# Patient Record
Sex: Female | Born: 1950 | Race: White | Hispanic: No | Marital: Married | Smoking: Never smoker
Health system: Southern US, Community
[De-identification: ages and names within clinical notes are randomized; demographics above are authoritative.]

## PROBLEM LIST (undated history)

## (undated) DIAGNOSIS — M549 Dorsalgia, unspecified: Secondary | ICD-10-CM

## (undated) DIAGNOSIS — F32A Depression, unspecified: Secondary | ICD-10-CM

## (undated) DIAGNOSIS — F514 Sleep terrors [night terrors]: Secondary | ICD-10-CM

## (undated) DIAGNOSIS — R600 Localized edema: Secondary | ICD-10-CM

## (undated) DIAGNOSIS — M810 Age-related osteoporosis without current pathological fracture: Secondary | ICD-10-CM

## (undated) DIAGNOSIS — J189 Pneumonia, unspecified organism: Secondary | ICD-10-CM

## (undated) DIAGNOSIS — F429 Obsessive-compulsive disorder, unspecified: Secondary | ICD-10-CM

## (undated) DIAGNOSIS — Z86711 Personal history of pulmonary embolism: Secondary | ICD-10-CM

## (undated) DIAGNOSIS — M1990 Unspecified osteoarthritis, unspecified site: Secondary | ICD-10-CM

## (undated) DIAGNOSIS — M5030 Other cervical disc degeneration, unspecified cervical region: Secondary | ICD-10-CM

## (undated) DIAGNOSIS — I82409 Acute embolism and thrombosis of unspecified deep veins of unspecified lower extremity: Secondary | ICD-10-CM

## (undated) DIAGNOSIS — I10 Essential (primary) hypertension: Secondary | ICD-10-CM

## (undated) DIAGNOSIS — R0600 Dyspnea, unspecified: Secondary | ICD-10-CM

## (undated) DIAGNOSIS — F419 Anxiety disorder, unspecified: Secondary | ICD-10-CM

## (undated) DIAGNOSIS — R51 Headache: Secondary | ICD-10-CM

## (undated) DIAGNOSIS — F329 Major depressive disorder, single episode, unspecified: Secondary | ICD-10-CM

## (undated) DIAGNOSIS — F319 Bipolar disorder, unspecified: Secondary | ICD-10-CM

## (undated) DIAGNOSIS — E538 Deficiency of other specified B group vitamins: Secondary | ICD-10-CM

## (undated) DIAGNOSIS — K5792 Diverticulitis of intestine, part unspecified, without perforation or abscess without bleeding: Secondary | ICD-10-CM

## (undated) DIAGNOSIS — I2510 Atherosclerotic heart disease of native coronary artery without angina pectoris: Secondary | ICD-10-CM

## (undated) DIAGNOSIS — Z9289 Personal history of other medical treatment: Secondary | ICD-10-CM

## (undated) DIAGNOSIS — M25569 Pain in unspecified knee: Secondary | ICD-10-CM

## (undated) DIAGNOSIS — E119 Type 2 diabetes mellitus without complications: Secondary | ICD-10-CM

## (undated) DIAGNOSIS — S8290XK Unspecified fracture of unspecified lower leg, subsequent encounter for closed fracture with nonunion: Secondary | ICD-10-CM

## (undated) DIAGNOSIS — J449 Chronic obstructive pulmonary disease, unspecified: Secondary | ICD-10-CM

## (undated) DIAGNOSIS — Z8489 Family history of other specified conditions: Secondary | ICD-10-CM

## (undated) DIAGNOSIS — Z8619 Personal history of other infectious and parasitic diseases: Secondary | ICD-10-CM

## (undated) DIAGNOSIS — K5790 Diverticulosis of intestine, part unspecified, without perforation or abscess without bleeding: Secondary | ICD-10-CM

## (undated) DIAGNOSIS — F4310 Post-traumatic stress disorder, unspecified: Secondary | ICD-10-CM

## (undated) DIAGNOSIS — R519 Headache, unspecified: Secondary | ICD-10-CM

## (undated) DIAGNOSIS — R42 Dizziness and giddiness: Secondary | ICD-10-CM

## (undated) HISTORY — PX: TOOTH EXTRACTION: SUR596

## (undated) HISTORY — PX: CERVICAL LAMINOPLASTY: SHX1333

## (undated) HISTORY — PX: KNEE ARTHROSCOPY: SUR90

## (undated) HISTORY — PX: FRACTURE SURGERY: SHX138

## (undated) HISTORY — PX: IVC FILTER PLACEMENT (ARMC HX): HXRAD1551

## (undated) HISTORY — PX: DILATION AND CURETTAGE OF UTERUS: SHX78

## (undated) HISTORY — PX: CHOLECYSTECTOMY: SHX55

## (undated) HISTORY — PX: NECK SURGERY: SHX720

---

## 2002-09-14 HISTORY — PX: CARDIAC CATHETERIZATION: SHX172

## 2002-09-14 HISTORY — PX: GASTRIC BYPASS: SHX52

## 2008-05-30 ENCOUNTER — Emergency Department: Payer: Self-pay | Admitting: Emergency Medicine

## 2008-05-30 ENCOUNTER — Other Ambulatory Visit: Payer: Self-pay

## 2010-08-26 ENCOUNTER — Emergency Department: Payer: Self-pay | Admitting: Internal Medicine

## 2010-09-12 ENCOUNTER — Encounter
Admission: RE | Admit: 2010-09-12 | Discharge: 2010-09-12 | Payer: Self-pay | Source: Home / Self Care | Attending: Orthopedic Surgery | Admitting: Orthopedic Surgery

## 2011-01-09 ENCOUNTER — Ambulatory Visit: Payer: Self-pay | Admitting: Orthopedic Surgery

## 2011-07-29 ENCOUNTER — Emergency Department: Payer: Self-pay | Admitting: Emergency Medicine

## 2011-08-05 ENCOUNTER — Emergency Department: Payer: Self-pay | Admitting: Emergency Medicine

## 2011-08-26 ENCOUNTER — Emergency Department: Payer: Self-pay | Admitting: Emergency Medicine

## 2011-10-27 ENCOUNTER — Emergency Department: Payer: Self-pay | Admitting: Emergency Medicine

## 2011-10-27 LAB — COMPREHENSIVE METABOLIC PANEL
Albumin: 3.6 g/dL (ref 3.4–5.0)
Alkaline Phosphatase: 112 U/L (ref 50–136)
BUN: 23 mg/dL — ABNORMAL HIGH (ref 7–18)
Calcium, Total: 8.6 mg/dL (ref 8.5–10.1)
Creatinine: 1.06 mg/dL (ref 0.60–1.30)
EGFR (Non-African Amer.): 57 — ABNORMAL LOW
Glucose: 129 mg/dL — ABNORMAL HIGH (ref 65–99)
Osmolality: 279 (ref 275–301)
Sodium: 137 mmol/L (ref 136–145)

## 2011-10-27 LAB — CBC
MCH: 32.5 pg (ref 26.0–34.0)
MCHC: 33.9 g/dL (ref 32.0–36.0)
MCV: 96 fL (ref 80–100)
Platelet: 239 10*3/uL (ref 150–440)
RDW: 12.7 % (ref 11.5–14.5)
WBC: 6.9 10*3/uL (ref 3.6–11.0)

## 2011-10-27 LAB — URINALYSIS, COMPLETE
Bacteria: NONE SEEN
Blood: NEGATIVE
Glucose,UR: NEGATIVE mg/dL (ref 0–75)
Leukocyte Esterase: NEGATIVE
Nitrite: NEGATIVE
RBC,UR: NONE SEEN /HPF (ref 0–5)

## 2011-10-27 LAB — VALPROIC ACID LEVEL: Valproic Acid: 38 ug/mL — ABNORMAL LOW

## 2011-10-27 LAB — TROPONIN I
Troponin-I: <0.02 ng/mL
Troponin-I: <0.02 ng/mL

## 2012-01-21 ENCOUNTER — Other Ambulatory Visit: Payer: Self-pay | Admitting: Orthopedic Surgery

## 2012-01-21 DIAGNOSIS — M545 Low back pain: Secondary | ICD-10-CM

## 2012-01-28 ENCOUNTER — Ambulatory Visit
Admission: RE | Admit: 2012-01-28 | Discharge: 2012-01-28 | Disposition: A | Discharge status: Home / Self Care | Payer: Medicare Other | Source: Ambulatory Visit | Attending: Orthopedic Surgery | Admitting: Orthopedic Surgery

## 2012-01-28 DIAGNOSIS — M545 Low back pain: Secondary | ICD-10-CM

## 2012-06-03 ENCOUNTER — Ambulatory Visit: Payer: Self-pay | Admitting: Bariatrics

## 2012-06-07 ENCOUNTER — Ambulatory Visit: Payer: Self-pay | Admitting: Bariatrics

## 2012-06-07 LAB — COMPREHENSIVE METABOLIC PANEL
Bilirubin,Total: 0.4 mg/dL (ref 0.2–1.0)
Calcium, Total: 9.1 mg/dL (ref 8.5–10.1)
Chloride: 106 mmol/L (ref 98–107)
Co2: 27 mmol/L (ref 21–32)
Creatinine: 0.93 mg/dL (ref 0.60–1.30)
EGFR (African American): >60
EGFR (Non-African Amer.): >60
Osmolality: 286 (ref 275–301)
SGPT (ALT): 34 U/L (ref 12–78)
Sodium: 142 mmol/L (ref 136–145)

## 2012-06-07 LAB — IRON AND TIBC
Iron Saturation: 31 %
Iron: 97 ug/dL (ref 50–170)
Unbound Iron-Bind.Cap.: 221 ug/dL

## 2012-06-07 LAB — PROTIME-INR: INR: 1

## 2012-06-07 LAB — CBC WITH DIFFERENTIAL/PLATELET
Basophil #: 0 10*3/uL (ref 0.0–0.1)
Basophil %: 0.5 %
Lymphocyte %: 22.6 %
Monocyte %: 5.3 %
Neutrophil #: 5.5 10*3/uL (ref 1.4–6.5)
Neutrophil %: 70.9 %
Platelet: 310 10*3/uL (ref 150–440)
RBC: 4.01 10*6/uL (ref 3.80–5.20)
WBC: 7.8 10*3/uL (ref 3.6–11.0)

## 2012-06-07 LAB — FERRITIN: Ferritin (ARMC): 97 ng/mL (ref 8–388)

## 2012-06-07 LAB — APTT: Activated PTT: 29.4 secs (ref 23.6–35.9)

## 2012-06-07 LAB — HEMOGLOBIN A1C: Hemoglobin A1C: 7.2 % — ABNORMAL HIGH (ref 4.2–6.3)

## 2012-06-07 LAB — TSH: Thyroid Stimulating Horm: 4.24 u[IU]/mL

## 2012-06-07 LAB — AMYLASE: Amylase: 41 U/L (ref 25–115)

## 2012-06-07 LAB — PHOSPHORUS: Phosphorus: 3.4 mg/dL (ref 2.5–4.9)

## 2012-07-06 ENCOUNTER — Other Ambulatory Visit: Payer: Self-pay | Admitting: Pain Medicine

## 2012-07-06 ENCOUNTER — Ambulatory Visit: Payer: Self-pay | Admitting: Pain Medicine

## 2012-07-20 ENCOUNTER — Ambulatory Visit: Payer: Self-pay | Admitting: Bariatrics

## 2012-08-01 ENCOUNTER — Ambulatory Visit: Payer: Self-pay | Admitting: Pain Medicine

## 2012-08-14 ENCOUNTER — Ambulatory Visit: Payer: Self-pay | Admitting: Bariatrics

## 2012-09-14 HISTORY — PX: LAMINECTOMY: SHX219

## 2012-09-28 ENCOUNTER — Other Ambulatory Visit: Payer: Self-pay | Admitting: Pain Medicine

## 2012-09-28 ENCOUNTER — Other Ambulatory Visit: Payer: Self-pay | Admitting: Psychiatry

## 2012-09-28 LAB — HEPATIC FUNCTION PANEL A (ARMC)
Albumin: 4.1 g/dL (ref 3.4–5.0)
Bilirubin, Direct: 0.1 mg/dL (ref 0.00–0.20)
Bilirubin,Total: 0.8 mg/dL (ref 0.2–1.0)
SGOT(AST): 51 U/L — ABNORMAL HIGH (ref 15–37)
SGPT (ALT): 42 U/L (ref 12–78)
Total Protein: 7.6 g/dL (ref 6.4–8.2)

## 2012-09-28 LAB — TSH: Thyroid Stimulating Horm: 3.17 u[IU]/mL

## 2012-10-03 ENCOUNTER — Ambulatory Visit: Payer: Self-pay | Admitting: Pain Medicine

## 2012-10-24 ENCOUNTER — Other Ambulatory Visit: Payer: Self-pay | Admitting: Pain Medicine

## 2012-10-31 ENCOUNTER — Ambulatory Visit: Payer: Self-pay | Admitting: Pain Medicine

## 2013-01-23 ENCOUNTER — Ambulatory Visit: Payer: Self-pay | Admitting: Pain Medicine

## 2013-02-20 DIAGNOSIS — F319 Bipolar disorder, unspecified: Secondary | ICD-10-CM | POA: Diagnosis present

## 2013-02-20 DIAGNOSIS — M5090 Cervical disc disorder, unspecified, unspecified cervical region: Secondary | ICD-10-CM | POA: Insufficient documentation

## 2013-02-20 DIAGNOSIS — E119 Type 2 diabetes mellitus without complications: Secondary | ICD-10-CM

## 2013-02-20 DIAGNOSIS — E669 Obesity, unspecified: Secondary | ICD-10-CM | POA: Insufficient documentation

## 2013-02-20 DIAGNOSIS — Z9884 Bariatric surgery status: Secondary | ICD-10-CM | POA: Insufficient documentation

## 2013-02-20 DIAGNOSIS — F1190 Opioid use, unspecified, uncomplicated: Secondary | ICD-10-CM | POA: Insufficient documentation

## 2013-05-02 ENCOUNTER — Other Ambulatory Visit: Payer: Self-pay | Admitting: Physician Assistant

## 2013-05-02 DIAGNOSIS — M5416 Radiculopathy, lumbar region: Secondary | ICD-10-CM

## 2013-05-16 ENCOUNTER — Ambulatory Visit
Admission: RE | Admit: 2013-05-16 | Discharge: 2013-05-16 | Disposition: A | Discharge status: Home / Self Care | Payer: Medicare Other | Source: Ambulatory Visit | Attending: Physician Assistant | Admitting: Physician Assistant

## 2013-05-16 DIAGNOSIS — M5416 Radiculopathy, lumbar region: Secondary | ICD-10-CM

## 2013-06-22 ENCOUNTER — Other Ambulatory Visit: Payer: Self-pay | Admitting: Physician Assistant

## 2013-06-22 DIAGNOSIS — IMO0002 Reserved for concepts with insufficient information to code with codable children: Secondary | ICD-10-CM

## 2013-07-01 ENCOUNTER — Ambulatory Visit
Admission: RE | Admit: 2013-07-01 | Discharge: 2013-07-01 | Disposition: A | Discharge status: Home / Self Care | Payer: Medicare Other | Source: Ambulatory Visit | Attending: Physician Assistant | Admitting: Physician Assistant

## 2013-07-01 DIAGNOSIS — IMO0002 Reserved for concepts with insufficient information to code with codable children: Secondary | ICD-10-CM

## 2013-07-01 MED ORDER — GADOBENATE DIMEGLUMINE 529 MG/ML IV SOLN
20.0000 mL | Freq: Once | INTRAVENOUS | Status: AC | PRN
  Administered 2013-07-01: 20 mL via INTRAVENOUS

## 2013-09-14 HISTORY — PX: JOINT REPLACEMENT: SHX530

## 2013-10-09 ENCOUNTER — Emergency Department: Payer: Self-pay | Admitting: Emergency Medicine

## 2013-10-22 ENCOUNTER — Emergency Department: Payer: Self-pay | Admitting: Emergency Medicine

## 2013-10-22 LAB — DRUG SCREEN, URINE

## 2013-10-22 LAB — URINALYSIS, COMPLETE
BLOOD: NEGATIVE
Bacteria: NEGATIVE
Bilirubin,UR: NEGATIVE
Glucose,UR: NEGATIVE mg/dL (ref 0–75)
Ketone: NEGATIVE
Leukocyte Esterase: NEGATIVE
Nitrite: NEGATIVE
PH: 6 (ref 4.5–8.0)
Protein: NEGATIVE
RBC, UR: NONE SEEN /HPF (ref 0–5)
Specific Gravity: 1.013 (ref 1.003–1.030)

## 2013-10-22 LAB — COMPREHENSIVE METABOLIC PANEL
Albumin: 3.3 g/dL — ABNORMAL LOW (ref 3.4–5.0)
Alkaline Phosphatase: 145 U/L — ABNORMAL HIGH
Anion Gap: 6 — ABNORMAL LOW (ref 7–16)
BUN: 14 mg/dL (ref 7–18)
Bilirubin,Total: 0.2 mg/dL (ref 0.2–1.0)
CREATININE: 0.91 mg/dL (ref 0.60–1.30)
Calcium, Total: 8.6 mg/dL (ref 8.5–10.1)
Chloride: 100 mmol/L (ref 98–107)
Co2: 27 mmol/L (ref 21–32)
EGFR (African American): >60
EGFR (Non-African Amer.): >60
Glucose: 109 mg/dL — ABNORMAL HIGH (ref 65–99)
Osmolality: 267 (ref 275–301)
Potassium: 4.1 mmol/L (ref 3.5–5.1)
SGOT(AST): 29 U/L (ref 15–37)
SGPT (ALT): 22 U/L (ref 12–78)
Sodium: 133 mmol/L — ABNORMAL LOW (ref 136–145)
Total Protein: 7.3 g/dL (ref 6.4–8.2)

## 2013-10-22 LAB — CBC
HCT: 39.6 % (ref 35.0–47.0)
HGB: 13.2 g/dL (ref 12.0–16.0)
MCH: 31.4 pg (ref 26.0–34.0)
MCHC: 33.3 g/dL (ref 32.0–36.0)
MCV: 94 fL (ref 80–100)
Platelet: 229 10*3/uL (ref 150–440)
RBC: 4.21 10*6/uL (ref 3.80–5.20)
RDW: 13.2 % (ref 11.5–14.5)
WBC: 8.5 10*3/uL (ref 3.6–11.0)

## 2013-10-24 ENCOUNTER — Other Ambulatory Visit: Payer: Self-pay | Admitting: Physician Assistant

## 2013-10-24 DIAGNOSIS — IMO0002 Reserved for concepts with insufficient information to code with codable children: Secondary | ICD-10-CM

## 2013-10-31 ENCOUNTER — Other Ambulatory Visit: Payer: Medicare Other

## 2013-11-13 ENCOUNTER — Ambulatory Visit: Payer: Self-pay | Admitting: Pain Medicine

## 2013-11-16 ENCOUNTER — Other Ambulatory Visit: Payer: Medicare Other

## 2013-11-28 ENCOUNTER — Ambulatory Visit
Admission: RE | Admit: 2013-11-28 | Discharge: 2013-11-28 | Disposition: A | Discharge status: Home / Self Care | Payer: Medicare Other | Source: Ambulatory Visit | Attending: Physician Assistant | Admitting: Physician Assistant

## 2013-11-28 DIAGNOSIS — IMO0002 Reserved for concepts with insufficient information to code with codable children: Secondary | ICD-10-CM

## 2013-11-28 MED ORDER — GADOBENATE DIMEGLUMINE 529 MG/ML IV SOLN
20.0000 mL | Freq: Once | INTRAVENOUS | Status: AC | PRN
  Administered 2013-11-28: 20 mL via INTRAVENOUS

## 2013-12-28 ENCOUNTER — Emergency Department: Payer: Self-pay | Admitting: Emergency Medicine

## 2014-01-14 ENCOUNTER — Emergency Department: Payer: Self-pay | Admitting: Emergency Medicine

## 2014-01-15 ENCOUNTER — Ambulatory Visit: Payer: Self-pay | Admitting: Urology

## 2014-01-15 LAB — CBC
HCT: 39.6 % (ref 35.0–47.0)
HGB: 12.9 g/dL (ref 12.0–16.0)
MCH: 32.4 pg (ref 26.0–34.0)
MCHC: 32.6 g/dL (ref 32.0–36.0)
MCV: 99 fL (ref 80–100)
PLATELETS: 215 10*3/uL (ref 150–440)
RBC: 3.99 10*6/uL (ref 3.80–5.20)
RDW: 13.6 % (ref 11.5–14.5)
WBC: 10.8 10*3/uL (ref 3.6–11.0)

## 2014-01-15 LAB — BASIC METABOLIC PANEL
ANION GAP: 9 (ref 7–16)
BUN: 25 mg/dL — ABNORMAL HIGH (ref 7–18)
Calcium, Total: 9.3 mg/dL (ref 8.5–10.1)
Chloride: 97 mmol/L — ABNORMAL LOW (ref 98–107)
Co2: 28 mmol/L (ref 21–32)
Creatinine: 0.85 mg/dL (ref 0.60–1.30)
EGFR (African American): >60
EGFR (Non-African Amer.): >60
Glucose: 249 mg/dL — ABNORMAL HIGH (ref 65–99)
Osmolality: 281 (ref 275–301)
Potassium: 5.2 mmol/L — ABNORMAL HIGH (ref 3.5–5.1)
Sodium: 134 mmol/L — ABNORMAL LOW (ref 136–145)

## 2014-01-16 DIAGNOSIS — S82143A Displaced bicondylar fracture of unspecified tibia, initial encounter for closed fracture: Secondary | ICD-10-CM | POA: Insufficient documentation

## 2014-02-15 DIAGNOSIS — I2699 Other pulmonary embolism without acute cor pulmonale: Secondary | ICD-10-CM | POA: Insufficient documentation

## 2014-03-30 DIAGNOSIS — M79606 Pain in leg, unspecified: Secondary | ICD-10-CM | POA: Insufficient documentation

## 2014-04-24 DIAGNOSIS — G8929 Other chronic pain: Secondary | ICD-10-CM | POA: Insufficient documentation

## 2014-04-24 DIAGNOSIS — M25562 Pain in left knee: Secondary | ICD-10-CM

## 2014-04-24 DIAGNOSIS — M545 Low back pain, unspecified: Secondary | ICD-10-CM | POA: Insufficient documentation

## 2014-04-24 DIAGNOSIS — M5090 Cervical disc disorder, unspecified, unspecified cervical region: Secondary | ICD-10-CM | POA: Insufficient documentation

## 2014-04-24 DIAGNOSIS — M25561 Pain in right knee: Secondary | ICD-10-CM

## 2014-04-25 ENCOUNTER — Observation Stay: Payer: Self-pay | Admitting: Family Medicine

## 2014-04-25 LAB — APTT: Activated PTT: 41.7 secs — ABNORMAL HIGH (ref 23.6–35.9)

## 2014-04-25 LAB — BASIC METABOLIC PANEL
ANION GAP: 4 — AB (ref 7–16)
BUN: 15 mg/dL (ref 7–18)
CALCIUM: 8.7 mg/dL (ref 8.5–10.1)
Chloride: 102 mmol/L (ref 98–107)
Co2: 32 mmol/L (ref 21–32)
Creatinine: 0.91 mg/dL (ref 0.60–1.30)
EGFR (African American): >60
Glucose: 203 mg/dL — ABNORMAL HIGH (ref 65–99)
Osmolality: 282 (ref 275–301)
Potassium: 4.8 mmol/L (ref 3.5–5.1)
SODIUM: 138 mmol/L (ref 136–145)

## 2014-04-25 LAB — CBC
HCT: 36.4 % (ref 35.0–47.0)
HGB: 11.9 g/dL — ABNORMAL LOW (ref 12.0–16.0)
MCH: 31.3 pg (ref 26.0–34.0)
MCHC: 32.8 g/dL (ref 32.0–36.0)
MCV: 96 fL (ref 80–100)
PLATELETS: 203 10*3/uL (ref 150–440)
RBC: 3.81 10*6/uL (ref 3.80–5.20)
RDW: 13.7 % (ref 11.5–14.5)
WBC: 5.5 10*3/uL (ref 3.6–11.0)

## 2014-04-25 LAB — CK TOTAL AND CKMB (NOT AT ARMC)
CK, Total: 23 U/L — ABNORMAL LOW
CK, Total: 21 U/L — ABNORMAL LOW
CK-MB: <0.5 ng/mL — ABNORMAL LOW (ref 0.5–3.6)
CK-MB: <0.5 ng/mL — ABNORMAL LOW (ref 0.5–3.6)

## 2014-04-25 LAB — PROTIME-INR
INR: 1
PROTHROMBIN TIME: 12.9 s (ref 11.5–14.7)

## 2014-04-25 LAB — TROPONIN I
Troponin-I: <0.02 ng/mL
Troponin-I: <0.02 ng/mL
Troponin-I: <0.02 ng/mL

## 2014-04-26 LAB — CBC WITH DIFFERENTIAL/PLATELET
Basophil #: 0 10*3/uL (ref 0.0–0.1)
Basophil %: 0.6 %
EOS ABS: 0.1 10*3/uL (ref 0.0–0.7)
Eosinophil %: 1 %
HCT: 36.7 % (ref 35.0–47.0)
HGB: 11.9 g/dL — ABNORMAL LOW (ref 12.0–16.0)
Lymphocyte #: 2.6 10*3/uL (ref 1.0–3.6)
Lymphocyte %: 44.4 %
MCH: 31 pg (ref 26.0–34.0)
MCHC: 32.4 g/dL (ref 32.0–36.0)
MCV: 96 fL (ref 80–100)
MONO ABS: 0.3 x10 3/mm (ref 0.2–0.9)
Monocyte %: 4.8 %
NEUTROS ABS: 2.9 10*3/uL (ref 1.4–6.5)
Neutrophil %: 49.2 %
PLATELETS: 204 10*3/uL (ref 150–440)
RBC: 3.83 10*6/uL (ref 3.80–5.20)
RDW: 13.7 % (ref 11.5–14.5)
WBC: 5.9 10*3/uL (ref 3.6–11.0)

## 2014-04-26 LAB — BASIC METABOLIC PANEL
Anion Gap: 10 (ref 7–16)
BUN: 17 mg/dL (ref 7–18)
Calcium, Total: 8.9 mg/dL (ref 8.5–10.1)
Chloride: 101 mmol/L (ref 98–107)
Co2: 30 mmol/L (ref 21–32)
Creatinine: 0.9 mg/dL (ref 0.60–1.30)
GLUCOSE: 162 mg/dL — AB (ref 65–99)
Osmolality: 286 (ref 275–301)
Potassium: 4.2 mmol/L (ref 3.5–5.1)
Sodium: 141 mmol/L (ref 136–145)

## 2014-04-26 LAB — TSH: Thyroid Stimulating Horm: 2.48 u[IU]/mL

## 2014-04-26 LAB — LIPID PANEL
Cholesterol: 232 mg/dL — ABNORMAL HIGH (ref 0–200)
HDL: 23 mg/dL — AB (ref 40–60)
TRIGLYCERIDES: 739 mg/dL — AB (ref 0–200)

## 2014-05-28 ENCOUNTER — Other Ambulatory Visit: Payer: Self-pay | Admitting: Physician Assistant

## 2014-05-28 DIAGNOSIS — R32 Unspecified urinary incontinence: Secondary | ICD-10-CM

## 2014-05-28 DIAGNOSIS — M5412 Radiculopathy, cervical region: Secondary | ICD-10-CM

## 2014-05-28 DIAGNOSIS — M5416 Radiculopathy, lumbar region: Secondary | ICD-10-CM

## 2014-05-31 DIAGNOSIS — M5416 Radiculopathy, lumbar region: Secondary | ICD-10-CM | POA: Insufficient documentation

## 2014-06-07 ENCOUNTER — Ambulatory Visit
Admission: RE | Admit: 2014-06-07 | Discharge: 2014-06-07 | Disposition: A | Discharge status: Home / Self Care | Payer: Medicare Other | Source: Ambulatory Visit | Attending: Physician Assistant | Admitting: Physician Assistant

## 2014-06-07 DIAGNOSIS — R32 Unspecified urinary incontinence: Secondary | ICD-10-CM

## 2014-06-07 DIAGNOSIS — M5412 Radiculopathy, cervical region: Secondary | ICD-10-CM

## 2014-06-07 DIAGNOSIS — M5416 Radiculopathy, lumbar region: Secondary | ICD-10-CM

## 2014-06-07 MED ORDER — GADOBENATE DIMEGLUMINE 529 MG/ML IV SOLN
20.0000 mL | Freq: Once | INTRAVENOUS | Status: AC | PRN
  Administered 2014-06-07: 20 mL via INTRAVENOUS

## 2014-07-09 ENCOUNTER — Other Ambulatory Visit: Payer: Self-pay | Admitting: Physician Assistant

## 2014-07-09 DIAGNOSIS — M5416 Radiculopathy, lumbar region: Secondary | ICD-10-CM

## 2014-07-09 DIAGNOSIS — M5116 Intervertebral disc disorders with radiculopathy, lumbar region: Secondary | ICD-10-CM

## 2014-07-19 ENCOUNTER — Inpatient Hospital Stay: Admission: RE | Admit: 2014-07-19 | Payer: Medicare Other | Source: Ambulatory Visit

## 2014-07-19 ENCOUNTER — Other Ambulatory Visit: Payer: Self-pay

## 2014-07-27 ENCOUNTER — Other Ambulatory Visit: Payer: Self-pay | Admitting: Physician Assistant

## 2014-07-27 DIAGNOSIS — M5415 Radiculopathy, thoracolumbar region: Secondary | ICD-10-CM

## 2014-07-30 ENCOUNTER — Ambulatory Visit
Admission: RE | Admit: 2014-07-30 | Discharge: 2014-07-30 | Disposition: A | Discharge status: Home / Self Care | Payer: Medicare Other | Source: Ambulatory Visit | Attending: Physician Assistant | Admitting: Physician Assistant

## 2014-07-30 DIAGNOSIS — M5415 Radiculopathy, thoracolumbar region: Secondary | ICD-10-CM

## 2014-07-30 MED ORDER — GADOBENATE DIMEGLUMINE 529 MG/ML IV SOLN
20.0000 mL | Freq: Once | INTRAVENOUS | Status: AC | PRN
  Administered 2014-07-30: 20 mL via INTRAVENOUS

## 2014-07-30 MED ORDER — GADOBENATE DIMEGLUMINE 529 MG/ML IV SOLN: 20.0000 mL | Freq: Once | INTRAVENOUS | Status: AC | PRN

## 2014-08-23 DIAGNOSIS — M1730 Unilateral post-traumatic osteoarthritis, unspecified knee: Secondary | ICD-10-CM | POA: Insufficient documentation

## 2014-09-14 DIAGNOSIS — Z86711 Personal history of pulmonary embolism: Secondary | ICD-10-CM

## 2014-09-14 HISTORY — DX: Personal history of pulmonary embolism: Z86.711

## 2014-10-12 ENCOUNTER — Ambulatory Visit: Payer: Self-pay | Admitting: Psychiatry

## 2014-10-12 ENCOUNTER — Emergency Department: Payer: Self-pay | Admitting: Emergency Medicine

## 2014-10-12 LAB — HEPATIC FUNCTION PANEL A (ARMC)
Albumin: 3.2 g/dL — ABNORMAL LOW (ref 3.4–5.0)
Alkaline Phosphatase: 154 U/L — ABNORMAL HIGH (ref 46–116)
Bilirubin,Total: 0.2 mg/dL (ref 0.2–1.0)
SGOT(AST): 27 U/L (ref 15–37)
SGPT (ALT): 42 U/L (ref 14–63)
TOTAL PROTEIN: 6.9 g/dL (ref 6.4–8.2)

## 2014-10-12 LAB — VALPROIC ACID LEVEL: VALPROIC ACID: 37 ug/mL — AB

## 2014-10-26 DIAGNOSIS — F3162 Bipolar disorder, current episode mixed, moderate: Secondary | ICD-10-CM | POA: Insufficient documentation

## 2015-01-05 NOTE — H&P (Signed)
PATIENT NAME:  Amy Sweeney, Amy Sweeney MR#:  [id number] DATE OF BIRTH:  [birth year]  DATE OF ADMISSION:  04/25/2014  PRIMARY CARE PROVIDER: Dr.  Cindi CarbonEFERRING PHYSICIAN: Dr. Dorothea GlassmanMalinda Paul  CHIEF COMPLAINT: Chest pain.   HISTORY OF PRESENT ILLNESS: The patient is a 64 year old white female with history of right tibial fracture since May who [facility] sort of [facility], which is unclear[facility] and was diagnosed with a PE in June 2015, has been on Lovenox under their hematologist's direction and orthopedic direction, who states she did not feel well this morning when she woke up. She started having pain in the right side of her shoulder and then pain started going towards her substernal area of the chest. The patient came in with these symptoms. She also had shortness of breath. Initially she felt like this was similar to her pulmonary embolism. She came to the ED and had a CT per PE protocol which showed no evidence of PE. Only a significant coronary artery calcification was noted. The patient's pain is currently improved. She denies any nausea, vomiting, or tingling in her hand. Denies any GI symptoms, including abdominal pain. She has pain in her right leg due to the right tibial fracture and is still bed bound.   PAST MEDICAL HISTORY: 1.  History of provoke PE.  2.  Helicobacter pylori.  3.  History of hypertension.  4.  Degenerative disk disease.  5.  Anxiety and depression.  6.  Bipolar disorder.  7.  Osteoporosis.  8.  She reports a history of diabetes.   PAST SURGICAL HISTORY: Status post cholecystectomy, laminectomy, gastric ulcer surgery, neck surgery, D and C, C-section, left knee arthroscopy, as well as right-sided leg surgery.   ALLERGIES: MORPHINE, TRAMADOL, TEGADERM, BEE STING.   CURRENT MEDICATIONS: She is on nitroglycerin 0.4 sublingual p.r.n., gabapentin 300 mg 3 tabs 4 times a day, Flonase 50 mcg 1 spray b.i.d., fentanyl  62 mcg every 72 hours, Depakote 500 mg 1 tab p.o.  b.i.d., Cymbalta 120 daily, clonazepam 1 mg 4 times a day as needed.   SOCIAL HISTORY: Does not smoke. Drinks occasionally on holidays. No drug use.   FAMILY HISTORY: Problems with the heart. She is not able to give details about that. History of hypertension.   REVIEW OF SYSTEMS:  GENERAL: Denies any fevers, chills. No weight loss. No weight gain.  HEENT: Denies any blurred vision or double vision. No cataracts, glaucoma. Denies any nasal drainage. Does have allergies. No difficulty swallowing. No ringing in the ear. No difficulty with hearing.  CARDIOVASCULAR: Complains of chest pressure. Is not ambulatory. Denies any dyspnea on exertion. Did have some shortness of breath. No syncope. No arrhythmias.  PULMONARY: Denies any cough, wheezing, hemoptysis. No COPD.  GASTROINTESTINAL: Denies any nausea, vomiting, diarrhea. No abdominal pain.  GENITOURINARY: Denies any frequency, urgency, or hesitancy.  ENDOCRINE: Denies polyuria or polydipsia.  SKIN: No rash.  LYMPHATICS: No lymph node enlargement.  MUSCULOSKELETAL: Complains of pain in the right leg due to a fracture.  NEUROLOGIC: Denies any CVA, TIA, or seizure.  PSYCHIATRIC: Has a history of anxiety and bipolar disorder.   PHYSICAL EXAMINATION: VITAL SIGNS: Temperature 98.2, pulse 64, respirations 14, blood pressure 129/81, O2 of 96% on room air.  GENERAL: The patient is a well-developed female, obese, in no acute distress.  HEENT: Head atraumatic, normocephalic. Pupils equally round, reactive to light and accommodation. There is no conjunctival pallor. No sclera icterus. Nasal exam shows no drainage or ulceration. Oropharynx is  clear without any exudate.  Ears: no lesion, no drainge NECK: Supple without any JVD. thyroid non palpable CARDIOVASCULAR: Regular rate and rhythm. No murmurs, rubs, clicks, or gallops.  LUNGS: Clear to auscultation bilaterally without any rales, rhonchi, wheezing.  ABDOMEN: Soft, nontender, nondistended. Positive  bowel sounds x 4.  no hepatosplenomegal EXTREMITIES: No clubbing, cyanosis, or edema.  SKIN: No rash. , no lesions LYMPHATICS: No lymph nodes palpable.  VASCULAR: Good DP, PT pulses.  PSYCHIATRIC: Not anxious or depressed. NEUROLOGIC: Awake, alert, oriented x 3. no focal defecits, strenght 5/5 all ext  DIAGNOSTIC DATA: EKG showed normal sinus rhythm without any ST-T wave changes. CT of the chest for PE negative for PE. There are calcifications in the LAD and scattered in the RCA. Troponin less than 0.02. WBC 5.5, hemoglobin 11.9, platelet count 203. Glucose 203, BUN 15, creatinine 0.91, sodium 138, potassium 4.8, chloride 102, CO2 of 32.   ASSESSMENT AND PLAN: The patient is a 64 year old who presents with chest pain, felt to be atypical in nature.  1.  Chest pain, atypical in nature. Will check serial cardiac enzymes, do a Lexiscan MIBI in the morning, and place her on aspirin.  2.  History of pulmonary embolus. Continued Lovenox full dose as taking at home.  3.  Anxiety and depression. Continue treatment as doing at home with clonazepam, Depakote and duloxetine.  4.  Tibial fracture. Continue pain control as doing at home.   TIME SPENT ON THIS HISTORY OF PRESENT ILLNESS: 45 minutes.    ____________________________ Lacie Scotts Allena Katz, MD shp:at D: 04/25/2014 18:07:15 ET T: 04/25/2014 18:24:01 ET JOB#: [id number]  cc: Soriyah Osberg H. Allena Katz, MD, <Dictator> Charise Carwin MD ELECTRONICALLY SIGNED 05/01/2014 14:22

## 2015-01-05 NOTE — Consult Note (Signed)
Admit Diagnosis:   EMS R KNEE PAIN: Onset Date: 14-Jan-2014, Status: Active, Description: EMS R KNEE PAIN    Gallbladder Removed:    lamiminectomy:    Stomach ulcers:    H. Pylori:    H/o HTN:    Degenerative Disc Disease:    obesity:    anxiety:    depression:    H/o migraines:    BiPolar:    NIDDM:    Gastric Bypass:    Neck Surgery:    Cholecystectomy:    Neck surgery:    C-Section:    Left knee arthroscopy:    Entire stomach removed:    D&C - Dilation and Curretage:   Home Medications: Medication Instructions Status  oxycodone 10 mg oral tablet 1 tab(s) orally 1 to 3 times a day, As Needed - for Pain Active  Vitamin D3 2000 intl units tablet 1 tab(s) orally once a day x 30 days Active  Vitamin D2 50,000 intl units capsule 1 cap(s) orally 2 times a week x 45 days Active  gabapentin 300 mg oral capsule 1 to 3 cap(s) orally 1 to 4 times a day x 30 days. Start tappering the medication up slowly, as tolerated. Start with 1 cap. orally 1 to 3 times a day, and continue to increase up to 3 cap(s) orally 4 times a day as tolerated. Make changes in dose every 7 days to allow for your body to adapt to the medication. Active  clonazepam 1 mg  1   4 times a day Active  Calcium 600+D 600 mg-200 units oral tablet 1  orally once a day Active  magnesium with zinc 1   once a day Active  Depakote ER 500 mg oral tablet, extended release 1 tab(s) orally 4 times a day Active  Vitamin B-12 1000 mcg oral tablet 1 tab(s) orally once a day Active  iron   once a day Active  Benadryl 25 mg oral capsule 2 cap(s) orally 2 times a day Active  Flonase 50 mcg/inh nasal spray 2 spray(s) nasal once a day Active   Radiology Results:  Radiology Results: LabUnknown:    16-Apr-15 08:44, CT Lumbar Spine Without Contrast  PACS Image    Morphine: Unknown  Bee Stings: Unknown  tegaderm: Itching, Rash  Tramadol: Other   General Aspect 64 y/o WF in no acute distress on stretcher  in ED.   Present Illness 64 y/o WF who fell this afternoon slipping on towel at home. Hit knee and felt immediate pain. Progressive difficulty and pain with attempts of walking, brought by EMS to ED.   Case History and Physical Exam:  Chief Complaint right knee pain   Past Medical Health Diabetes Mellitus, Bipolar, anxiety, obesity, migraines, DDD, chronic pain syndrome   Past Surgical History s/p spine surgery, gastric bypass, cholecystectomy, c-section, D&C, left knee arthroscopy   [facility]   Family History osteoporosis   HEENT atraumatic, EOM intact, hearing intact   Chest/Lungs no use of accessory muscles, nonlabored breathing, RR   Breasts Not examined   Cardiovascular Normal Sinus Rhythm  good distal pulses   Abdomen Benign   Genitalia Not examined   Rectal Not examined   Musculoskeletal Swelling around knee, some bruising lateral aspect proximal tibia, no open wounds, SILT DP/SP/sural. EHL/FHL intact. No pain with passive ROM of toe. Good distal pulses.   Neurological Grossly WNL   Skin Warm  Dry    Impression 64 y/o WF with bicondylar tibial  plateau fracture whioch occured earlier today. No evidence of compartment syndrome.   Plan Will need to place in knee immobilizer in interim, compartment checks. Will need Medicine work-up for surgical clearance. Suggest transfer to dedicated trauma center, since local orthopaedic group may not be comfortable treating fracture pattern. Significant bone loss and depression on radiographs. Non-weightbearing to right lower extremity. NPO. iv insertion for pain control. Foley catheter.   Electronic Signatures: Freda Munro (MD)  (Signed [phone] 00:15)  Authored: Health Issues, Significant Events - History, Home Medications, Radiology Results, Allergies, General Aspect/Present Illness, History and Physical Exam, Impression/Plan   Last Updated: 04-May-15 00:15 by Freda Munro (MD)

## 2015-01-25 ENCOUNTER — Other Ambulatory Visit: Payer: Self-pay | Admitting: Orthopedic Surgery

## 2015-01-25 DIAGNOSIS — M12561 Traumatic arthropathy, right knee: Secondary | ICD-10-CM

## 2015-01-29 ENCOUNTER — Ambulatory Visit: Payer: Medicare Other

## 2015-01-30 ENCOUNTER — Ambulatory Visit: Payer: Medicare Other

## 2015-02-04 ENCOUNTER — Ambulatory Visit
Admission: RE | Admit: 2015-02-04 | Discharge: 2015-02-04 | Disposition: A | Discharge status: Home / Self Care | Payer: Medicare Other | Source: Ambulatory Visit | Attending: Orthopedic Surgery | Admitting: Orthopedic Surgery

## 2015-02-04 DIAGNOSIS — M1711 Unilateral primary osteoarthritis, right knee: Secondary | ICD-10-CM | POA: Insufficient documentation

## 2015-02-04 DIAGNOSIS — Z01818 Encounter for other preprocedural examination: Secondary | ICD-10-CM | POA: Diagnosis present

## 2015-02-04 DIAGNOSIS — M8580 Other specified disorders of bone density and structure, unspecified site: Secondary | ICD-10-CM | POA: Insufficient documentation

## 2015-02-04 DIAGNOSIS — M25461 Effusion, right knee: Secondary | ICD-10-CM | POA: Diagnosis not present

## 2015-02-04 DIAGNOSIS — M12561 Traumatic arthropathy, right knee: Secondary | ICD-10-CM

## 2015-02-20 ENCOUNTER — Inpatient Hospital Stay: Admission: RE | Admit: 2015-02-20 | Payer: Medicare Other | Source: Ambulatory Visit

## 2015-02-26 ENCOUNTER — Encounter: Admission: RE | Payer: Self-pay | Source: Ambulatory Visit

## 2015-02-26 ENCOUNTER — Ambulatory Visit: Admission: RE | Admit: 2015-02-26 | Payer: Medicare Other | Source: Ambulatory Visit | Admitting: Vascular Surgery

## 2015-02-26 SURGERY — IVC FILTER INSERTION
Anesthesia: Moderate Sedation

## 2015-02-27 ENCOUNTER — Telehealth: Payer: Self-pay | Admitting: Psychiatry

## 2015-03-05 ENCOUNTER — Ambulatory Visit: Admission: RE | Admit: 2015-03-05 | Payer: Medicare Other | Source: Ambulatory Visit | Admitting: Orthopedic Surgery

## 2015-03-05 ENCOUNTER — Encounter: Admission: RE | Payer: Self-pay | Source: Ambulatory Visit

## 2015-03-05 SURGERY — ARTHROPLASTY, KNEE, TOTAL
Anesthesia: Choice | Laterality: Right

## 2015-05-15 ENCOUNTER — Encounter
Admission: RE | Admit: 2015-05-15 | Discharge: 2015-05-15 | Disposition: A | Discharge status: Home / Self Care | Payer: Medicare Other | Source: Ambulatory Visit | Attending: Orthopedic Surgery | Admitting: Orthopedic Surgery

## 2015-05-15 DIAGNOSIS — Z01818 Encounter for other preprocedural examination: Secondary | ICD-10-CM | POA: Diagnosis present

## 2015-05-15 DIAGNOSIS — R8290 Unspecified abnormal findings in urine: Secondary | ICD-10-CM | POA: Diagnosis not present

## 2015-05-15 HISTORY — DX: Type 2 diabetes mellitus without complications: E11.9

## 2015-05-15 HISTORY — DX: Bipolar disorder, unspecified: F31.9

## 2015-05-15 HISTORY — DX: Unspecified osteoarthritis, unspecified site: M19.90

## 2015-05-15 HISTORY — DX: Family history of other specified conditions: Z84.89

## 2015-05-15 HISTORY — DX: Headache, unspecified: R51.9

## 2015-05-15 HISTORY — DX: Anxiety disorder, unspecified: F41.9

## 2015-05-15 HISTORY — DX: Headache: R51

## 2015-05-15 LAB — URINALYSIS COMPLETE WITH MICROSCOPIC (ARMC ONLY)
Bilirubin Urine: NEGATIVE
Glucose, UA: NEGATIVE mg/dL
HGB URINE DIPSTICK: NEGATIVE
Ketones, ur: NEGATIVE mg/dL
NITRITE: NEGATIVE
PROTEIN: NEGATIVE mg/dL
SPECIFIC GRAVITY, URINE: 1.005 (ref 1.005–1.030)
pH: 7 (ref 5.0–8.0)

## 2015-05-15 LAB — BASIC METABOLIC PANEL
ANION GAP: 13 (ref 5–15)
BUN: 17 mg/dL (ref 6–20)
CHLORIDE: 98 mmol/L — AB (ref 101–111)
CO2: 27 mmol/L (ref 22–32)
Calcium: 9.1 mg/dL (ref 8.9–10.3)
Creatinine, Ser: 0.7 mg/dL (ref 0.44–1.00)
GFR calc Af Amer: >60 mL/min (ref >60)
GLUCOSE: 74 mg/dL (ref 65–99)
POTASSIUM: 4.2 mmol/L (ref 3.5–5.1)
Sodium: 138 mmol/L (ref 135–145)

## 2015-05-15 LAB — CBC
HEMATOCRIT: 36.1 % (ref 35.0–47.0)
HEMOGLOBIN: 11.7 g/dL — AB (ref 12.0–16.0)
MCH: 31.7 pg (ref 26.0–34.0)
MCHC: 32.4 g/dL (ref 32.0–36.0)
MCV: 97.7 fL (ref 80.0–100.0)
PLATELETS: 198 10*3/uL (ref 150–440)
RBC: 3.69 MIL/uL — AB (ref 3.80–5.20)
RDW: 13.6 % (ref 11.5–14.5)
WBC: 5.6 10*3/uL (ref 3.6–11.0)

## 2015-05-15 LAB — SURGICAL PCR SCREEN
MRSA, PCR: NEGATIVE
STAPHYLOCOCCUS AUREUS: NEGATIVE

## 2015-05-15 LAB — PROTIME-INR
INR: 1.09
Prothrombin Time: 14.3 seconds (ref 11.4–15.0)

## 2015-05-15 LAB — SEDIMENTATION RATE: Sed Rate: 49 mm/hr — ABNORMAL HIGH (ref 0–30)

## 2015-05-15 LAB — TYPE AND SCREEN
ABO/RH(D): A POS
ANTIBODY SCREEN: NEGATIVE

## 2015-05-15 LAB — APTT: APTT: 42 s — AB (ref 24–36)

## 2015-05-15 LAB — ABO/RH: ABO/RH(D): A POS

## 2015-05-15 NOTE — Patient Instructions (Addendum)
  Your procedure is scheduled on: Thursday Sept. 15, 2016. Report to Same Day Surgery. To find out your arrival time please call [phone] between 1PM - 3PM on Wednesday Sept. 14, 2016.  Remember: Instructions that are not followed completely may result in serious medical risk, up to and including death, or upon the discretion of your surgeon and anesthesiologist your surgery may need to be rescheduled.    _x___ 1. Do not eat food or drink liquids after midnight. No gum chewing or hard candies.     ____ 2. No Alcohol for 24 hours before or after surgery.   ____ 3. Bring all medications with you on the day of surgery if instructed.    __x__ 4. Notify your doctor if there is any change in your medical condition     (cold, fever, infections).     Do not wear jewelry, make-up, hairpins, clips or nail polish.  Do not wear lotions, powders, or perfumes. You may wear deodorant.  Do not shave 48 hours prior to surgery. Men may shave face and neck.  Do not bring valuables to the hospital.    [facility] is not responsible for any belongings or valuables.               Contacts, dentures or bridgework may not be worn into surgery.  Leave your suitcase in the car. After surgery it may be brought to your room.  For patients admitted to the hospital, discharge time is determined by your treatment team.   Patients discharged the day of surgery will not be allowed to drive home.    Please read over the following fact sheets that you were given:   [facility] Preparing for Surgery  ____ Take these medicines the morning of surgery with A SIP OF WATER:    1. clonazePAM (KLONOPIN)  2. divalproex (DEPAKOTE  3. DULoxetine (CYMBALTA)  4.gabapentin (NEURONTIN)  5.Magnesium Oxide  6.Oxycodone if needed.  ____ Fleet Enema (as directed)   _x___ Use SAGE wipes night prior and morning of surgery.  ____ Use inhalers on the day of surgery  __x__ Stop metformin 2 days prior to surgery.    ____  Take 1/2 of usual insulin dose the night before surgery and none on the morning of surgery.   ____ Stop Coumadin/Plavix/aspirin on does not apply.  ____ Stop Anti-inflammatories on does not apply.   __x__ Stop supplements until after surgery Ginko 1 week prior to surgery.    ____ Bring C-Pap to the hospital.

## 2015-05-17 LAB — URINE CULTURE: Culture: >=100000

## 2015-05-17 NOTE — OR Nursing (Signed)
Voicemail and fax notification to Dr Martha Clan office of urine culture results

## 2015-05-21 ENCOUNTER — Ambulatory Visit
Admission: RE | Admit: 2015-05-21 | Discharge: 2015-05-21 | Disposition: A | Discharge status: Home / Self Care | Payer: Medicare Other | Source: Ambulatory Visit | Attending: Vascular Surgery | Admitting: Vascular Surgery

## 2015-05-21 ENCOUNTER — Encounter
Admission: RE | Disposition: A | Discharge status: Home / Self Care | Payer: Self-pay | Source: Ambulatory Visit | Attending: Vascular Surgery

## 2015-05-21 ENCOUNTER — Encounter: Payer: Self-pay | Admitting: *Deleted

## 2015-05-21 DIAGNOSIS — F319 Bipolar disorder, unspecified: Secondary | ICD-10-CM | POA: Insufficient documentation

## 2015-05-21 DIAGNOSIS — I10 Essential (primary) hypertension: Secondary | ICD-10-CM | POA: Diagnosis not present

## 2015-05-21 DIAGNOSIS — Z79899 Other long term (current) drug therapy: Secondary | ICD-10-CM | POA: Insufficient documentation

## 2015-05-21 DIAGNOSIS — I2510 Atherosclerotic heart disease of native coronary artery without angina pectoris: Secondary | ICD-10-CM | POA: Insufficient documentation

## 2015-05-21 DIAGNOSIS — M179 Osteoarthritis of knee, unspecified: Secondary | ICD-10-CM | POA: Insufficient documentation

## 2015-05-21 DIAGNOSIS — Z86718 Personal history of other venous thrombosis and embolism: Secondary | ICD-10-CM | POA: Insufficient documentation

## 2015-05-21 DIAGNOSIS — E119 Type 2 diabetes mellitus without complications: Secondary | ICD-10-CM | POA: Diagnosis not present

## 2015-05-21 HISTORY — DX: Atherosclerotic heart disease of native coronary artery without angina pectoris: I25.10

## 2015-05-21 HISTORY — PX: PERIPHERAL VASCULAR CATHETERIZATION: SHX172C

## 2015-05-21 LAB — GLUCOSE, CAPILLARY: Glucose-Capillary: 100 mg/dL — ABNORMAL HIGH (ref 65–99)

## 2015-05-21 SURGERY — IVC FILTER INSERTION
Anesthesia: Moderate Sedation

## 2015-05-21 MED ORDER — MIDAZOLAM HCL 2 MG/2ML IJ SOLN
INTRAMUSCULAR | Status: AC
  Filled 2015-05-21: qty 2

## 2015-05-21 MED ORDER — FENTANYL CITRATE (PF) 100 MCG/2ML IJ SOLN
INTRAMUSCULAR | Status: DC | PRN
  Administered 2015-05-21 (×2): 50 ug
  Administered 2015-05-21: 50 ug via INTRAVENOUS

## 2015-05-21 MED ORDER — LIDOCAINE HCL (PF) 1 % IJ SOLN
INTRAMUSCULAR | Status: AC
  Filled 2015-05-21: qty 10

## 2015-05-21 MED ORDER — IOHEXOL 300 MG/ML  SOLN
INTRAMUSCULAR | Status: DC | PRN
Start: 2015-05-21 — End: 2015-05-21
  Administered 2015-05-21: 15 mL via INTRAVENOUS

## 2015-05-21 MED ORDER — MIDAZOLAM HCL 2 MG/2ML IJ SOLN
INTRAMUSCULAR | Status: DC | PRN
  Administered 2015-05-21 (×2): 1 mg
  Administered 2015-05-21: 2 mg via INTRAVENOUS

## 2015-05-21 MED ORDER — CEFAZOLIN SODIUM 1-5 GM-% IV SOLN
1.0000 g | Freq: Once | INTRAVENOUS | Status: AC
  Administered 2015-05-21: 1 g via INTRAVENOUS

## 2015-05-21 MED ORDER — HEPARIN (PORCINE) IN NACL 2-0.9 UNIT/ML-% IJ SOLN
INTRAMUSCULAR | Status: AC
  Filled 2015-05-21: qty 1000

## 2015-05-21 MED ORDER — SODIUM CHLORIDE 0.9 % IV SOLN
INTRAVENOUS | Status: DC
  Administered 2015-05-21: 15:00:00 via INTRAVENOUS

## 2015-05-21 MED ORDER — FENTANYL CITRATE (PF) 100 MCG/2ML IJ SOLN
INTRAMUSCULAR | Status: AC
  Filled 2015-05-21: qty 2

## 2015-05-21 SURGICAL SUPPLY — 3 items
FILTER VC CELECT-FEMORAL (Filter) ×2 IMPLANT
PACK ANGIOGRAPHY (CUSTOM PROCEDURE TRAY) ×2 IMPLANT
WIRE J 3MM .035X145CM (WIRE) ×2 IMPLANT

## 2015-05-21 NOTE — Op Note (Signed)
[facility]   OPERATIVE NOTE    PRE-OPERATIVE DIAGNOSIS: History of DVT following orthopedic surgery; severe degenerative joint disease requiring total knee replacement  POST-OPERATIVE DIAGNOSIS: Same  PROCEDURE: 1.   Ultrasound guidance for vascular access to the right common femoral vein 2.   Catheter placement into the inferior vena cava 3.   Inferior venacavogram 4.   Placement of a Celect IVC filter  SURGEON: Schnier, Latina Craver  ASSISTANT(S): None  ANESTHESIA: local/sedation  ESTIMATED BLOOD LOSS: minimal  FINDING(S): 1.  Patent IVC  SPECIMEN(S):  none  INDICATIONS:   Amy Sweeney is a 64 y.o. y.o. female who presents with the need for total knee replacement. She has a history of DVT following orthopedic surgery and is at very high risk. Dr. Rosita Kea has asked that an IVC filter be placed prior to her knee surgery.  Inferior vena cava filter is indicated for this reason.  Risks and benefits including filter thrombosis, migration, fracture, bleeding, and infection were all discussed.  We discussed that all IVC filters that we place can be removed if desired from the patient once the need for the filter has passed.    DESCRIPTION: After obtaining full informed written consent, the patient was brought back to the vascular suite. The skin was sterilely prepped and draped in a sterile surgical field was created. The right common femoral vein was accessed under direct ultrasound guidance without difficulty with a Seldinger needle and a J-wire was then placed. The dilator is passed over the wire and the delivery sheath was placed into the inferior vena cava.  Inferior venacavogram was performed. This demonstrated a patent IVC with the level of the renal veins at the level of L1.  The filter was then deployed into the inferior vena cava at the level of L2 just below the renal veins. The delivery sheath was then removed. Pressure was held. Sterile dressings were  placed. The patient tolerated the procedure well and was taken to the recovery room in stable condition.  Interpretation: IVC is widely patent with reflux of contrast into both renal veins noted at the L1 level. Vena cava measures 22 mm in diameter. IVC filter is placed in good orientation.  COMPLICATIONS: None  CONDITION: Stable  Renford Dills, MD  05/21/2015, 3:21 PM

## 2015-05-21 NOTE — H&P (Signed)
[facility] History & Physical Update  The patient was interviewed and re-examined.  The patient's previous History and Physical has been reviewed and is unchanged.  There is no change in the plan of care. We plan to proceed with the scheduled procedure.  Schnier, Latina Craver, MD  05/21/2015, 3:20 PM

## 2015-05-22 ENCOUNTER — Encounter: Payer: Self-pay | Admitting: Vascular Surgery

## 2015-05-30 ENCOUNTER — Inpatient Hospital Stay
Admission: RE | Admit: 2015-05-30 | Discharge: 2015-06-02 | DRG: 470 | Disposition: A | Discharge status: Skilled Nursing Facility | Payer: Medicare Other | Source: Ambulatory Visit | Attending: Orthopedic Surgery | Admitting: Orthopedic Surgery

## 2015-05-30 ENCOUNTER — Encounter
Admission: RE | Disposition: A | Discharge status: Skilled Nursing Facility | Payer: Self-pay | Source: Ambulatory Visit | Attending: Orthopedic Surgery

## 2015-05-30 ENCOUNTER — Encounter: Payer: Self-pay | Admitting: *Deleted

## 2015-05-30 ENCOUNTER — Inpatient Hospital Stay: Payer: Medicare Other

## 2015-05-30 ENCOUNTER — Inpatient Hospital Stay: Payer: Medicare Other | Admitting: Anesthesiology

## 2015-05-30 DIAGNOSIS — F419 Anxiety disorder, unspecified: Secondary | ICD-10-CM | POA: Diagnosis present

## 2015-05-30 DIAGNOSIS — Z86711 Personal history of pulmonary embolism: Secondary | ICD-10-CM | POA: Diagnosis not present

## 2015-05-30 DIAGNOSIS — E871 Hypo-osmolality and hyponatremia: Secondary | ICD-10-CM | POA: Diagnosis not present

## 2015-05-30 DIAGNOSIS — Z881 Allergy status to other antibiotic agents status: Secondary | ICD-10-CM | POA: Diagnosis not present

## 2015-05-30 DIAGNOSIS — M1731 Unilateral post-traumatic osteoarthritis, right knee: Secondary | ICD-10-CM | POA: Diagnosis present

## 2015-05-30 DIAGNOSIS — I2510 Atherosclerotic heart disease of native coronary artery without angina pectoris: Secondary | ICD-10-CM | POA: Diagnosis present

## 2015-05-30 DIAGNOSIS — F319 Bipolar disorder, unspecified: Secondary | ICD-10-CM | POA: Diagnosis present

## 2015-05-30 DIAGNOSIS — Z79899 Other long term (current) drug therapy: Secondary | ICD-10-CM

## 2015-05-30 DIAGNOSIS — Z888 Allergy status to other drugs, medicaments and biological substances status: Secondary | ICD-10-CM | POA: Diagnosis not present

## 2015-05-30 DIAGNOSIS — I10 Essential (primary) hypertension: Secondary | ICD-10-CM | POA: Diagnosis present

## 2015-05-30 DIAGNOSIS — G43909 Migraine, unspecified, not intractable, without status migrainosus: Secondary | ICD-10-CM | POA: Diagnosis present

## 2015-05-30 DIAGNOSIS — Z885 Allergy status to narcotic agent status: Secondary | ICD-10-CM

## 2015-05-30 DIAGNOSIS — E119 Type 2 diabetes mellitus without complications: Secondary | ICD-10-CM | POA: Diagnosis present

## 2015-05-30 DIAGNOSIS — Z91030 Bee allergy status: Secondary | ICD-10-CM

## 2015-05-30 DIAGNOSIS — M12569 Traumatic arthropathy, unspecified knee: Secondary | ICD-10-CM | POA: Diagnosis present

## 2015-05-30 DIAGNOSIS — Z9884 Bariatric surgery status: Secondary | ICD-10-CM | POA: Diagnosis not present

## 2015-05-30 DIAGNOSIS — G8918 Other acute postprocedural pain: Secondary | ICD-10-CM

## 2015-05-30 DIAGNOSIS — Z7901 Long term (current) use of anticoagulants: Secondary | ICD-10-CM | POA: Diagnosis not present

## 2015-05-30 HISTORY — PX: TOTAL KNEE ARTHROPLASTY: SHX125

## 2015-05-30 LAB — CBC
HCT: 35.5 % (ref 35.0–47.0)
HEMOGLOBIN: 11.5 g/dL — AB (ref 12.0–16.0)
MCH: 31.4 pg (ref 26.0–34.0)
MCHC: 32.4 g/dL (ref 32.0–36.0)
MCV: 96.8 fL (ref 80.0–100.0)
Platelets: 222 10*3/uL (ref 150–440)
RBC: 3.67 MIL/uL — AB (ref 3.80–5.20)
RDW: 13.5 % (ref 11.5–14.5)
WBC: 5.8 10*3/uL (ref 3.6–11.0)

## 2015-05-30 LAB — CREATININE, SERUM: CREATININE: 0.61 mg/dL (ref 0.44–1.00)

## 2015-05-30 LAB — TYPE AND SCREEN
ABO/RH(D): A POS
Antibody Screen: NEGATIVE

## 2015-05-30 LAB — GLUCOSE, CAPILLARY
GLUCOSE-CAPILLARY: 125 mg/dL — AB (ref 65–99)
GLUCOSE-CAPILLARY: 135 mg/dL — AB (ref 65–99)
GLUCOSE-CAPILLARY: 166 mg/dL — AB (ref 65–99)

## 2015-05-30 SURGERY — ARTHROPLASTY, KNEE, TOTAL
Anesthesia: Monitor Anesthesia Care | Laterality: Right

## 2015-05-30 MED ORDER — METFORMIN HCL 500 MG PO TABS
1000.0000 mg | ORAL_TABLET | Freq: Every day | ORAL | Status: DC
  Administered 2015-05-31 – 2015-06-02 (×3): 1000 mg via ORAL
  Filled 2015-05-30 (×4): qty 2

## 2015-05-30 MED ORDER — FENTANYL CITRATE (PF) 100 MCG/2ML IJ SOLN
INTRAMUSCULAR | Status: DC | PRN
  Administered 2015-05-30: 100 ug via INTRAVENOUS

## 2015-05-30 MED ORDER — GABAPENTIN 300 MG PO CAPS
900.0000 mg | ORAL_CAPSULE | Freq: Four times a day (QID) | ORAL | Status: DC
  Administered 2015-05-30 – 2015-06-02 (×12): 900 mg via ORAL
  Filled 2015-05-30 (×12): qty 3

## 2015-05-30 MED ORDER — MIDAZOLAM HCL 5 MG/5ML IJ SOLN
INTRAMUSCULAR | Status: DC | PRN
  Administered 2015-05-30 (×2): 2 mg via INTRAVENOUS

## 2015-05-30 MED ORDER — NEOMYCIN-POLYMYXIN B GU 40-200000 IR SOLN
Status: AC
  Filled 2015-05-30: qty 20

## 2015-05-30 MED ORDER — FERROUS SULFATE 325 (65 FE) MG PO TABS
325.0000 mg | ORAL_TABLET | Freq: Every day | ORAL | Status: DC
  Administered 2015-05-31 – 2015-06-02 (×3): 325 mg via ORAL
  Filled 2015-05-30 (×3): qty 1

## 2015-05-30 MED ORDER — SODIUM CHLORIDE 0.9 % IJ SOLN
INTRAMUSCULAR | Status: AC
  Filled 2015-05-30: qty 100

## 2015-05-30 MED ORDER — MENTHOL 3 MG MT LOZG: 1.0000 | LOZENGE | OROMUCOSAL | Status: DC | PRN

## 2015-05-30 MED ORDER — VITAMIN D3 125 MCG (5000 UT) PO CAPS: 5.0000 | ORAL_CAPSULE | Freq: Every morning | ORAL | Status: DC

## 2015-05-30 MED ORDER — LIDOCAINE HCL 2 % EX GEL
CUTANEOUS | Status: DC | PRN
  Administered 2015-05-30: 1 via TOPICAL

## 2015-05-30 MED ORDER — DIPHENHYDRAMINE HCL 25 MG PO CAPS: 25.0000 mg | ORAL_CAPSULE | Freq: Every evening | ORAL | Status: DC | PRN

## 2015-05-30 MED ORDER — PROPOFOL INFUSION 10 MG/ML OPTIME
INTRAVENOUS | Status: DC | PRN
  Administered 2015-05-30: 80 ug/kg/min via INTRAVENOUS

## 2015-05-30 MED ORDER — CALCIUM CITRATE-VITAMIN D 500-400 MG-UNIT PO CHEW
1.0000 | CHEWABLE_TABLET | Freq: Two times a day (BID) | ORAL | Status: DC
Start: 2015-05-30 — End: 2015-05-30
  Filled 2015-05-30 (×2): qty 1

## 2015-05-30 MED ORDER — BUPIVACAINE-EPINEPHRINE (PF) 0.25% -1:200000 IJ SOLN
INTRAMUSCULAR | Status: AC
  Filled 2015-05-30: qty 30

## 2015-05-30 MED ORDER — FENTANYL CITRATE (PF) 100 MCG/2ML IJ SOLN: 25.0000 ug | INTRAMUSCULAR | Status: DC | PRN

## 2015-05-30 MED ORDER — BISACODYL 10 MG RE SUPP: 10.0000 mg | Freq: Every day | RECTAL | Status: DC | PRN

## 2015-05-30 MED ORDER — FLUTICASONE PROPIONATE 50 MCG/ACT NA SUSP
2.0000 | NASAL | Status: DC
  Administered 2015-05-31 – 2015-06-02 (×3): 2 via NASAL
  Filled 2015-05-30: qty 16

## 2015-05-30 MED ORDER — ZINC SULFATE 220 (50 ZN) MG PO CAPS
220.0000 mg | ORAL_CAPSULE | Freq: Every morning | ORAL | Status: DC
  Administered 2015-05-31 – 2015-06-02 (×3): 220 mg via ORAL
  Filled 2015-05-30 (×4): qty 1

## 2015-05-30 MED ORDER — CALCIUM CITRATE 950 (200 CA) MG PO TABS
475.0000 mg | ORAL_TABLET | Freq: Two times a day (BID) | ORAL | Status: DC
  Filled 2015-05-30 (×2): qty 1

## 2015-05-30 MED ORDER — FAMOTIDINE 20 MG PO TABS
ORAL_TABLET | ORAL | Status: AC
  Administered 2015-05-30: 20 mg via ORAL
  Filled 2015-05-30: qty 1

## 2015-05-30 MED ORDER — ONDANSETRON HCL 4 MG PO TABS: 4.0000 mg | ORAL_TABLET | Freq: Four times a day (QID) | ORAL | Status: DC | PRN

## 2015-05-30 MED ORDER — ENOXAPARIN SODIUM 30 MG/0.3ML ~~LOC~~ SOLN
30.0000 mg | Freq: Two times a day (BID) | SUBCUTANEOUS | Status: DC
  Administered 2015-05-31 – 2015-06-02 (×4): 30 mg via SUBCUTANEOUS
  Filled 2015-05-30 (×6): qty 0.3

## 2015-05-30 MED ORDER — FAMOTIDINE 20 MG PO TABS
20.0000 mg | ORAL_TABLET | Freq: Once | ORAL | Status: AC
  Administered 2015-05-30: 20 mg via ORAL

## 2015-05-30 MED ORDER — METOCLOPRAMIDE HCL 5 MG/ML IJ SOLN: 5.0000 mg | Freq: Three times a day (TID) | INTRAMUSCULAR | Status: DC | PRN

## 2015-05-30 MED ORDER — DIPHENHYDRAMINE HCL 25 MG PO CAPS
50.0000 mg | ORAL_CAPSULE | Freq: Every day | ORAL | Status: DC
  Administered 2015-05-30 – 2015-06-01 (×3): 50 mg via ORAL
  Filled 2015-05-30 (×2): qty 2

## 2015-05-30 MED ORDER — MAGNESIUM CITRATE PO SOLN: 1.0000 | Freq: Once | ORAL | Status: DC | PRN

## 2015-05-30 MED ORDER — OXYCODONE HCL ER 20 MG PO T12A
20.0000 mg | EXTENDED_RELEASE_TABLET | Freq: Two times a day (BID) | ORAL | Status: DC
  Administered 2015-05-30 – 2015-06-02 (×6): 20 mg via ORAL
  Filled 2015-05-30 (×6): qty 1

## 2015-05-30 MED ORDER — METOCLOPRAMIDE HCL 5 MG PO TABS: 5.0000 mg | ORAL_TABLET | Freq: Three times a day (TID) | ORAL | Status: DC | PRN

## 2015-05-30 MED ORDER — ACETAMINOPHEN 650 MG RE SUPP: 650.0000 mg | Freq: Four times a day (QID) | RECTAL | Status: DC | PRN

## 2015-05-30 MED ORDER — CEFAZOLIN SODIUM-DEXTROSE 2-3 GM-% IV SOLR
INTRAVENOUS | Status: AC
  Filled 2015-05-30: qty 50

## 2015-05-30 MED ORDER — TRANEXAMIC ACID 1000 MG/10ML IV SOLN
1000.0000 mg | INTRAVENOUS | Status: DC | PRN
  Administered 2015-05-30: 1000 mg via TOPICAL

## 2015-05-30 MED ORDER — MAGNESIUM OXIDE 400 (241.3 MG) MG PO TABS
400.0000 mg | ORAL_TABLET | ORAL | Status: DC
  Administered 2015-05-31 – 2015-06-02 (×3): 400 mg via ORAL
  Filled 2015-05-30 (×3): qty 1

## 2015-05-30 MED ORDER — VITAMIN E 45 MG (100 UNIT) PO CAPS
1000.0000 [IU] | ORAL_CAPSULE | Freq: Every day | ORAL | Status: DC
  Administered 2015-05-31 – 2015-06-02 (×3): 1000 [IU] via ORAL
  Filled 2015-05-30 (×3): qty 2

## 2015-05-30 MED ORDER — FENTANYL 25 MCG/HR TD PT72
50.0000 ug | MEDICATED_PATCH | TRANSDERMAL | Status: DC
  Administered 2015-05-30: 50 ug via TRANSDERMAL
  Filled 2015-05-30 (×2): qty 2

## 2015-05-30 MED ORDER — DEXTROSE 5 % IV SOLN: 500.0000 mg | Freq: Four times a day (QID) | INTRAVENOUS | Status: DC | PRN

## 2015-05-30 MED ORDER — ONDANSETRON HCL 4 MG/2ML IJ SOLN: 4.0000 mg | Freq: Four times a day (QID) | INTRAMUSCULAR | Status: DC | PRN

## 2015-05-30 MED ORDER — HYDROMORPHONE HCL 1 MG/ML IJ SOLN
1.0000 mg | INTRAMUSCULAR | Status: DC | PRN
  Administered 2015-05-30 – 2015-05-31 (×6): 1 mg via INTRAVENOUS
  Filled 2015-05-30 (×6): qty 1

## 2015-05-30 MED ORDER — CEFAZOLIN SODIUM-DEXTROSE 2-3 GM-% IV SOLR
2.0000 g | Freq: Once | INTRAVENOUS | Status: AC
  Administered 2015-05-30: 2 g via INTRAVENOUS

## 2015-05-30 MED ORDER — SODIUM CHLORIDE 0.9 % IV SOLN: INTRAVENOUS | Status: DC

## 2015-05-30 MED ORDER — NEOMYCIN-POLYMYXIN B GU 40-200000 IR SOLN
Status: DC | PRN
  Administered 2015-05-30: 16 mL

## 2015-05-30 MED ORDER — SODIUM CHLORIDE 0.9 % IV SOLN
INTRAVENOUS | Status: DC
  Administered 2015-05-31: 05:00:00 via INTRAVENOUS

## 2015-05-30 MED ORDER — CLONAZEPAM 1 MG PO TABS
1.0000 mg | ORAL_TABLET | Freq: Two times a day (BID) | ORAL | Status: DC | PRN
  Administered 2015-05-30 – 2015-06-01 (×4): 1 mg via ORAL
  Filled 2015-05-30 (×4): qty 1

## 2015-05-30 MED ORDER — DIPHENHYDRAMINE HCL (SLEEP) 25 MG PO TABS
50.0000 mg | ORAL_TABLET | Freq: Every day | ORAL | Status: DC
  Filled 2015-05-30 (×2): qty 2

## 2015-05-30 MED ORDER — BUPIVACAINE LIPOSOME 1.3 % IJ SUSP: INTRAMUSCULAR | Status: DC | PRN

## 2015-05-30 MED ORDER — DEXAMETHASONE SODIUM PHOSPHATE 10 MG/ML IJ SOLN
INTRAMUSCULAR | Status: DC | PRN
  Administered 2015-05-30: 5 mg via INTRAVENOUS

## 2015-05-30 MED ORDER — PHENOL 1.4 % MT LIQD: 1.0000 | OROMUCOSAL | Status: DC | PRN

## 2015-05-30 MED ORDER — ACETAMINOPHEN 325 MG PO TABS: 650.0000 mg | ORAL_TABLET | Freq: Four times a day (QID) | ORAL | Status: DC | PRN

## 2015-05-30 MED ORDER — METHOCARBAMOL 500 MG PO TABS
500.0000 mg | ORAL_TABLET | Freq: Four times a day (QID) | ORAL | Status: DC | PRN
  Administered 2015-05-31: 500 mg via ORAL
  Filled 2015-05-30: qty 1

## 2015-05-30 MED ORDER — TRANEXAMIC ACID 1000 MG/10ML IV SOLN
INTRAVENOUS | Status: AC
  Filled 2015-05-30: qty 10

## 2015-05-30 MED ORDER — ZOLPIDEM TARTRATE 5 MG PO TABS: 5.0000 mg | ORAL_TABLET | Freq: Every evening | ORAL | Status: DC | PRN

## 2015-05-30 MED ORDER — BACLOFEN 10 MG PO TABS
5.0000 mg | ORAL_TABLET | Freq: Two times a day (BID) | ORAL | Status: DC | PRN
  Administered 2015-05-30 – 2015-06-02 (×4): 5 mg via ORAL
  Filled 2015-05-30 (×4): qty 1

## 2015-05-30 MED ORDER — MAGNESIUM HYDROXIDE 400 MG/5ML PO SUSP
30.0000 mL | Freq: Every day | ORAL | Status: DC | PRN
  Filled 2015-05-30: qty 30

## 2015-05-30 MED ORDER — BUPIVACAINE LIPOSOME 1.3 % IJ SUSP
INTRAMUSCULAR | Status: AC
  Filled 2015-05-30: qty 20

## 2015-05-30 MED ORDER — LACTATED RINGERS IV SOLN
INTRAVENOUS | Status: DC | PRN
  Administered 2015-05-30: 10:00:00 via INTRAVENOUS

## 2015-05-30 MED ORDER — VITAMIN B-12 1000 MCG PO TABS
5000.0000 ug | ORAL_TABLET | ORAL | Status: DC
  Administered 2015-05-31 – 2015-06-02 (×3): 5000 ug via ORAL
  Filled 2015-05-30 (×4): qty 5

## 2015-05-30 MED ORDER — BUPIVACAINE-EPINEPHRINE 0.25% -1:200000 IJ SOLN
INTRAMUSCULAR | Status: DC | PRN
  Administered 2015-05-30: 30 mL

## 2015-05-30 MED ORDER — CEFAZOLIN SODIUM-DEXTROSE 2-3 GM-% IV SOLR
2.0000 g | Freq: Four times a day (QID) | INTRAVENOUS | Status: AC
  Administered 2015-05-30 – 2015-05-31 (×3): 2 g via INTRAVENOUS
  Filled 2015-05-30 (×3): qty 50

## 2015-05-30 MED ORDER — ONDANSETRON HCL 4 MG/2ML IJ SOLN: 4.0000 mg | Freq: Once | INTRAMUSCULAR | Status: DC | PRN

## 2015-05-30 MED ORDER — BUPIVACAINE LIPOSOME 1.3 % IJ SUSP
INTRAMUSCULAR | Status: DC | PRN
  Administered 2015-05-30: 60 mL

## 2015-05-30 MED ORDER — DIVALPROEX SODIUM 500 MG PO DR TAB
1500.0000 mg | DELAYED_RELEASE_TABLET | ORAL | Status: DC
  Administered 2015-05-31 – 2015-06-02 (×3): 1500 mg via ORAL
  Filled 2015-05-30 (×4): qty 3

## 2015-05-30 MED ORDER — CALCIUM CITRATE-VITAMIN D 315-250 MG-UNIT PO TABS
1.0000 | ORAL_TABLET | Freq: Two times a day (BID) | ORAL | Status: DC
  Administered 2015-05-30 – 2015-06-02 (×6): 1 via ORAL
  Filled 2015-05-30 (×8): qty 1

## 2015-05-30 MED ORDER — OXYCODONE HCL 5 MG PO TABS
5.0000 mg | ORAL_TABLET | ORAL | Status: DC | PRN
  Administered 2015-05-30 (×2): 5 mg via ORAL
  Administered 2015-05-31 – 2015-06-01 (×5): 10 mg via ORAL
  Administered 2015-06-01 – 2015-06-02 (×2): 5 mg via ORAL
  Filled 2015-05-30: qty 1
  Filled 2015-05-30: qty 2
  Filled 2015-05-30: qty 1
  Filled 2015-05-30 (×2): qty 2
  Filled 2015-05-30 (×2): qty 1
  Filled 2015-05-30 (×2): qty 2

## 2015-05-30 MED ORDER — ONDANSETRON HCL 4 MG/2ML IJ SOLN
INTRAMUSCULAR | Status: DC | PRN
  Administered 2015-05-30: 4 mg via INTRAVENOUS

## 2015-05-30 MED ORDER — TRAZODONE HCL 100 MG PO TABS
100.0000 mg | ORAL_TABLET | Freq: Every day | ORAL | Status: DC
  Administered 2015-05-30 – 2015-06-01 (×3): 100 mg via ORAL
  Filled 2015-05-30 (×3): qty 1

## 2015-05-30 MED ORDER — DOCUSATE SODIUM 100 MG PO CAPS
100.0000 mg | ORAL_CAPSULE | Freq: Two times a day (BID) | ORAL | Status: DC
  Administered 2015-05-30 – 2015-06-02 (×6): 100 mg via ORAL
  Filled 2015-05-30 (×6): qty 1

## 2015-05-30 MED ORDER — DULOXETINE HCL 30 MG PO CPEP
60.0000 mg | ORAL_CAPSULE | Freq: Every day | ORAL | Status: DC
  Administered 2015-05-31 – 2015-06-02 (×3): 60 mg via ORAL
  Filled 2015-05-30 (×4): qty 2

## 2015-05-30 SURGICAL SUPPLY — 57 items
BANDAGE ELASTIC 6 CLIP ST LF (GAUZE/BANDAGES/DRESSINGS) ×2 IMPLANT
BLADE SAW 1 (BLADE) ×2 IMPLANT
CANISTER SUCT 1200ML W/VALVE (MISCELLANEOUS) ×2 IMPLANT
CANISTER SUCT 3000ML (MISCELLANEOUS) ×4 IMPLANT
CAPT KNEE TOTAL 3 ×2 IMPLANT
CATH FOL LEG HOLDER (MISCELLANEOUS) ×2 IMPLANT
CATH TRAY METER 16FR LF (MISCELLANEOUS) ×2 IMPLANT
CEMENT HV SMART SET (Cement) ×4 IMPLANT
CHLORAPREP 10.5 ML. IMPLANT
CHLORAPREP W/TINT 26ML (MISCELLANEOUS) ×4 IMPLANT
COOLER POLAR GLACIER W/PUMP (MISCELLANEOUS) ×2 IMPLANT
DECANTER SPIKE VIAL GLASS SM (MISCELLANEOUS) ×2 IMPLANT
DRAPE INCISE IOBAN 66X45 STRL (DRAPES) ×4 IMPLANT
DRAPE SHEET LG 3/4 BI-LAMINATE (DRAPES) ×4 IMPLANT
ELECT CAUTERY BLADE 6.4 (BLADE) ×2 IMPLANT
FEMUR CUTTING BLOCK IMPLANT
GAUZE PETRO XEROFOAM 1X8 (MISCELLANEOUS) ×2 IMPLANT
GAUZE SPONGE 4X4 12PLY STRL (GAUZE/BANDAGES/DRESSINGS) ×2 IMPLANT
GLOVE BIOGEL PI IND STRL 9 (GLOVE) ×1 IMPLANT
GLOVE BIOGEL PI INDICATOR 9 (GLOVE) ×1
GLOVE SURG ORTHO 9.0 STRL STRW (GLOVE) ×2 IMPLANT
GOWN SPECIALTY ULTRA XL (MISCELLANEOUS) ×2 IMPLANT
GOWN STRL REUS W/ TWL LRG LVL3 (GOWN DISPOSABLE) ×2 IMPLANT
GOWN STRL REUS W/TWL LRG LVL3 (GOWN DISPOSABLE) ×2
HANDPIECE SUCTION TUBG SURGILV (MISCELLANEOUS) ×2 IMPLANT
HOOD PEEL AWAY FACE SHEILD DIS (HOOD) ×4 IMPLANT
IMMBOLIZER KNEE 19 BLUE UNIV (SOFTGOODS) IMPLANT
IV SET EXTENSION 6 LL TADAPT (SET/KITS/TRAYS/PACK) IMPLANT
KNEE MEDACTA TIBIAL/FEMORAL BL (Knees) ×2 IMPLANT
KNIFE SCULPS 14X20 (INSTRUMENTS) ×2 IMPLANT
NDL SAFETY 18GX1.5 (NEEDLE) ×2 IMPLANT
NDL SAFETY ECLIPSE 18X1.5 (NEEDLE) ×1 IMPLANT
NEEDLE HYPO 18GX1.5 SHARP (NEEDLE) ×1
NEEDLE SPNL 18GX3.5 QUINCKE PK (NEEDLE) ×2 IMPLANT
NEEDLE SPNL 20GX3.5 QUINCKE YW (NEEDLE) ×2 IMPLANT
NS IRRIG 1000ML POUR BTL (IV SOLUTION) ×2 IMPLANT
PACK TOTAL KNEE (MISCELLANEOUS) ×2 IMPLANT
PAD GROUND ADULT SPLIT (MISCELLANEOUS) ×2 IMPLANT
PAD WRAPON POLAR KNEE (MISCELLANEOUS) ×1 IMPLANT
PREP CHG 10.5 TEAL (MISCELLANEOUS) ×2 IMPLANT
SOL .9 NS 3000ML IRR  AL (IV SOLUTION) ×1
SOL .9 NS 3000ML IRR UROMATIC (IV SOLUTION) ×1 IMPLANT
STAPLER SKIN PROX 35W (STAPLE) ×2 IMPLANT
STEM EXTENSION 11MMX30MM (Stem) ×2 IMPLANT
STRAP SAFETY BODY (MISCELLANEOUS) ×2 IMPLANT
SUCTION FRAZIER TIP 10 FR DISP (SUCTIONS) ×2 IMPLANT
SUT DVC 2 QUILL PDO  T11 36X36 (SUTURE) ×1
SUT DVC 2 QUILL PDO T11 36X36 (SUTURE) ×1 IMPLANT
SUT DVC QUILL MONODERM 30X30 (SUTURE) ×2 IMPLANT
SUT ETHIBOND NAB CT1 #1 30IN (SUTURE) ×2 IMPLANT
SYR 20CC LL (SYRINGE) ×2 IMPLANT
SYR 50ML LL SCALE MARK (SYRINGE) ×2 IMPLANT
SYRINGE 10CC LL (SYRINGE) ×2 IMPLANT
TIBIAL CUTTING BLOCK IMPLANT
TOWER CARTRIDGE SMART MIX (DISPOSABLE) ×2 IMPLANT
WATER STERILE IRR 1000ML POUR (IV SOLUTION) IMPLANT
WRAPON POLAR PAD KNEE (MISCELLANEOUS) ×2

## 2015-05-30 NOTE — Op Note (Signed)
05/30/2015  1:25 PM  PATIENT:  Amy Sweeney  64 y.o. female  PRE-OPERATIVE DIAGNOSIS:  post-traumatic osteoarthritis right knee  POST-OPERATIVE DIAGNOSIS:  post-traumatic osteoarthritis right knee  PROCEDURE:  Procedure(s): TOTAL KNEE ARTHROPLASTY (Right)  SURGEON: Leitha Schuller, MD  ASSISTANTS: [place] [facility]  ANESTHESIA:   spinal  EBL:  Total I/O In: 900 [I.V.:900] Out: 350 [Urine:250; Blood:100]  BLOOD ADMINISTERED:none  DRAINS: none   LOCAL MEDICATIONS USED:  MARCAINE    and OTHER Exparel  SPECIMEN:  Source of Specimen:  Cut ends of bone  DISPOSITION OF SPECIMEN:  PATHOLOGY  COUNTS:  YES  TOURNIQUET:  94 at 300 mmHg IMPLANTS: Medacta GMK sphere 4+ right femur, 5 tibia with 10 mm insert and 2 patella, all components cemented  DICTATION: .Dragon Dictation patient brought the operating room and after adequate anesthesia was obtained the right leg is prepped draped in sterile fashion was turned by the upper thigh after patient education timeout procedure completed, tourniquet was raised to 3 mmHg. A midline skin incision was made followed by medial parapatellar arthrotomy inspection knee revealed quite a bit of synovitis present within the knee and subsequently a portion of this was sent as a separate specimen as it had a brown appearance could be consistent with pigmented villonodular synovitis. There was exposed bone on the tibial and femoral component compartments medially as well as extensive wear of the remainder of the knee there is adhesions throughout the knee as well for presumed from prior fracture. These adhesions were released to allow for mobilization of patella with the knee in flexion and fat pad was excised along with anterior cruciate ligament and PCL proximal tibia was then exposed for application Medacta cutting guide and a proximal tibia cut carried out. The identical procedure on the distal femur with a cutting block applied anterior posterior  chamfer cuts are made with no notching the bone was quite soft on both tibial plateau and femur except for the medial portion of the medial tibia were which had been previously compressed with a plateau fracture. The tibial baseplate was placed after excision of the menisci and proximal preparation for short stem performed followed by the keel punch left in place before trial femur was placed and a 10 mm insert gave full extension and good stability distal femoral drill holes were made and the notch cut then made patella was cut with a freehand technique as there is extensive scar tissue around the patella sized to size 2 after drills were made and again had significant osteopenia. The bony surfaces were thoroughly irrigated and dried after injecting Exparel and Sensorcaine. Articular for postop analgesia the components were then cemented in place first the tibia followed by the tibial baseplate insert and set screw the distal femoral component and then the patella after the cement set excess cement was removed and a lateral release performed because of poor patellofemoral tracking the arthrotomy was then repaired using a heavy [name] along the arthrotomy and #1 Ethibond at the medial retinaculum as reinforcement followed by 2-0 Quill subcutaneously and skin staples. Xeroform 4 x 4's ABDs and web roll and Ace wrap were applied with Polar Care  PLAN OF CARE: Admit to inpatient   PATIENT DISPOSITION:  PACU - hemodynamically stable.

## 2015-05-30 NOTE — Anesthesia Procedure Notes (Addendum)
Performed by: Kennon Holter Pre-anesthesia Checklist: Patient identified, Emergency Drugs available, Suction available, Patient being monitored and Timeout performed Patient Re-evaluated:Patient Re-evaluated prior to inductionOxygen Delivery Method: Simple face mask Preoxygenation: Pre-oxygenation with 100% oxygen Intubation Type: IV induction   Spinal Patient location during procedure: OR Start time: 05/30/2015 10:47 AM End time: 05/30/2015 10:57 AM Staffing Anesthesiologist: Martha Clan Performed by: anesthesiologist  Preanesthetic Checklist Completed: patient identified, site marked, surgical consent, pre-op evaluation, timeout performed, IV checked, risks and benefits discussed and monitors and equipment checked Spinal Block Patient position: sitting Prep: ChloraPrep Patient monitoring: heart rate, continuous pulse ox, blood pressure and cardiac monitor Approach: midline Location: L3-4 Injection technique: single-shot Needle Needle type: Whitacre and Introducer  Needle gauge: 25 G Needle length: 15 cm Additional Notes Negative paresthesia. Negative blood return. Positive free-flowing CSF. Expiration date of kit checked and confirmed. Patient tolerated procedure well, without complications.

## 2015-05-30 NOTE — H&P (Signed)
Reviewed paper H+P, will be scanned into chart. No changes noted.  

## 2015-05-30 NOTE — Progress Notes (Signed)
PT Cancellation Note  Patient Details Name: MONIE SHERE MRN: [id number] DOB: 12-18-1950   Cancelled Treatment:    Reason Eval/Treat Not Completed: Medical issues which prohibited therapy. Attempted to evaluate patient. Pt still has no motor function or sensation of RLE. Not appropriate for evaluation currently. Will attempt evaluation on next date.  Sharalyn Ink Huprich PT, DPT   Huprich,Jason 05/30/2015, 3:35 PM

## 2015-05-30 NOTE — Anesthesia Preprocedure Evaluation (Signed)
Anesthesia Evaluation  Patient identified by MRN, date of birth, ID band Patient awake    Reviewed: Allergy & Precautions, H&P , NPO status , Patient's Chart, lab work & pertinent test results, reviewed documented beta blocker date and time   History of Anesthesia Complications Negative for: history of anesthetic complications  Airway Mallampati: III  TM Distance: >3 FB Neck ROM: full    Dental no notable dental hx. (+) Teeth Intact   Pulmonary neg pulmonary ROS,    Pulmonary exam normal breath sounds clear to auscultation       Cardiovascular Exercise Tolerance: Good (-) hypertension(-) angina+ CAD  (-) Past MI, (-) Cardiac Stents and (-) CABG Normal cardiovascular exam(-) dysrhythmias (-) Valvular Problems/Murmurs Rhythm:regular Rate:Normal     Neuro/Psych  Headaches (Migraines), neg Seizures PSYCHIATRIC DISORDERS (Bipolar and anxiety)    GI/Hepatic negative GI ROS, Neg liver ROS,   Endo/Other  diabetes, Oral Hypoglycemic AgentsMorbid obesity  Renal/GU negative Renal ROS  negative genitourinary   Musculoskeletal   Abdominal   Peds  Hematology negative hematology ROS (+)   Anesthesia Other Findings Past Medical History:   Family history of adverse reaction to anesthes*                Comment:father blood clot after surgery died   Bipolar disorder                                             Diabetes mellitus without complication                       Anxiety                                                      Headache                                                       Comment:history of migraines   Coronary artery disease                                      Arthritis                                                      Comment:degenerative artgritis  osteoarthritis   Reproductive/Obstetrics negative OB ROS                             Anesthesia Physical Anesthesia Plan  ASA:  III  Anesthesia Plan: Spinal and MAC   Post-op Pain Management:    Induction:   Airway Management Planned:   Additional Equipment:   Intra-op Plan:   Post-operative Plan:   Informed Consent: I have reviewed the patients History and Physical, chart, labs and discussed the procedure including  the risks, benefits and alternatives for the proposed anesthesia with the patient or authorized representative who has indicated his/her understanding and acceptance.   Dental Advisory Given  Plan Discussed with: Anesthesiologist, CRNA and Surgeon  Anesthesia Plan Comments:         Anesthesia Quick Evaluation

## 2015-05-30 NOTE — Transfer of Care (Signed)
Immediate Anesthesia Transfer of Care Note  Patient: Amy Sweeney  Procedure(s) Performed: Procedure(s): TOTAL KNEE ARTHROPLASTY (Right)  Patient Location: PACU  Anesthesia Type:Regional  Level of Consciousness: awake, alert  and oriented  Airway & Oxygen Therapy: Patient Spontanous Breathing and Patient connected to face mask oxygen  Post-op Assessment: Report given to RN and Post -op Vital signs reviewed and stable  Post vital signs: Reviewed and stable  Last Vitals:  Filed Vitals:   05/30/15 1313  BP:   Pulse:   Temp: 37.4 C  Resp:     Complications: No apparent anesthesia complications

## 2015-05-31 DIAGNOSIS — Z7901 Long term (current) use of anticoagulants: Secondary | ICD-10-CM

## 2015-05-31 DIAGNOSIS — E119 Type 2 diabetes mellitus without complications: Secondary | ICD-10-CM

## 2015-05-31 DIAGNOSIS — Z79899 Other long term (current) drug therapy: Secondary | ICD-10-CM

## 2015-05-31 DIAGNOSIS — Z86711 Personal history of pulmonary embolism: Secondary | ICD-10-CM

## 2015-05-31 LAB — BASIC METABOLIC PANEL
ANION GAP: 9 (ref 5–15)
BUN: 10 mg/dL (ref 6–20)
CALCIUM: 8.6 mg/dL — AB (ref 8.9–10.3)
CO2: 27 mmol/L (ref 22–32)
Chloride: 95 mmol/L — ABNORMAL LOW (ref 101–111)
Creatinine, Ser: 0.63 mg/dL (ref 0.44–1.00)
Glucose, Bld: 217 mg/dL — ABNORMAL HIGH (ref 65–99)
Potassium: 4.6 mmol/L (ref 3.5–5.1)
Sodium: 131 mmol/L — ABNORMAL LOW (ref 135–145)

## 2015-05-31 LAB — GLUCOSE, CAPILLARY
GLUCOSE-CAPILLARY: 217 mg/dL — AB (ref 65–99)
GLUCOSE-CAPILLARY: 212 mg/dL — AB (ref 65–99)
GLUCOSE-CAPILLARY: 230 mg/dL — AB (ref 65–99)
Glucose-Capillary: 195 mg/dL — ABNORMAL HIGH (ref 65–99)

## 2015-05-31 LAB — CBC
HEMATOCRIT: 35.1 % (ref 35.0–47.0)
Hemoglobin: 11.8 g/dL — ABNORMAL LOW (ref 12.0–16.0)
MCH: 32.1 pg (ref 26.0–34.0)
MCHC: 33.6 g/dL (ref 32.0–36.0)
MCV: 95.5 fL (ref 80.0–100.0)
PLATELETS: 230 10*3/uL (ref 150–440)
RBC: 3.68 MIL/uL — AB (ref 3.80–5.20)
RDW: 13.3 % (ref 11.5–14.5)
WBC: 8.4 10*3/uL (ref 3.6–11.0)

## 2015-05-31 NOTE — Clinical Social Work Note (Signed)
CSW was notified by [facility] that pt was in agreement with SNF DC.  FL2 sent to facilities in [place] for bed request.  Full assessment to follow.

## 2015-05-31 NOTE — Progress Notes (Signed)
   Subjective: 1 Day Post-Op Procedure(s) (LRB): TOTAL KNEE ARTHROPLASTY (Right) Patient reports pain as 10 on 0-10 scale.   Patient is having a hard time staying awake. Complains of severe pain in right knee. We will start therapy today.    Objective: Vital signs in last 24 hours: Temp:  [97.7 F (36.5 C)-99.4 F (37.4 C)] 98.4 F (36.9 C) (09/16 0434) Pulse Rate:  [32-99] 97 (09/16 0434) Resp:  [16-19] 18 (09/16 0434) BP: (111-181)/(60-99) 181/92 mmHg (09/16 0434) SpO2:  [91 %-98 %] 92 % (09/16 0434) Weight:  [114.76 kg (253 lb)] 114.76 kg (253 lb) (09/15 0906)  Intake/Output from previous day: 09/15 0701 - 09/16 0700 In: 900 [I.V.:900] Out: 2675 [Urine:2575; Blood:100] Intake/Output this shift:     Recent Labs  05/30/15 1542 05/31/15 0600  HGB 11.5* 11.8*    Recent Labs  05/30/15 1542 05/31/15 0600  WBC 5.8 8.4  RBC 3.67* 3.68*  HCT 35.5 35.1  PLT 222 230    Recent Labs  05/30/15 1542 05/31/15 0600  NA  --  131*  K  --  4.6  CL  --  95*  CO2  --  27  BUN  --  10  CREATININE 0.61 0.63  GLUCOSE  --  217*  CALCIUM  --  8.6*   No results for input(s): LABPT, INR in the last 72 hours.  EXAM General - Patient is Alert, Appropriate and Oriented Extremity - Neurovascular intact Sensation intact distally Intact pulses distally Dorsiflexion/Plantar flexion intact Dressing - dressing C/D/I Motor Function - intact, moving foot and toes well on exam.   Past Medical History  Diagnosis Date  . Family history of adverse reaction to anesthesia     father blood clot after surgery died  . Bipolar disorder   . Diabetes mellitus without complication   . Anxiety   . Headache     history of migraines  . Coronary artery disease   . Arthritis     degenerative artgritis  osteoarthritis    Assessment/Plan:   1 Day Post-Op Procedure(s) (LRB): TOTAL KNEE ARTHROPLASTY (Right) Active Problems:   Traumatic arthritis of knee   Hyponatremia  Estimated body  mass index is 38.48 kg/(m^2) as calculated from the following:   Height as of this encounter:  (1.727 m).   Weight as of this encounter: 114.76 kg (253 lb). Advance diet Up with therapy  Needs BM Recheck labs in the am Continue with IV NS Continue with current pain regimen  DVT Prophylaxis - Lovenox, Foot Pumps and TED hose Weight-Bearing as tolerated to right leg D/C O2 and Pulse OX and try on Room Air  T. Cranston Neighbor, PA-C [facility] 05/31/2015, 7:35 AM

## 2015-05-31 NOTE — Progress Notes (Signed)
Inpatient Diabetes Program Recommendations  AACE/ADA: New Consensus Statement on Inpatient Glycemic Control (2015)  Target Ranges:  Prepandial:   less than 140 mg/dL      Peak postprandial:   less than 180 mg/dL (1-2 hours)      Critically ill patients:  140 - 180 mg/dL   Results for EVANGELINE, UTLEY (MRN [id number]) as of 05/31/2015 10:14  Ref. Range 05/30/2015 08:50 05/30/2015 14:16 05/30/2015 21:04 05/31/2015 08:25  Glucose-Capillary Latest Ref Range: 65-99 mg/dL 409 (H) 811 (H) 914 (H) 217 (H)   Review of Glycemic Control  Diabetes history: DM2 Outpatient Diabetes medications: Metformin 1000 mg QAM Current orders for Inpatient glycemic control: Metformin 1000 mg QAM  Inpatient Diabetes Program Recommendations: Correction (SSI): While inpatient please consider ordering CBGs with Novolog correction scale ACHS. HgbA1C: Please order an A1C to evaluate glycemic control over the past 2-3 months.  Thanks, Orlando Penner, RN, MSN, CCRN, CDE Diabetes Coordinator Inpatient Diabetes Program [phone] (Team Pager from 8am to 5pm) [phone] ([facility] office) [phone] [facility] office) [phone] [facility] office)

## 2015-05-31 NOTE — Evaluation (Signed)
Occupational Therapy Evaluation Patient Details Name: TAPANGA OTTAWAY MRN: [id number] DOB: 10-Oct-1950 Today's Date: 05/31/2015    History of Present Illness This patient is a 64 year old female who underwent R TKR and is POD#1 at time of evaluation.    Clinical Impression   This patient is a 64 year old female who came to [facility] for a R total knee replacement.  Patient lives in a home with her husband.  She had been independent with ADL and functional mobility. She now requires assistance and now requires assist and would benefit from Occupational Therapy for ADL/functioal mobility training.      Follow Up Recommendations  SNF    Equipment Recommendations       Recommendations for Other Services       Precautions / Restrictions Precautions Precautions: Fall Required Braces or Orthoses: Knee Immobilizer - Right Knee Immobilizer - Right: Discontinue once straight leg raise with < 10 degree lag Restrictions Weight Bearing Restrictions: Yes RLE Weight Bearing: Weight bearing as tolerated      Mobility Bed Mobility  Transfers    Balance                            ADL                                         General ADL Comments: Had been independent . During this evaluation patient very sleepy and was difficult to stay awake. Practiced lower body dressing using hip kit, but needed hand over hand assist secondary to lethargy.     Vision     Perception     Praxis      Pertinent Vitals/Pain Pain Assessment: 0-10 Pain Score: 10-Worst pain ever Pain Location: eyes (chronic); 10/10 R knee pain Pain Intervention(s): Limited activity within patient's tolerance meds given during session.     Hand Dominance     Extremity/Trunk Assessment Upper Extremity Assessment Upper Extremity Assessment:  (B UE 4/5)   Lower Extremity Assessment Lower Extremity Assessment: Defer to PT evaluation RLE Deficits / Details:  Unable to perform SLR or SAQ on RLE without assistance. Pt yells out in pain with movement of RLE. LLE appears grossly WFL with transfers and ambulation RLE: Unable to fully assess due to pain       Communication Communication Communication: Other (comment) (Confused and lethargic)   Cognition Arousal/Alertness: Lethargic Behavior During Therapy: Anxious Overall Cognitive Status: No family/caregiver present to determine baseline cognitive functioning                     General Comments       Exercises     Shoulder Instructions      Home Living Family/patient expects to be discharged to:: Skilled nursing facility Living Arrangements: Spouse/significant other                                      Prior Functioning/Environment Level of Independence:  (Pt unable to provide at this time)             OT Diagnosis: Acute pain   OT Problem List:     OT Treatment/Interventions: Self-care/ADL training    OT Goals(Current goals can be found in the care plan section)  Acute Rehab OT Goals Patient Stated Goal: to get rehab OT Goal Formulation: With patient Time For Goal Achievement: 06/14/15 Potential to Achieve Goals: Good  OT Frequency: Min 1X/week   Barriers to D/C:            Co-evaluation              End of Session Equipment Utilized During Treatment:  (hip kit)  Activity Tolerance: Patient limited by fatigue;Patient limited by lethargy Patient left: in chair;with call bell/phone within reach;with chair alarm set   Time: 1100-1121 OT Time Calculation (min): 21 min Charges:  OT General Charges $OT Visit: 1 Procedure OT Evaluation $Initial OT Evaluation Tier I: 1 Procedure OT Treatments $Self Care/Home Management : 8-22 mins G-Codes:    Myrene Galas, MS/OTR/L  05/31/2015, 11:35 AM

## 2015-05-31 NOTE — Progress Notes (Signed)
Informed day shift RN that Foley was not removed. Pt not mobile at all in bed, and has continuous severe pain with very little to no control with medications. Day shift RN will re-eval need for Foley after Physical Therapy evaluates.

## 2015-05-31 NOTE — Progress Notes (Signed)
[facility]  Date of admission:  05/30/2015  Inpatient day:  05/31/2015  Consulting physician:  Dr. Hessie Knows   Reason for consultation:  Need for anticoagulation  Chief Complaint: Amy Sweeney is a 64 y.o. female with a history of a pulmonary embolism who is admitted for right total knee replacement.  HPI: The patient is diagnosed with pulmonary embolism on 02/14/2014.  She had undergone external fixation of a right tibial plateau fracture on 01/15/2014.  Risk factors for thrombosis were felt to secondary to her surgery, subsequent immobilization, and her weight. She has no prior history of thrombosis or family history of thrombosis.  Patient has been followed by Dr. Samuel Germany, hematologist, at [facility]. She apparently underwent a hypercoagulable workup (no results available).  Discussions were held with the patient regarding anticoagulation. Given her history of gastric bypass surgery and "not wanting to take a chance on oral anticoagulation", the patient and Dr. Mariam Dollar opted for a Lovenox.  Duration of anticoagulation at last visit on 03/19/2015 was for 3-6 months until all orthopedic surgery was complete.  Plan was to continue anticoagulation for 4-6 weeks beyond last surgery.  In anticipation of her knee replacement, the patient underwent IVC filter placement by Dr. Ronalee Belts on 05/21/2015.  Since surgery, the patient notes "a lot of pain" in her knee.  Past Medical History  Diagnosis Date  . Family history of adverse reaction to anesthesia     father blood clot after surgery died  . Bipolar disorder   . Diabetes mellitus without complication   . Anxiety   . Headache     history of migraines  . Coronary artery disease   . Arthritis     degenerative artgritis  osteoarthritis    Past Surgical History  Procedure Laterality Date  . Cesarean section      x2  . Dilation and curettage of uterus    . Neck surgery      removed the buldge of the disc  and hardware.  . Laminectomy  2014  . Gastric bypass  2004  . Knee arthroscopy Left 2004 & 2006  . Cholecystectomy    . Cardiac catheterization  2004  . Peripheral vascular catheterization N/A 05/21/2015    Procedure: IVC Filter Insertion;  Surgeon: Katha Cabal, MD;  Location: [place] [facility];  Service: Cardiovascular;  Laterality: N/A;  . Total knee arthroplasty Right 05/30/2015    Procedure: TOTAL KNEE ARTHROPLASTY;  Surgeon: Hessie Knows, MD;  Location: [facility] ORS;  Service: Orthopedics;  Laterality: Right;    History reviewed. No pertinent family history.  Social History:  reports that she has never smoked. She does not have any smokeless tobacco history on file. She reports that she does not drink alcohol or use illicit drugs.  The patient is alone today.  Allergies:  Allergies  Allergen Reactions  . Bee Venom Anaphylaxis  . Morphine And Related Anaphylaxis  . Adhesive [Tape] Other (See Comments)    Itching and water blisters. Paper tape is OK.  . Celebrex [Celecoxib] Swelling  . Erythromycin Rash  . Tegaderm Ag Mesh [Silver] Other (See Comments)    itching and water blisters itching and water blisters  . Levaquin [Levofloxacin In D5w] Diarrhea    Medications Prior to Admission  Medication Sig Dispense Refill  . acetaminophen (TYLENOL) 500 MG tablet Take 1,500 mg by mouth every 8 (eight) hours as needed.    . baclofen (LIORESAL) 10 MG tablet Take 5 mg by mouth 2 (  two) times daily. As needed for muscle spasms    . CALCIUM CITRATE PO Take 600 mg by mouth. 4 tablets taken every am    . Cholecalciferol (VITAMIN D3) 5000 UNITS CAPS Take 5 capsules by mouth. 5 capsules every am    . clonazePAM (KLONOPIN) 1 MG tablet Take 1 mg by mouth 3 (three) times daily as needed for anxiety.     . Cyanocobalamin (VITAMIN B-12) 2500 MCG SUBL Place 2 tablets under the tongue every morning.    . diphenhydrAMINE (SOMINEX) 25 MG tablet Take 50 mg by mouth at bedtime.    . divalproex  (DEPAKOTE) 500 MG DR tablet Take 1,500 mg by mouth every morning. 3 tablets every am    . DULoxetine (CYMBALTA) 60 MG capsule Take 60 mg by mouth daily. 2 capsules in the am    . enoxaparin (LOVENOX) 100 MG/ML injection Inject 80 mg into the skin every 12 (twelve) hours.    . fentaNYL (DURAGESIC - DOSED MCG/HR) 50 MCG/HR Place 50 mcg onto the skin every 3 (three) days.    . ferrous sulfate 325 (65 FE) MG tablet Take 325 mg by mouth daily with breakfast. 2 tablets in am    . fluticasone (FLONASE) 50 MCG/ACT nasal spray Place 2 sprays into both nostrils every morning.    . gabapentin (NEURONTIN) 300 MG capsule Take 1,200 mg by mouth every 6 (six) hours.     . Ginkgo Biloba 60 MG CAPS Take 2 capsules by mouth every morning.    . Magnesium Oxide -Mg Supplement 250 MG TABS Take 2 tablets by mouth every morning.    . metFORMIN (GLUCOPHAGE) 1000 MG tablet Take 1,000 mg by mouth daily with breakfast. 2 capsules at breakfast    . Multiple Vitamins-Minerals (ONE-A-DAY WOMENS 50 PLUS PO) Take 2 tablets by mouth every morning.    . Oxycodone HCl 10 MG TABS Take 10 mg by mouth every 8 (eight) hours as needed.    . traZODone (DESYREL) 100 MG tablet Take 100 mg by mouth at bedtime.    . vitamin E 1000 UNIT capsule Take 1,000 Units by mouth daily. 2 capsules in am    . Zinc 50 MG TABS Take 100 mg by mouth. In am      Review of Systems: GENERAL:  Uncomfortable secondary to pain.  No fevers, sweats or weight loss. PERFORMANCE STATUS (ECOG):  2 HEENT:  No visual changes, runny nose, sore throat, mouth sores or tenderness. Lungs: No shortness of breath or cough.  No hemoptysis. Cardiac:  No chest pain, palpitations, orthopnea, or PND. GI:  No nausea, vomiting, diarrhea, constipation, melena or hematochezia. GU:  No urgency, frequency, dysuria, or hematuria. Musculoskeletal:  Pain in right knee post op..  No muscle tenderness. Extremities:  No pain or swelling. Skin:  No rashes or skin changes. Neuro:  No  headache, numbness or weakness, balance or coordination issues. Endocrine:  Diabetes.  No thyroid issues, hot flashes or night sweats. Psych:  No mood changes, depression or anxiety. Pain:  Post-operative pain. Review of systems:  All other systems reviewed and found to be negative.  Physical Exam:  Blood pressure 170/83, pulse 105, temperature 100 F (37.8 C), temperature source Oral, resp. rate 18, height 5' 8"  (1.727 m), weight 253 lb (114.76 kg), SpO2 95 %.  GENERAL:  Well developed, well nourished, overweight woman sitting comfortably on the medical unit in no acute distress. MENTAL STATUS:  Alert and oriented to person, place and time. HEAD:  Long brown wig.  Normocephalic, atraumatic, face symmetric, no Cushingoid features. EYES:  Glasses.  Brown eyes.  Pupils equal round and reactive to light and accomodation.  No conjunctivitis or scleral icterus. ENT:  Red lipstick.  Oropharynx clear without lesion.  Tongue normal. Mucous membranes moist.  RESPIRATORY:  Clear to auscultation without rales, wheezes or rhonchi. CARDIOVASCULAR:  Regular rate and rhythm without murmur, rub or gallop. ABDOMEN:  Soft, non-tender, with active bowel sounds, and no appreciable hepatosplenomegaly.  No masses. SKIN:  No rashes, ulcers or lesions. EXTREMITIES: Right knee wrapped post surgery.  Left sided [facility].  No skin discoloration or tenderness.  No palpable cords. LYMPH NODES: No palpable cervical, supraclavicular, axillary or inguinal adenopathy  NEUROLOGICAL: Unremarkable. PSYCH:  Appropriate.  Results for orders placed or performed during the hospital encounter of 05/30/15 (from the past 48 hour(s))  Glucose, capillary     Status: Abnormal   Collection Time: 05/30/15  8:50 AM  Result Value Ref Range   Glucose-Capillary 125 (H) 65 - 99 mg/dL  Type and screen     Status: None   Collection Time: 05/30/15  9:03 AM  Result Value Ref Range   ABO/RH(D) A POS    Antibody Screen NEG    Sample  Expiration 06/02/2015   Glucose, capillary     Status: Abnormal   Collection Time: 05/30/15  2:16 PM  Result Value Ref Range   Glucose-Capillary 135 (H) 65 - 99 mg/dL  CBC     Status: Abnormal   Collection Time: 05/30/15  3:42 PM  Result Value Ref Range   WBC 5.8 3.6 - 11.0 K/uL   RBC 3.67 (L) 3.80 - 5.20 MIL/uL   Hemoglobin 11.5 (L) 12.0 - 16.0 g/dL   HCT 35.5 35.0 - 47.0 %   MCV 96.8 80.0 - 100.0 fL   MCH 31.4 26.0 - 34.0 pg   MCHC 32.4 32.0 - 36.0 g/dL   RDW 13.5 11.5 - 14.5 %   Platelets 222 150 - 440 K/uL  Creatinine, serum     Status: None   Collection Time: 05/30/15  3:42 PM  Result Value Ref Range   Creatinine, Ser 0.61 0.44 - 1.00 mg/dL   GFR calc non Af Amer >60 >60 mL/min   GFR calc Af Amer >60 >60 mL/min    Comment: (NOTE) The eGFR has been calculated using the CKD EPI equation. This calculation has not been validated in all clinical situations. eGFR's persistently <60 mL/min signify possible Chronic Kidney Disease.   Glucose, capillary     Status: Abnormal   Collection Time: 05/30/15  9:04 PM  Result Value Ref Range   Glucose-Capillary 166 (H) 65 - 99 mg/dL   Comment 1 Notify RN   CBC     Status: Abnormal   Collection Time: 05/31/15  6:00 AM  Result Value Ref Range   WBC 8.4 3.6 - 11.0 K/uL   RBC 3.68 (L) 3.80 - 5.20 MIL/uL   Hemoglobin 11.8 (L) 12.0 - 16.0 g/dL   HCT 35.1 35.0 - 47.0 %   MCV 95.5 80.0 - 100.0 fL   MCH 32.1 26.0 - 34.0 pg   MCHC 33.6 32.0 - 36.0 g/dL   RDW 13.3 11.5 - 14.5 %   Platelets 230 150 - 440 K/uL  Basic metabolic panel     Status: Abnormal   Collection Time: 05/31/15  6:00 AM  Result Value Ref Range   Sodium 131 (L) 135 - 145 mmol/L   Potassium 4.6  3.5 - 5.1 mmol/L   Chloride 95 (L) 101 - 111 mmol/L   CO2 27 22 - 32 mmol/L   Glucose, Bld 217 (H) 65 - 99 mg/dL   BUN 10 6 - 20 mg/dL   Creatinine, Ser 0.63 0.44 - 1.00 mg/dL   Calcium 8.6 (L) 8.9 - 10.3 mg/dL   GFR calc non Af Amer >60 >60 mL/min   GFR calc Af Amer >60 >60  mL/min    Comment: (NOTE) The eGFR has been calculated using the CKD EPI equation. This calculation has not been validated in all clinical situations. eGFR's persistently <60 mL/min signify possible Chronic Kidney Disease.    Anion gap 9 5 - 15  Glucose, capillary     Status: Abnormal   Collection Time: 05/31/15  8:25 AM  Result Value Ref Range   Glucose-Capillary 217 (H) 65 - 99 mg/dL   Comment 1 Notify RN   Glucose, capillary     Status: Abnormal   Collection Time: 05/31/15 11:19 AM  Result Value Ref Range   Glucose-Capillary 212 (H) 65 - 99 mg/dL   Comment 1 Notify RN    Dg Knee 1-2 Views Right  05/30/2015   CLINICAL DATA:  Postop right knee replacement  EXAM: RIGHT KNEE - 1-2 VIEW  COMPARISON:  01/14/2014  FINDINGS: Tricompartmental knee prosthesis with components in anticipated position. Anticipated postoperative change over the joint including air and fluid in the joint. Small joint effusion. Drainage catheter projects over the joint as well.  IMPRESSION: Anticipated postoperative appearance   Electronically Signed   By: Skipper Cliche M.D.   On: 05/30/2015 14:04    Assessment:  The patient is a 64 y.o. woman with a history of pulmonary embolism on 02/14/2014 approximately 1 month following a right tibial fracture.  Risk factors for thrombosis included her surgery, subsequent immobolization, and her weight.  Hypercoagulable work-up is unavailable.  She has a history of gastric bypass surgery with a decision by the patient and her hematologist at [facility] to treat with Lovenox.  She has completed a course of Lovenox for her initial clot.  She is felt to need anticoagulation for 4-6 weeks beyond her last surgery.  She has an IVC filter in place.  Plan:   1.  Resume full dose Lovenox when appropriate per orthopedic surgery. 2.  Phone message left at Dr. Tammi Klippel office. 3.  Discussed anticoagulation with patient.    Thank you for allowing me to participate in MAREE AINLEY 's  care.  I will follow her closely with you while hospitalized.  Lequita Asal, MD  05/31/2015, 12:27 PM

## 2015-05-31 NOTE — Progress Notes (Signed)
Physical Therapy Treatment Patient Details Name: Amy Sweeney MRN: [id number] DOB: 1950/12/22 Today's Date: 05/31/2015    History of Present Illness Pt with considerable history of R knee issues, had TKA and is having a lot of pain     PT Comments    Pt has a very hard time participating appropriately with PT.  She is very lethargic, very pain focused, screams out with nearly any activity and ultimately needs excessive cuing and encouragement for partial participation. She is highly limited and is unable to stay focused on what we were trying to accomplish during PT. Pt has a very hard time with any WBing/standing.     Follow Up Recommendations  SNF     Equipment Recommendations       Recommendations for Other Services       Precautions / Restrictions Precautions Precautions: Fall Required Braces or Orthoses: Knee Immobilizer - Right Knee Immobilizer - Right: Discontinue once straight leg raise with < 10 degree lag Restrictions RLE Weight Bearing: Weight bearing as tolerated    Mobility  Bed Mobility Overal bed mobility: Needs Assistance Bed Mobility: Sit to Supine       Sit to supine: Min assist;Mod assist   General bed mobility comments: Pt again very pain limited and shows hesitancy  Transfers Overall transfer level: Needs assistance Equipment used: Rolling walker (2 wheeled) Transfers: Sit to/from Stand Sit to Stand: Max assist;+2 physical assistance         General transfer comment: Pt shows some minimal effort to get to standing, does need a lot of encouragement and cuing.   Ambulation/Gait Ambulation/Gait assistance: Total assist;+2 physical assistance           General Gait Details: Pt able to make an attempt at moving her feet with heavy, heavy 2 person assist to [facility] her LEs.  Pt essentially unable to meaningfully take weight in standing or attempt stepping   Stairs            Wheelchair Mobility    Modified Rankin (Stroke  Patients Only)       Balance                                    Cognition Arousal/Alertness: Lethargic Behavior During Therapy: Restless;Anxious;Agitated Overall Cognitive Status:  (Pt essentially speaking off topic or about pain t/o PT)                      Exercises Total Joint Exercises Ankle Circles/Pumps: AROM;10 reps Quad Sets: AROM;10 reps Heel Slides: PROM (pt screaming t/o all exercises) Hip ABduction/ADduction: AAROM;10 reps    General Comments        Pertinent Vitals/Pain Pain Assessment: 0-10 Pain Score: 10-Worst pain ever    Home Living                      Prior Function            PT Goals (current goals can now be found in the care plan section) Progress towards PT goals: PT to reassess next treatment    Frequency  BID    PT Plan Current plan remains appropriate    Co-evaluation             End of Session           Time: 1358-1440 PT Time Calculation (min) (ACUTE ONLY): 42 min  Charges:  $  Therapeutic Exercise: 23-37 mins $Therapeutic Activity: 8-22 mins                    G Codes:     Loran Senters, PT, DPT [phone]  Malachi Pro 05/31/2015, 4:55 PM

## 2015-05-31 NOTE — Clinical Social Work Placement (Signed)
   CLINICAL SOCIAL WORK PLACEMENT  NOTE  Date:  05/31/2015  Patient Details  Name: TYLENA PRISK MRN: [id number] Date of Birth: [birth year]  Clinical Social Work is seeking post-discharge placement for this patient at the Skilled  Nursing Facility level of care (*CSW will initial, date and re-position this form in  chart as items are completed):  Yes   Patient/family provided with [facility] Clinical Social Work Department's list of facilities offering this level of care within the geographic area requested by the patient (or if unable, by the patient's family).  Yes   Patient/family informed of their freedom to choose among providers that offer the needed level of care, that participate in Medicare, Medicaid or managed care program needed by the patient, have an available bed and are willing to accept the patient.  Yes   Patient/family informed of [facility]'s ownership interest in [facility] and [facility], as well as of the fact that they are under no obligation to receive care at these facilities.  PASRR submitted to EDS on       PASRR number received on       Existing PASRR number confirmed on 05/31/15     FL2 transmitted to all facilities in geographic area requested by pt/family on 05/31/15     FL2 transmitted to all facilities within larger geographic area on       Patient informed that his/her managed care company has contracts with or will negotiate with certain facilities, including the following:            Patient/family informed of bed offers received.  Patient chooses bed at       Physician recommends and patient chooses bed at      Patient to be transferred to   on  .  Patient to be transferred to facility by       Patient family notified on   of transfer.  Name of family member notified:        PHYSICIAN Please sign FL2     Additional Comment:  CSW will f/u with offers once  received.  _______________________________________________ Chauncy Passy, LCSW 05/31/2015, 4:18 PM

## 2015-05-31 NOTE — Progress Notes (Deleted)
Pt complaining of constipation, will administer laxative as ordered.

## 2015-05-31 NOTE — Progress Notes (Signed)
Pt complaining of constipation, will administer laxative as ordered.Tried to administer and pt refused. Had BM.

## 2015-05-31 NOTE — Progress Notes (Signed)
Assumed patient care at 2300 pm on 02/27/15 and pt has been unable to dangle this shift per intolerance to movement. Does not want to be moved in any position at all. Will not assist staff with movement. Calling out continuous when she is not asleep that her knee hurts. Pain medication administered per assessment and complaints.

## 2015-05-31 NOTE — Care Management (Signed)
RNCM consult received and will continue to follow PT is recommending SNF and patient agrees.

## 2015-05-31 NOTE — Evaluation (Signed)
Physical Therapy Evaluation Patient Details Name: Amy Sweeney MRN: [id number] DOB: [birth year] Today's Date: 05/31/2015   History of Present Illness  Pt underwent R TKR and is POD#1 at time of evaluation. According to staff and medical record pt was reporting high levels of pain and yelling out most of the night last night.   Clinical Impression  Pt is very lethargic and confused during evaulation. She is AOx2 but is unable to provide date or any other useful information regarding prior level of function. Once cognitive status is improved she will need to provide further details about living situation and prior function. Pt yells during all bed mobility demonstrating poor RLE strength. She performs stand pivot transfer from bed to chair with maxA+2. Pt will need SNF placement at discharge in order to return to prior level of function. Pt will benefit from skilled PT services to address deficits in strength, balance, and mobility in order to return to full function at home.     Follow Up Recommendations SNF    Equipment Recommendations   (Unable to assess at this time)    Recommendations for Other Services       Precautions / Restrictions Precautions Precautions: Fall Required Braces or Orthoses: Knee Immobilizer - Right Knee Immobilizer - Right: Discontinue once straight leg raise with < 10 degree lag Restrictions Weight Bearing Restrictions: Yes RLE Weight Bearing: Weight bearing as tolerated      Mobility  Bed Mobility Overal bed mobility: Needs Assistance;+2 for physical assistance Bed Mobility: Supine to Sit     Supine to sit: Max assist;+2 for physical assistance     General bed mobility comments: Poor participation. Pt yells in pain when upright at EOB. Poor sequencing and strength noted  Transfers Overall transfer level: Needs assistance Equipment used: Rolling walker (2 wheeled) Transfers: Sit to/from Stand Sit to Stand: Max assist;+2 physical assistance          General transfer comment: Pt with poor sequencing and anterior weight shifting. Pt demonstrates poor weight acceptance to RLE. Requires 3 attempts to come to standing. Pt able to perform stand pivot transfer to recliner. Unable to ambulate at this time.  Ambulation/Gait                Stairs            Wheelchair Mobility    Modified Rankin (Stroke Patients Only)       Balance Overall balance assessment: Needs assistance   Sitting balance-Leahy Scale: Fair       Standing balance-Leahy Scale: Good                               Pertinent Vitals/Pain Pain Assessment: 0-10 Pain Score: 10-Worst pain ever Pain Location: eyes (chronic); 10/10 R knee pain Pain Intervention(s): Limited activity within patient's tolerance    Home Living Family/patient expects to be discharged to:: Skilled nursing facility Living Arrangements: Spouse/significant other                    Prior Function Level of Independence:  (Pt unable to provide at this time)               Hand Dominance        Extremity/Trunk Assessment   Upper Extremity Assessment:  (B UE 4/5)           Lower Extremity Assessment: Defer to PT evaluation RLE Deficits / Details: Unable to  perform SLR or SAQ on RLE without assistance. Pt yells out in pain with movement of RLE. LLE appears grossly WFL with transfers and ambulation       Communication   Communication: Other (comment) (Confused and lethargic)  Cognition Arousal/Alertness: Lethargic Behavior During Therapy: Anxious Overall Cognitive Status: No family/caregiver present to determine baseline cognitive functioning                      General Comments      Exercises Total Joint Exercises Ankle Circles/Pumps: Strengthening;Both;10 reps;Supine Quad Sets: Strengthening;Both;10 reps;Supine Towel Squeeze: Strengthening;Both;10 reps;Supine Short Arc Quad: Strengthening;Both;10 reps;Supine Hip  ABduction/ADduction: Strengthening;Both;10 reps;Supine Straight Leg Raises: Strengthening;Both;10 reps;Supine      Assessment/Plan    PT Assessment Patient needs continued PT services  PT Diagnosis Difficulty walking;Abnormality of gait;Generalized weakness;Acute pain   PT Problem List Decreased strength;Decreased range of motion;Decreased activity tolerance;Decreased balance;Decreased mobility;Decreased cognition;Decreased knowledge of use of DME;Decreased safety awareness;Pain  PT Treatment Interventions DME instruction;Gait training;Stair training;Functional mobility training;Therapeutic activities;Therapeutic exercise;Balance training;Neuromuscular re-education;Manual techniques   PT Goals (Current goals can be found in the Care Plan section) Acute Rehab PT Goals Patient Stated Goal: to get rehab    Frequency BID   Barriers to discharge        Co-evaluation               End of Session Equipment Utilized During Treatment: Gait belt Activity Tolerance: Patient limited by pain Patient left: in chair;with call bell/phone within reach;with chair alarm set;with SCD's reapplied (towel roll under heel, polar care in place) Nurse Communication: Mobility status         Time: 1030-1100 PT Time Calculation (min) (ACUTE ONLY): 30 min   Charges:   PT Evaluation $Initial PT Evaluation Tier I: 1 Procedure PT Treatments $Therapeutic Exercise: 8-22 mins   PT G Codes:       Sharalyn Ink Huprich PT, DPT   Huprich,Jason 05/31/2015, 11:35 AM

## 2015-06-01 LAB — BASIC METABOLIC PANEL
ANION GAP: 9 (ref 5–15)
BUN: 8 mg/dL (ref 6–20)
CO2: 31 mmol/L (ref 22–32)
Calcium: 9.3 mg/dL (ref 8.9–10.3)
Chloride: 93 mmol/L — ABNORMAL LOW (ref 101–111)
Creatinine, Ser: 0.71 mg/dL (ref 0.44–1.00)
GFR calc Af Amer: >60 mL/min (ref >60)
Glucose, Bld: 240 mg/dL — ABNORMAL HIGH (ref 65–99)
POTASSIUM: 4.3 mmol/L (ref 3.5–5.1)
SODIUM: 133 mmol/L — AB (ref 135–145)

## 2015-06-01 LAB — CBC
HEMATOCRIT: 35.1 % (ref 35.0–47.0)
HEMOGLOBIN: 12 g/dL (ref 12.0–16.0)
MCH: 32.9 pg (ref 26.0–34.0)
MCHC: 34.1 g/dL (ref 32.0–36.0)
MCV: 96.4 fL (ref 80.0–100.0)
Platelets: 238 10*3/uL (ref 150–440)
RBC: 3.64 MIL/uL — ABNORMAL LOW (ref 3.80–5.20)
RDW: 13.4 % (ref 11.5–14.5)
WBC: 9.9 10*3/uL (ref 3.6–11.0)

## 2015-06-01 LAB — GLUCOSE, CAPILLARY
GLUCOSE-CAPILLARY: 213 mg/dL — AB (ref 65–99)
GLUCOSE-CAPILLARY: 217 mg/dL — AB (ref 65–99)
Glucose-Capillary: 212 mg/dL — ABNORMAL HIGH (ref 65–99)
Glucose-Capillary: 199 mg/dL — ABNORMAL HIGH (ref 65–99)

## 2015-06-01 MED ORDER — NYSTATIN 100000 UNIT/GM EX POWD
Freq: Two times a day (BID) | CUTANEOUS | Status: DC
  Administered 2015-06-01 – 2015-06-02 (×3): via TOPICAL
  Filled 2015-06-01: qty 15

## 2015-06-01 NOTE — Progress Notes (Signed)
Redness noted in patients groin- Thayer Ohm gave okay for nystatin

## 2015-06-01 NOTE — Progress Notes (Signed)
Physical Therapy Treatment Patient Details Name: Amy Sweeney MRN: [id number] DOB: October 27, 1950 Today's Date: 06/01/2015    History of Present Illness Pt with considerable history of R knee issues, had TKA and is having a lot of pain     PT Comments    Pt continues to be very pain focused and limited, but is able to balance her screaming out and relaxing better this session.  She makes a very poor safety decision and instead of trying to walk with assist to the recliner she reached out and pivoted over to the recliner despite heavy and explicit cuing before attempting to walk.  Pt unable to engage quads during SAQ and needs regular calming rest breaks between exercises.   Follow Up Recommendations  SNF     Equipment Recommendations       Recommendations for Other Services       Precautions / Restrictions Precautions Precautions: Fall Required Braces or Orthoses: Knee Immobilizer - Right Restrictions RLE Weight Bearing: Weight bearing as tolerated    Mobility  Bed Mobility Overal bed mobility: Needs Assistance Bed Mobility: Sit to Supine;Supine to Sit     Supine to sit: Mod assist;Min assist     General bed mobility comments: Pt does much better with ability to get to EOB w/o heavy assist, much less calling out in pain  Transfers Overall transfer level: Needs assistance Equipment used: Rolling walker (2 wheeled) Transfers: Sit to/from Stand Sit to Stand: Max assist;Mod assist         General transfer comment: Pt better able to attempt getting up on her own, uses UEs more appropriately and is unable to   Ambulation/Gait             General Gait Details: Pt does much "better" with getting to recliner today, but she was unable to even attempt taking a step and simply reached over to the recliner (letting go of the walker) and pivoting her hips down into the chair despite much cuing to use walker and try to do some actual stepping.    Stairs             Wheelchair Mobility    Modified Rankin (Stroke Patients Only)       Balance                                    Cognition Arousal/Alertness: Lethargic Behavior During Therapy: Restless;Agitated (pt with much less loud screaming today)                        Exercises Total Joint Exercises Ankle Circles/Pumps: AROM;10 reps Quad Sets: 10 reps;Strengthening Short Arc Quad: Strengthening;Both;10 reps;Supine Heel Slides: 5 reps;AAROM (resisted extension and gentle extension overpressure) Hip ABduction/ADduction: AAROM;10 reps Goniometric ROM: 8-74    General Comments        Pertinent Vitals/Pain Pain Assessment: 0-10 Pain Score: 10-Worst pain ever    Home Living                      Prior Function            PT Goals (current goals can now be found in the care plan section) Progress towards PT goals: Progressing toward goals    Frequency  BID    PT Plan Current plan remains appropriate    Co-evaluation  End of Session           Time: 1610-9604 PT Time Calculation (min) (ACUTE ONLY): 26 min  Charges:  $Therapeutic Exercise: 8-22 mins $Therapeutic Activity: 8-22 mins                    G Codes:     Loran Senters, PT, DPT [phone]  Amy Sweeney 06/01/2015, 2:41 PM

## 2015-06-01 NOTE — Progress Notes (Signed)
   Subjective: 2 Days Post-Op Procedure(s) (LRB): TOTAL KNEE ARTHROPLASTY (Right) Patient reports pain as moderate Patient is having a hard time staying awake. Complains of severe pain in right knee. We will continue with physical therapy Tolerating po well. No CP/sob/abd pain    Objective: Vital signs in last 24 hours: Temp:  [98.4 F (36.9 C)-99.2 F (37.3 C)] 99.2 F (37.3 C) (09/17 0804) Pulse Rate:  [107-132] 110 (09/17 0804) Resp:  [18] 18 (09/17 0804) BP: (142-169)/(81-116) 158/86 mmHg (09/17 0804) SpO2:  [91 %-98 %] 98 % (09/17 0804)  Intake/Output from previous day: 09/16 0701 - 09/17 0700 In: 1401.7 [P.O.:360; I.V.:1041.7] Out: 2950 [Urine:2950] Intake/Output this shift: Total I/O In: -  Out: 600 [Urine:600]   Recent Labs  05/30/15 1542 05/31/15 0600 06/01/15 0329  HGB 11.5* 11.8* 12.0    Recent Labs  05/31/15 0600 06/01/15 0329  WBC 8.4 9.9  RBC 3.68* 3.64*  HCT 35.1 35.1  PLT 230 238    Recent Labs  05/31/15 0600 06/01/15 0329  NA 131* 133*  K 4.6 4.3  CL 95* 93*  CO2 27 31  BUN 10 8  CREATININE 0.63 0.71  GLUCOSE 217* 240*  CALCIUM 8.6* 9.3   No results for input(s): LABPT, INR in the last 72 hours.  EXAM General - Patient is Alert, Appropriate and Oriented Extremity - Neurovascular intact Sensation intact distally Intact pulses distally Dorsiflexion/Plantar flexion intact Dressing - dressing C/D/I, changed to honeycomb Motor Function - intact, moving foot and toes well on exam.   Past Medical History  Diagnosis Date  . Family history of adverse reaction to anesthesia     father blood clot after surgery died  . Bipolar disorder   . Diabetes mellitus without complication   . Anxiety   . Headache     history of migraines  . Coronary artery disease   . Arthritis     degenerative artgritis  osteoarthritis    Assessment/Plan:   2 Days Post-Op Procedure(s) (LRB): TOTAL KNEE ARTHROPLASTY (Right) Active Problems:  Traumatic arthritis of knee   Hyponatremia  Estimated body mass index is 38.48 kg/(m^2) as calculated from the following:   Height as of this encounter:  (1.727 m).   Weight as of this encounter: 253 lb (114.76 kg). Advance diet Up with therapy  Needs BM Discharge to rehab Sunday or Monday   DVT Prophylaxis - Lovenox, Foot Pumps and TED hose Weight-Bearing as tolerated to right leg D/C O2 and Pulse OX and try on Room Air  T. Cranston Neighbor, PA-C [facility] 06/01/2015, 8:24 AM

## 2015-06-01 NOTE — Clinical Social Work Note (Signed)
Clinical Social Work Assessment  Patient Details  Name: Amy Sweeney MRN: [id number] Date of Birth: 05/04/1951  Date of referral:  05/31/15               Reason for consult:  Facility Placement                Permission sought to share information with:  Family Supports Permission granted to share information::  Yes, Verbal Permission Granted  Name::     Amy Sweeney  Agency::  SNFs  Relationship::  husband  Contact Information:     Housing/Transportation Living arrangements for the past 2 months:  Single Family Home Source of Information:  Patient, Medical Team, Spouse Patient Interpreter Needed:  [place] Criminal Activity/Legal Involvement Pertinent to Current Situation/Hospitalization:  No - Comment as needed Significant Relationships:  Adult Children, Spouse Lives with:  Spouse Do you feel safe going back to the place where you live?  No Need for family participation in patient care:  Yes (Comment)  Care giving concerns:  Pt lives with her husband of 16 years. Pt's children live out of area, daughter lives in [place] and son in [place]. Pt's stepchildren live out of state. Pt's husband works as a Scientist, research (life sciences).    Social Worker assessment / plan:  CSW was referred to Pt to assist with dc planning. Pt is married, has not worked in many years due to health issues.  Pt is too lethargic to participate in dc planning so her husband participated in assessment with CSW.  Pt's husband reports that Pt has been to SNF before in [place] and [place]. They would like option for STR in [place]. CSW provided Pt's husband a list of facilities and discussed that options will vary depending on whether patient discharges on Sunday or Monday.  CSW will follow upon Sunday for report on Pt's progression.   Employment status:  Disabled (Comment on whether or not currently receiving Disability) Insurance information:  Medicare PT Recommendations:  Skilled Nursing  Facility Information / Referral to community resources:  Skilled Nursing Facility  Patient/Family's Response to care:  Pt was asleep during assessment. CSW will return on Sunday when Pt is more awake. Pt's surgery was planned and Pt's husband reports that she did plan on going to SNF at dc but did not discuss which facility she was most interested in going to for rehab.  Patient/Family's Understanding of and Emotional Response to Diagnosis, Current Treatment, and Prognosis: Pt's husband reports understanding of process of recovery for patient. Pt's husband reports that the Pt had difficulty before at SNF when Pt didn't get her medications on time.    Emotional Assessment Appearance:  Appears stated age, Well-Groomed Attitude/Demeanor/Rapport:  Lethargic Affect (typically observed):  Unable to Assess Orientation:  Oriented to Self, Oriented to Place, Oriented to Situation Alcohol / Substance use:  Never Used Psych involvement (Current and /or in the community):  No (Comment)  Discharge Needs  Concerns to be addressed:  Adjustment to Illness; dc planning Readmission within the last 30 days:  No Current discharge risk:  Dependent with Mobility Barriers to Discharge:  Continued Medical Work up   [employer], LCSW 06/01/2015, 10:51 PM

## 2015-06-01 NOTE — Progress Notes (Signed)
Physical Therapy Treatment Patient Details Name: Amy Sweeney MRN: [id number] DOB: [birth year] Today's Date: 06/01/2015    History of Present Illness Pt with considerable history of R knee issues, had TKA and is having a lot of pain     PT Comments    Pt continues to be very guarded, and needs excessive time and caution with all acts.  She was able to do a few SAQ with AROM extension but remains very weak and unable to tolerate much with exercises.  Again pt needs excessive  encouragement with almost every activity and heavy cuing to breath and stay relaxed.  Follow Up Recommendations  SNF     Equipment Recommendations       Recommendations for Other Services       Precautions / Restrictions Precautions Precautions: Fall Required Braces or Orthoses: Knee Immobilizer - Right Restrictions RLE Weight Bearing: Weight bearing as tolerated    Mobility  Bed Mobility Overal bed mobility: Needs Assistance Bed Mobility: Sit to Supine;Supine to Sit     Supine to sit: Mod assist;Min assist     General bed mobility comments: Pt had gotten back into bed with nursing and does not want to get up again. On offer to have help getting to the chair for dinner she reports that her osteoporosis is bothering her too much from sitting for 2 hours earlier today and she refuses,  Transfers Overall transfer level: Needs assistance Equipment used: Rolling walker (2 wheeled) Transfers: Sit to/from Stand Sit to Stand: Max assist;Mod assist         General transfer comment: Pt better able to attempt getting up on her own, uses UEs more appropriately and is unable to   Ambulation/Gait             General Gait Details: Pt does much "better" with getting to recliner today, but she was unable to even attempt taking a step and simply reached over to the recliner (letting go of the walker) and pivoting her hips down into the chair despite much cuing to use walker and try to do some actual  stepping.    Stairs            Wheelchair Mobility    Modified Rankin (Stroke Patients Only)       Balance                                    Cognition Arousal/Alertness: Lethargic Behavior During Therapy: Restless;Agitated Overall Cognitive Status: No family/caregiver present to determine baseline cognitive functioning                      Exercises Total Joint Exercises Ankle Circles/Pumps: AROM;10 reps Quad Sets: 10 reps;Strengthening;AROM Short Arc Quad: 10 reps;AAROM Heel Slides: 5 reps;AAROM Hip ABduction/ADduction: AAROM;10 reps Goniometric ROM: 8-74    General Comments        Pertinent Vitals/Pain Pain Assessment: 0-10 Pain Score: 10-Worst pain ever    Home Living                      Prior Function            PT Goals (current goals can now be found in the care plan section) Progress towards PT goals: PT to reassess next treatment    Frequency  BID    PT Plan Current plan remains appropriate    Co-evaluation  End of Session   Activity Tolerance: Patient limited by fatigue;Patient limited by pain Patient left: with bed alarm set     Time: 4098-1191 PT Time Calculation (min) (ACUTE ONLY): 18 min  Charges:  $Therapeutic Exercise: 8-22 mins $Therapeutic Activity: 8-22 mins                    G Codes:     Loran Senters, PT, DPT [phone]  Malachi Pro 06/01/2015, 5:13 PM

## 2015-06-02 MED ORDER — OXYCODONE HCL ER 20 MG PO T12A
20.0000 mg | EXTENDED_RELEASE_TABLET | Freq: Two times a day (BID) | ORAL | Status: DC
Start: 2015-06-02 — End: 2016-07-22

## 2015-06-02 MED ORDER — FENTANYL 50 MCG/HR TD PT72: 50.0000 ug | MEDICATED_PATCH | TRANSDERMAL | Status: DC

## 2015-06-02 MED ORDER — OXYCODONE HCL 5 MG PO TABS: 5.0000 mg | ORAL_TABLET | ORAL | Status: DC | PRN

## 2015-06-02 MED ORDER — ENOXAPARIN SODIUM 30 MG/0.3ML ~~LOC~~ SOLN: 30.0000 mg | Freq: Two times a day (BID) | SUBCUTANEOUS | Status: DC

## 2015-06-02 NOTE — Progress Notes (Signed)
Physical Therapy Treatment Patient Details Name: Amy Sweeney MRN: [id number] DOB: 1951-04-12 Today's Date: 06/02/2015    History of Present Illness Pt with considerable history of R knee issues, had TKA and is having a lot of pain     PT Comments    Pt is able to ambulate today and is actually able to do SLR and generally shows significant improvement.  She is able to get to EOB w/o direct assist and despite still having a lot of pain is able to participate with exercises better.  Pt still unsafe and limited, but does have a much better PT session today than any previous one.   Follow Up Recommendations  SNF     Equipment Recommendations       Recommendations for Other Services       Precautions / Restrictions Precautions Precautions: Fall Required Braces or Orthoses: Knee Immobilizer - Right Restrictions RLE Weight Bearing: Weight bearing as tolerated    Mobility  Bed Mobility Overal bed mobility: Modified Independent Bed Mobility: Supine to Sit     Supine to sit: Min guard     General bed mobility comments: Pt slow with getting to EOB, but able to get to sitting w/o direct physical assist  Transfers Overall transfer level: Needs assistance Equipment used: Rolling walker (2 wheeled) Transfers: Sit to/from Stand Sit to Stand: Mod assist;Min assist         General transfer comment: Pt needs a lot of cuing and encouragement, but ultimately does much, much better with getting to standing today.  Ambulation/Gait Ambulation/Gait assistance: Min assist Ambulation Distance (Feet): 7 Feet Assistive device: Rolling walker (2 wheeled)       General Gait Details: Pt actually able to appropriately take steps and bear weight to take steps.  She is very anxious and guarded, but with much cuing and encouragement it able to do some minimal ambulation.    Stairs            Wheelchair Mobility    Modified Rankin (Stroke Patients Only)       Balance                                     Cognition Arousal/Alertness: Awake/alert Behavior During Therapy: Restless Overall Cognitive Status: Within Functional Limits for tasks assessed                      Exercises Total Joint Exercises Ankle Circles/Pumps: AROM;10 reps Quad Sets: 10 reps;Strengthening;AROM Short Arc Quad: 10 reps;AAROM Heel Slides: 5 reps;AAROM Hip ABduction/ADduction: AAROM;10 reps Straight Leg Raises: AAROM;10 reps;AROM (pt does 5 w/o assist) Goniometric ROM: 2-75    General Comments        Pertinent Vitals/Pain Pain Assessment: 0-10 Pain Score: 10-Worst pain ever    Home Living                      Prior Function            PT Goals (current goals can now be found in the care plan section) Progress towards PT goals: Progressing toward goals    Frequency  BID    PT Plan Current plan remains appropriate    Co-evaluation             End of Session Equipment Utilized During Treatment: Gait belt Activity Tolerance: Patient limited by fatigue;Patient limited by pain Patient left:  with chair alarm set     Time: [phone] PT Time Calculation (min) (ACUTE ONLY): 41 min  Charges:  $Gait Training: 8-22 mins $Therapeutic Exercise: 23-37 mins                    G Codes:     Loran Senters, PT, DPT [phone]  Malachi Pro 06/02/2015, 11:24 AM

## 2015-06-02 NOTE — Discharge Instructions (Signed)

## 2015-06-02 NOTE — Progress Notes (Signed)
Pt medically cleared for dc to SNF. Pt and her husband selected [facility] for STR. CSW prepared dc packet and faxed dc summary to [facility]. CSW faxed DC summary to [facility] as well per [facility] request so that pt's meds could be available for her today.  Pt and husband pleased with dc plan. RN to call EMS for transport.   No further CSW needs at this time.   Wilford Grist, LCSW [phone]

## 2015-06-02 NOTE — Discharge Summary (Signed)
Physician Discharge Summary  Patient ID: Amy Sweeney MRN: [id number] DOB/AGE: [birth year] 64 y.o.  Admit date: 05/30/2015 Discharge date: 06/02/2015  Admission Diagnoses:  osteoarthritis   Discharge Diagnoses: Patient Active Problem List   Diagnosis Date Noted  . Traumatic arthritis of knee 05/30/2015    Past Medical History  Diagnosis Date  . Family history of adverse reaction to anesthesia     father blood clot after surgery died  . Bipolar disorder   . Diabetes mellitus without complication   . Anxiety   . Headache     history of migraines  . Coronary artery disease   . Arthritis     degenerative artgritis  osteoarthritis     Transfusion: none   Consultants (if any): Treatment Team:  Rosey Bath, MD Malachi Pro, PT  Discharged Condition: Improved  Hospital Course: Amy Sweeney is an 64 y.o. female who was admitted 05/30/2015 with a diagnosis of right knee OA and went to the operating room on 05/30/2015 and underwent the above named procedures.    Surgeries: Procedure(s): TOTAL KNEE ARTHROPLASTY on 05/30/2015 Patient tolerated the surgery well. Taken to PACU where she was stabilized and then transferred to the orthopedic floor.  Started on Lovenox 30 q 12 hrs. Foot pumps applied bilaterally at 80 mm. Heels elevated on bed with rolled towels. No evidence of DVT. Negative Homan. Physical therapy started on day #1 for gait training and transfer. OT started day #1 for ADL and assisted devices. Slow progress with PT.  Patient's foley was d/c on day #1. Patient's IV and hemovac was d/c on day #2.  On post op day #4 patient was stable and ready for discharge to rehab facility.  Implants: Medacta GMK sphere 4+ right femur, 5 tibia with 10 mm insert and 2 patella, all components cemented  She was given perioperative antibiotics:  Anti-infectives    Start     Dose/Rate Route Frequency Ordered Stop   05/30/15 1700  ceFAZolin (ANCEF) IVPB 2 g/50 mL  premix     2 g 100 mL/hr over 30 Minutes Intravenous Every 6 hours 05/30/15 1453 05/31/15 0519   05/30/15 0555  ceFAZolin (ANCEF) 2-3 GM-% IVPB SOLR    Comments:  Hallaji, Violet Ann: cabinet override      05/30/15 0555 05/30/15 1759   05/30/15 0230  ceFAZolin (ANCEF) IVPB 2 g/50 mL premix     2 g 100 mL/hr over 30 Minutes Intravenous  Once 05/30/15 0219 05/30/15 1100    .  She was given sequential compression devices, early ambulation, and lovenox for DVT prophylaxis.  She benefited maximally from the hospital stay and there were no complications.    Recent vital signs:  Filed Vitals:   06/02/15 0727  BP: 159/84  Pulse: 98  Temp: 98.8 F (37.1 C)  Resp: 16    Recent laboratory studies:  Lab Results  Component Value Date   HGB 12.0 06/01/2015   HGB 11.8* 05/31/2015   HGB 11.5* 05/30/2015   Lab Results  Component Value Date   WBC 9.9 06/01/2015   PLT 238 06/01/2015   Lab Results  Component Value Date   INR 1.09 05/15/2015   Lab Results  Component Value Date   NA 133* 06/01/2015   K 4.3 06/01/2015   CL 93* 06/01/2015   CO2 31 06/01/2015   BUN 8 06/01/2015   CREATININE 0.71 06/01/2015   GLUCOSE 240* 06/01/2015    Discharge Medications:     Medication List  TAKE these medications        acetaminophen 500 MG tablet  Commonly known as:  TYLENOL  Take 1,500 mg by mouth every 8 (eight) hours as needed.     baclofen 10 MG tablet  Commonly known as:  LIORESAL  Take 5 mg by mouth 2 (two) times daily. As needed for muscle spasms     CALCIUM CITRATE PO  Take 600 mg by mouth. 4 tablets taken every am     clonazePAM 1 MG tablet  Commonly known as:  KLONOPIN  Take 1 mg by mouth 3 (three) times daily as needed for anxiety.     diphenhydrAMINE 25 MG tablet  Commonly known as:  SOMINEX  Take 50 mg by mouth at bedtime.     divalproex 500 MG DR tablet  Commonly known as:  DEPAKOTE  Take 1,500 mg by mouth every morning. 3 tablets every am     DULoxetine  60 MG capsule  Commonly known as:  CYMBALTA  Take 60 mg by mouth daily. 2 capsules in the am     enoxaparin 30 MG/0.3ML injection  Commonly known as:  LOVENOX  Inject 0.3 mLs (30 mg total) into the skin every 12 (twelve) hours.     fentaNYL 50 MCG/HR  Commonly known as:  DURAGESIC - dosed mcg/hr  Place 50 mcg onto the skin every 3 (three) days.     fentaNYL 50 MCG/HR  Commonly known as:  DURAGESIC - dosed mcg/hr  Place 1 patch (50 mcg total) onto the skin every 3 (three) days.     ferrous sulfate 325 (65 FE) MG tablet  Take 325 mg by mouth daily with breakfast. 2 tablets in am     fluticasone 50 MCG/ACT nasal spray  Commonly known as:  FLONASE  Place 2 sprays into both nostrils every morning.     gabapentin 300 MG capsule  Commonly known as:  NEURONTIN  Take 1,200 mg by mouth every 6 (six) hours.     Ginkgo Biloba 60 MG Caps  Take 2 capsules by mouth every morning.     Magnesium Oxide -Mg Supplement 250 MG Tabs  Take 2 tablets by mouth every morning.     metFORMIN 1000 MG tablet  Commonly known as:  GLUCOPHAGE  Take 1,000 mg by mouth daily with breakfast. 2 capsules at breakfast     ONE-A-DAY WOMENS 50 PLUS PO  Take 2 tablets by mouth every morning.     Oxycodone HCl 10 MG Tabs  Take 10 mg by mouth every 8 (eight) hours as needed.     oxyCODONE 5 MG immediate release tablet  Commonly known as:  Oxy IR/ROXICODONE  Take 1-2 tablets (5-10 mg total) by mouth every 3 (three) hours as needed for breakthrough pain.     OxyCODONE 20 mg T12a 12 hr tablet  Commonly known as:  OXYCONTIN  Take 1 tablet (20 mg total) by mouth every 12 (twelve) hours.     traZODone 100 MG tablet  Commonly known as:  DESYREL  Take 100 mg by mouth at bedtime.     Vitamin B-12 2500 MCG Subl  Place 2 tablets under the tongue every morning.     Vitamin D3 5000 UNITS Caps  Take 5 capsules by mouth. 5 capsules every am     vitamin E 1000 UNIT capsule  Take 1,000 Units by mouth daily. 2  capsules in am     Zinc 50 MG Tabs  Take 100 mg by mouth.  In am        Diagnostic Studies: Dg Knee 1-2 Views Right  05/30/2015   CLINICAL DATA:  Postop right knee replacement  EXAM: RIGHT KNEE - 1-2 VIEW  COMPARISON:  01/14/2014  FINDINGS: Tricompartmental knee prosthesis with components in anticipated position. Anticipated postoperative change over the joint including air and fluid in the joint. Small joint effusion. Drainage catheter projects over the joint as well.  IMPRESSION: Anticipated postoperative appearance   Electronically Signed   By: Esperanza Heir M.D.   On: 05/30/2015 14:04    Disposition: Rehab facility       Follow-up Information    Follow up with MENZ,MICHAEL, MD In 2 weeks.   Specialty:  Orthopedic Surgery   Why:  For staple removal and skin check   Contact information:   [address] [place] [address] [phone]        Signed: Patience Musca 06/02/2015, 9:28 AM

## 2015-06-02 NOTE — Progress Notes (Signed)
Report called to [facility]. Pt ready for d/c, husband at bedside. Will transport via ems.

## 2015-06-02 NOTE — Clinical Social Work Placement (Signed)
   CLINICAL SOCIAL WORK PLACEMENT  NOTE  Date:  06/02/2015  Patient Details  Name: Amy Sweeney MRN: [id number] Date of Birth: Jul 03, 1951  Clinical Social Work is seeking post-discharge placement for this patient at the Skilled  Nursing Facility level of care (*CSW will initial, date and re-position this form in  chart as items are completed):  Yes   Patient/family provided with [place] Clinical Social Work Department's list of facilities offering this level of care within the geographic area requested by the patient (or if unable, by the patient's family).  Yes   Patient/family informed of their freedom to choose among providers that offer the needed level of care, that participate in Medicare, Medicaid or managed care program needed by the patient, have an available bed and are willing to accept the patient.  Yes   Patient/family informed of [facility]'s ownership interest in [facility] and [facility], as well as of the fact that they are under no obligation to receive care at these facilities.  PASRR submitted to EDS on       PASRR number received on       Existing PASRR number confirmed on 05/31/15     FL2 transmitted to all facilities in geographic area requested by pt/family on 05/31/15     FL2 transmitted to all facilities within larger geographic area on       Patient informed that his/her managed care company has contracts with or will negotiate with certain facilities, including the following:        Yes   Patient/family informed of bed offers received.  Patient chooses bed at  [facility])     Physician recommends and patient chooses bed at      Patient to be transferred to  [facility]) on 06/02/15.  Patient to be transferred to facility by EMS [facility])     Patient family notified on 06/02/15 of transfer.  Name of family member notified:  Paul/husband     PHYSICIAN       Additional Comment:     _______________________________________________ Ned Card, LCSW 06/02/2015, 2:23 PM

## 2015-06-02 NOTE — Progress Notes (Signed)
   Subjective: 3 Days Post-Op Procedure(s) (LRB): TOTAL KNEE ARTHROPLASTY (Right) Patient reports pain as moderate Patient is complaining of pain. No pain meds since last night. No CP/SOB/N/V We will continue with physical therapy. Slow progress with PT. Tolerating po well.   Objective: Vital signs in last 24 hours: Temp:  [98.8 F (37.1 C)-99.5 F (37.5 C)] 98.8 F (37.1 C) (09/18 0727) Pulse Rate:  [98-116] 98 (09/18 0727) Resp:  [16-18] 16 (09/18 0727) BP: (124-159)/(78-91) 159/84 mmHg (09/18 0727) SpO2:  [93 %-97 %] 95 % (09/18 0727)  Intake/Output from previous day: 09/17 0701 - 09/18 0700 In: 0  Out: 900 [Urine:900] Intake/Output this shift:     Recent Labs  05/30/15 1542 05/31/15 0600 06/01/15 0329  HGB 11.5* 11.8* 12.0    Recent Labs  05/31/15 0600 06/01/15 0329  WBC 8.4 9.9  RBC 3.68* 3.64*  HCT 35.1 35.1  PLT 230 238    Recent Labs  05/31/15 0600 06/01/15 0329  NA 131* 133*  K 4.6 4.3  CL 95* 93*  CO2 27 31  BUN 10 8  CREATININE 0.63 0.71  GLUCOSE 217* 240*  CALCIUM 8.6* 9.3   No results for input(s): LABPT, INR in the last 72 hours.  EXAM General - Patient is Alert, Appropriate and Oriented Extremity - Neurovascular intact Sensation intact distally Intact pulses distally Dorsiflexion/Plantar flexion intact Dressing - dressing C/D/I, scant drainage Motor Function - intact, moving foot and toes well on exam.   Past Medical History  Diagnosis Date  . Family history of adverse reaction to anesthesia     father blood clot after surgery died  . Bipolar disorder   . Diabetes mellitus without complication   . Anxiety   . Headache     history of migraines  . Coronary artery disease   . Arthritis     degenerative artgritis  osteoarthritis    Assessment/Plan:   3 Days Post-Op Procedure(s) (LRB): TOTAL KNEE ARTHROPLASTY (Right) Active Problems:   Traumatic arthritis of knee   Hyponatremia  Estimated body mass index is 38.48  kg/(m^2) as calculated from the following:   Height as of this encounter:  (1.727 m).   Weight as of this encounter: 114.76 kg (253 lb). Advance diet Up with therapy  Discharge to rehab today Follow up with [facility] in 2 weeks   DVT Prophylaxis - Lovenox, Foot Pumps and TED hose Weight-Bearing as tolerated to right leg D/C O2 and Pulse OX and try on Room Air  T. Cranston Neighbor, PA-C [facility] 06/02/2015, 8:06 AM

## 2015-06-02 NOTE — Clinical Social Work Placement (Signed)
   CLINICAL SOCIAL WORK PLACEMENT  NOTE  Date:  06/02/2015  Patient Details  Name: Amy Sweeney MRN: [id number] Date of Birth: [birth year]  Clinical Social Work is seeking post-discharge placement for this patient at the Skilled  Nursing Facility level of care (*CSW will initial, date and re-position this form in  chart as items are completed):  Yes   Patient/family provided with [place] Clinical Social Work Department's list of facilities offering this level of care within the geographic area requested by the patient (or if unable, by the patient's family).  Yes   Patient/family informed of their freedom to choose among providers that offer the needed level of care, that participate in Medicare, Medicaid or managed care program needed by the patient, have an available bed and are willing to accept the patient.  Yes   Patient/family informed of [facility]'s ownership interest in [facility] and [facility], as well as of the fact that they are under no obligation to receive care at these facilities.  PASRR submitted to EDS on       PASRR number received on       Existing PASRR number confirmed on 05/31/15     FL2 transmitted to all facilities in geographic area requested by pt/family on 05/31/15     FL2 transmitted to all facilities within larger geographic area on       Patient informed that his/her managed care company has contracts with or will negotiate with certain facilities, including the following:        Yes   Patient/family informed of bed offers received.  Patient chooses bed at       Physician recommends and patient chooses bed at      Patient to be transferred to   on  .  Patient to be transferred to facility by  [facility])     Patient family notified on   of transfer.  Name of family member notified:        PHYSICIAN       Additional Comment:    _______________________________________________ Ned Card,  LCSW 06/02/2015, 6:44 AM

## 2015-06-02 NOTE — Progress Notes (Signed)
Patient is all set for discharge.  No discharge order.  Cranston Neighbor PA gave telephone consent for discharge

## 2015-06-03 ENCOUNTER — Encounter: Payer: Self-pay | Admitting: Orthopedic Surgery

## 2015-06-03 LAB — SURGICAL PATHOLOGY

## 2015-06-06 NOTE — Anesthesia Postprocedure Evaluation (Signed)
  Anesthesia Post-op Note  Patient: Amy Sweeney  Procedure(s) Performed: Procedure(s): TOTAL KNEE ARTHROPLASTY (Right)  Anesthesia type:Spinal, MAC  Patient location: PACU  Post pain: Pain level controlled  Post assessment: Post-op Vital signs reviewed, Patient's Cardiovascular Status Stable, Respiratory Function Stable, Patent Airway and No signs of Nausea or vomiting  Post vital signs: Reviewed and stable  Last Vitals:  Filed Vitals:   06/02/15 1356  BP: 121/78  Pulse: 113  Temp: 37.7 C  Resp: 16    Level of consciousness: awake, alert  and patient cooperative  Complications: No apparent anesthesia complications

## 2015-07-22 ENCOUNTER — Other Ambulatory Visit: Payer: Self-pay | Admitting: Vascular Surgery

## 2015-07-23 ENCOUNTER — Ambulatory Visit
Admission: RE | Admit: 2015-07-23 | Discharge: 2015-07-23 | Disposition: A | Discharge status: Home / Self Care | Payer: Medicare Other | Source: Ambulatory Visit | Attending: Vascular Surgery | Admitting: Vascular Surgery

## 2015-07-23 ENCOUNTER — Encounter
Admission: RE | Disposition: A | Discharge status: Home / Self Care | Payer: Self-pay | Source: Ambulatory Visit | Attending: Vascular Surgery

## 2015-07-23 ENCOUNTER — Encounter: Payer: Self-pay | Admitting: *Deleted

## 2015-07-23 DIAGNOSIS — I824Z9 Acute embolism and thrombosis of unspecified deep veins of unspecified distal lower extremity: Secondary | ICD-10-CM | POA: Diagnosis not present

## 2015-07-23 DIAGNOSIS — Z452 Encounter for adjustment and management of vascular access device: Secondary | ICD-10-CM | POA: Insufficient documentation

## 2015-07-23 DIAGNOSIS — Z7901 Long term (current) use of anticoagulants: Secondary | ICD-10-CM | POA: Diagnosis not present

## 2015-07-23 DIAGNOSIS — Z6838 Body mass index (BMI) 38.0-38.9, adult: Secondary | ICD-10-CM | POA: Insufficient documentation

## 2015-07-23 DIAGNOSIS — Z7984 Long term (current) use of oral hypoglycemic drugs: Secondary | ICD-10-CM | POA: Insufficient documentation

## 2015-07-23 DIAGNOSIS — E1122 Type 2 diabetes mellitus with diabetic chronic kidney disease: Secondary | ICD-10-CM | POA: Insufficient documentation

## 2015-07-23 DIAGNOSIS — I872 Venous insufficiency (chronic) (peripheral): Secondary | ICD-10-CM | POA: Diagnosis not present

## 2015-07-23 DIAGNOSIS — Z86711 Personal history of pulmonary embolism: Secondary | ICD-10-CM | POA: Diagnosis not present

## 2015-07-23 DIAGNOSIS — Z79899 Other long term (current) drug therapy: Secondary | ICD-10-CM | POA: Insufficient documentation

## 2015-07-23 DIAGNOSIS — I890 Lymphedema, not elsewhere classified: Secondary | ICD-10-CM | POA: Insufficient documentation

## 2015-07-23 HISTORY — PX: PERIPHERAL VASCULAR CATHETERIZATION: SHX172C

## 2015-07-23 LAB — GLUCOSE, CAPILLARY
GLUCOSE-CAPILLARY: 98 mg/dL (ref 65–99)
Glucose-Capillary: 94 mg/dL (ref 65–99)

## 2015-07-23 SURGERY — IVC FILTER REMOVAL
Anesthesia: Moderate Sedation

## 2015-07-23 MED ORDER — FENTANYL CITRATE (PF) 100 MCG/2ML IJ SOLN
INTRAMUSCULAR | Status: DC | PRN
  Administered 2015-07-23 (×3): 50 ug via INTRAVENOUS
  Administered 2015-07-23: 100 ug via INTRAVENOUS

## 2015-07-23 MED ORDER — SODIUM CHLORIDE 0.9 % IV SOLN: INTRAVENOUS | Status: DC

## 2015-07-23 MED ORDER — HEPARIN (PORCINE) IN NACL 2-0.9 UNIT/ML-% IJ SOLN
INTRAMUSCULAR | Status: AC
  Filled 2015-07-23: qty 500

## 2015-07-23 MED ORDER — MIDAZOLAM HCL 2 MG/2ML IJ SOLN
INTRAMUSCULAR | Status: AC
  Filled 2015-07-23: qty 4

## 2015-07-23 MED ORDER — FENTANYL CITRATE (PF) 100 MCG/2ML IJ SOLN
INTRAMUSCULAR | Status: AC
  Filled 2015-07-23: qty 2

## 2015-07-23 MED ORDER — MIDAZOLAM HCL 2 MG/2ML IJ SOLN
INTRAMUSCULAR | Status: AC
  Filled 2015-07-23: qty 2

## 2015-07-23 MED ORDER — IOHEXOL 300 MG/ML  SOLN
INTRAMUSCULAR | Status: DC | PRN
  Administered 2015-07-23: 15 mL via INTRAVENOUS

## 2015-07-23 MED ORDER — MIDAZOLAM HCL 2 MG/2ML IJ SOLN
INTRAMUSCULAR | Status: DC | PRN
  Administered 2015-07-23: 1 mg via INTRAVENOUS
  Administered 2015-07-23: 2 mg via INTRAVENOUS
  Administered 2015-07-23 (×2): 1 mg via INTRAVENOUS

## 2015-07-23 MED ORDER — INSULIN ASPART 100 UNIT/ML ~~LOC~~ SOLN: 0.0000 [IU] | SUBCUTANEOUS | Status: DC

## 2015-07-23 MED ORDER — LIDOCAINE-EPINEPHRINE (PF) 1 %-1:200000 IJ SOLN
INTRAMUSCULAR | Status: AC
  Filled 2015-07-23: qty 30

## 2015-07-23 SURGICAL SUPPLY — 5 items
CANNULA 5F STIFF (CANNULA) ×2 IMPLANT
PACK ANGIOGRAPHY (CUSTOM PROCEDURE TRAY) ×2 IMPLANT
SET VENACAVA FILTER RETRIEVAL (MISCELLANEOUS) ×2 IMPLANT
TOWEL OR 17X26 4PK STRL BLUE (TOWEL DISPOSABLE) ×2 IMPLANT
WIRE J 3MM .035X145CM (WIRE) ×2 IMPLANT

## 2015-07-23 NOTE — Discharge Instructions (Signed)
The drugs you were given will stay in your system until tomorrow, so for the next 24 hours you should not.  Drive an automobile. Make any legal decisions.  Drink any alcoholic beverages.  Today you should start with liquids and gradually work up to solid foods as your are able to tolerate them  Resume your regular medications as prescribed by your doctor.  Avoid aspirin for 24 hours.    Change the Band-Aid or dressing as needed.  After one day no dressing is needed.  Avoid strenuous activity for the remainder of the day.  Please notify your primary physician immediately if you have any unusual bleeding, trouble breathing, fever >100 degrees or pain not relieved by the medication your doctor prescribed for your doctor prescribed for you physician  Return to diaslysis  tommorow.

## 2015-07-23 NOTE — Op Note (Signed)
  OPERATIVE NOTE   PRE-OPERATIVE DIAGNOSIS: DVT with history of PE  POST-OPERATIVE DIAGNOSIS: Same  PROCEDURE: 1. Retrieval of IVC Filter 2. Inferior Vena Cavagram  SURGEON: Renford DillsGregory G. Tacia Hindley, M.D.  ANESTHESIA:  Conscious Sedation  ESTIMATED BLOOD LOSS: Minimal cc  FINDING(S):inferior vena cava is widely patent filter is in place in good position. Filter is removed without incident  SPECIMEN(S):  IVC filter intact  INDICATIONS:   Amy GumRebecca G Lapka is a 64 y.o. female who presents with history of IVC filter placement. She has done well and no longer needs her filter. It is recommended that the filter be removed the risks and benefits of been reviewed the patient agrees  DESCRIPTION: After obtaining full informed written consent, the patient was brought back to the Special Procedure Suite and placed in the supine position.  The patient received IV antibiotics prior to induction.  After obtaining adequate sedation, the patient was prepped and draped in the standard fashion and appropriate time out is called.     Ultrasound was placed in a sterile sleeve.The right neck was then imaged with ultrasound.   Jugular vein was identified it is echolucent and homogeneous indicating patency. 1% lidocaine is then infiltrated under ultrasound visualization and subsequently a Seldinger needle is inserted under real-time ultrasound guidance.  J-wire is then advanced into the inferior vena cava under fluoroscopic guidance. With the tip of the sheath at the confluence of the iliac veins inferior vena caval imaging is performed.  After review of the image the sheath is repositioned to above the filter and the snares introduced. Snares opened and the hook is secured without difficulty. The filter is then collapsed within the sheath and removed without difficulty.  Sheath is removed by pressures held the patient tolerated the procedure well and there were no immediate complications.  Interpretation:  inferior vena cava is widely patent filter is in place in good position. Filter is removed without incident.     COMPLICATIONS: None  CONDITION: Almon RegisterGood  Breaunna Gottlieb G. Malaki Koury, M.D. [facility] Office: [phone]   07/23/2015, 5:45 PM

## 2015-07-23 NOTE — H&P (Signed)
[facility] History & Physical Update  The patient was interviewed and re-examined.  The patient's previous History and Physical has been reviewed and is unchanged.  There is no change in the plan of care. We plan to proceed with the scheduled procedure.  Amy Sweeney, Latina CraverGregory G, MD  07/23/2015, 5:45 PM

## 2015-07-31 ENCOUNTER — Encounter: Payer: Self-pay | Admitting: Vascular Surgery

## 2015-11-12 ENCOUNTER — Other Ambulatory Visit: Payer: Self-pay | Admitting: Orthopedic Surgery

## 2015-11-12 DIAGNOSIS — M545 Low back pain: Secondary | ICD-10-CM

## 2015-11-23 ENCOUNTER — Ambulatory Visit
Admission: RE | Admit: 2015-11-23 | Discharge: 2015-11-23 | Disposition: A | Discharge status: Home / Self Care | Payer: Medicare Other | Source: Ambulatory Visit | Attending: Orthopedic Surgery | Admitting: Orthopedic Surgery

## 2015-11-23 DIAGNOSIS — M545 Low back pain: Secondary | ICD-10-CM

## 2016-03-11 ENCOUNTER — Emergency Department: Payer: Medicare Other

## 2016-03-11 ENCOUNTER — Emergency Department
Admission: EM | Admit: 2016-03-11 | Discharge: 2016-03-11 | Disposition: A | Discharge status: Home / Self Care | Payer: Medicare Other | Attending: Emergency Medicine | Admitting: Emergency Medicine

## 2016-03-11 ENCOUNTER — Encounter: Payer: Self-pay | Admitting: Emergency Medicine

## 2016-03-11 DIAGNOSIS — E119 Type 2 diabetes mellitus without complications: Secondary | ICD-10-CM | POA: Insufficient documentation

## 2016-03-11 DIAGNOSIS — Z7951 Long term (current) use of inhaled steroids: Secondary | ICD-10-CM | POA: Diagnosis not present

## 2016-03-11 DIAGNOSIS — Z791 Long term (current) use of non-steroidal anti-inflammatories (NSAID): Secondary | ICD-10-CM | POA: Diagnosis not present

## 2016-03-11 DIAGNOSIS — Z79899 Other long term (current) drug therapy: Secondary | ICD-10-CM | POA: Insufficient documentation

## 2016-03-11 DIAGNOSIS — Y9389 Activity, other specified: Secondary | ICD-10-CM | POA: Diagnosis not present

## 2016-03-11 DIAGNOSIS — X501XXA Overexertion from prolonged static or awkward postures, initial encounter: Secondary | ICD-10-CM | POA: Insufficient documentation

## 2016-03-11 DIAGNOSIS — Z7901 Long term (current) use of anticoagulants: Secondary | ICD-10-CM | POA: Insufficient documentation

## 2016-03-11 DIAGNOSIS — I2510 Atherosclerotic heart disease of native coronary artery without angina pectoris: Secondary | ICD-10-CM | POA: Insufficient documentation

## 2016-03-11 DIAGNOSIS — M1990 Unspecified osteoarthritis, unspecified site: Secondary | ICD-10-CM | POA: Insufficient documentation

## 2016-03-11 DIAGNOSIS — Z79891 Long term (current) use of opiate analgesic: Secondary | ICD-10-CM | POA: Diagnosis not present

## 2016-03-11 DIAGNOSIS — S99911A Unspecified injury of right ankle, initial encounter: Secondary | ICD-10-CM | POA: Diagnosis present

## 2016-03-11 DIAGNOSIS — F319 Bipolar disorder, unspecified: Secondary | ICD-10-CM | POA: Insufficient documentation

## 2016-03-11 DIAGNOSIS — S93401A Sprain of unspecified ligament of right ankle, initial encounter: Secondary | ICD-10-CM | POA: Insufficient documentation

## 2016-03-11 DIAGNOSIS — Y999 Unspecified external cause status: Secondary | ICD-10-CM | POA: Diagnosis not present

## 2016-03-11 DIAGNOSIS — Z7984 Long term (current) use of oral hypoglycemic drugs: Secondary | ICD-10-CM | POA: Insufficient documentation

## 2016-03-11 DIAGNOSIS — M25569 Pain in unspecified knee: Secondary | ICD-10-CM

## 2016-03-11 DIAGNOSIS — Y929 Unspecified place or not applicable: Secondary | ICD-10-CM | POA: Diagnosis not present

## 2016-03-11 MED ORDER — HYDROMORPHONE HCL 1 MG/ML IJ SOLN
1.0000 mg | Freq: Once | INTRAMUSCULAR | Status: AC
  Administered 2016-03-11: 1 mg via INTRAMUSCULAR
  Filled 2016-03-11: qty 1

## 2016-03-11 MED ORDER — TRAZODONE HCL 100 MG PO TABS: 100.0000 mg | ORAL_TABLET | Freq: Every day | ORAL | Status: AC

## 2016-03-11 MED ORDER — MELATONIN 1 MG PO TABS: 1.0000 mg | ORAL_TABLET | Freq: Every evening | ORAL | Status: DC | PRN

## 2016-03-11 NOTE — ED Notes (Addendum)
Pt reports tripping going up steps yesterday, reports right knee, ankle and foot pain.

## 2016-03-11 NOTE — ED Provider Notes (Signed)
[facility] Emergency Department Provider Note  ____________________________________________  Time seen: Approximately 6:39 PM  I have reviewed the triage vital signs and the nursing notes.   HISTORY  Chief Complaint Ankle Pain    HPI Amy Sweeney is a 65 y.o. female who presents emergency department complaining of right foot, ankle, and knee pain. Patient states that she was going up a flight of stairs when she twisted her ankle. Patient reports having significant osteoporosis and osteoarthritis for which she is on chronic pain medications. She has a knee replacement to the affected knee. Patient denies landing on the plantar reports tenderness swollen and "discolored." Patient reports significant pain and states that her chronic pain medications a fentanyl patch, oxycodone tablets, and gabapentin are not covering this pain. Patient denies hitting her head or losing consciousness. She denies any other injury or complaint at this time.   Past Medical History  Diagnosis Date  . Family history of adverse reaction to anesthesia     father blood clot after surgery died  . Bipolar disorder (HCC)   . Diabetes mellitus without complication (HCC)   . Anxiety   . Headache     history of migraines  . Coronary artery disease   . Arthritis     degenerative artgritis  osteoarthritis    Patient Active Problem List   Diagnosis Date Noted  . Traumatic arthritis of knee 05/30/2015    Past Surgical History  Procedure Laterality Date  . Cesarean section      x2  . Dilation and curettage of uterus    . Neck surgery      removed the buldge of the disc and hardware.  . Laminectomy  2014  . Gastric bypass  2004  . Knee arthroscopy Left 2004 & 2006  . Cholecystectomy    . Cardiac catheterization  2004  . Peripheral vascular catheterization N/A 05/21/2015    Procedure: IVC Filter Insertion;  Surgeon: Renford Dills, MD;  Location: [facility] INVASIVE CV LAB;   Service: Cardiovascular;  Laterality: N/A;  . Total knee arthroplasty Right 05/30/2015    Procedure: TOTAL KNEE ARTHROPLASTY;  Surgeon: Kennedy Bucker, MD;  Location: [facility] ORS;  Service: Orthopedics;  Laterality: Right;  . Peripheral vascular catheterization N/A 07/23/2015    Procedure: IVC Filter Removal;  Surgeon: Renford Dills, MD;  Location: [facility] INVASIVE CV LAB;  Service: Cardiovascular;  Laterality: N/A;    Current Outpatient Rx  Name  Route  Sig  Dispense  Refill  . acetaminophen (TYLENOL) 500 MG tablet   Oral   Take 1,500 mg by mouth every 8 (eight) hours as needed.         Marland Kitchen apixaban (ELIQUIS) 5 MG TABS tablet   Oral   Take 5 mg by mouth 2 (two) times daily.         . baclofen (LIORESAL) 10 MG tablet   Oral   Take 5 mg by mouth 2 (two) times daily. As needed for muscle spasms         . CALCIUM CITRATE PO   Oral   Take 600 mg by mouth. 4 tablets taken every am         . Cholecalciferol (VITAMIN D3) 5000 UNITS CAPS   Oral   Take 5 capsules by mouth. 5 capsules every am         . clonazePAM (KLONOPIN) 1 MG tablet   Oral   Take 1 mg by mouth 3 (three) times daily as needed  for anxiety.          . Cyanocobalamin (VITAMIN B-12) 2500 MCG SUBL   Sublingual   Place 2 tablets under the tongue every morning.         . diphenhydrAMINE (SOMINEX) 25 MG tablet   Oral   Take 50 mg by mouth at bedtime.         . divalproex (DEPAKOTE) 500 MG DR tablet   Oral   Take 1,500 mg by mouth every morning. 3 tablets every am         . DULoxetine (CYMBALTA) 60 MG capsule   Oral   Take 60 mg by mouth daily. 2 capsules in the am         . enoxaparin (LOVENOX) 30 MG/0.3ML injection   Subcutaneous   Inject 0.3 mLs (30 mg total) into the skin every 12 (twelve) hours. Patient not taking: Reported on 07/23/2015   14 Syringe   0   . fentaNYL (DURAGESIC - DOSED MCG/HR) 50 MCG/HR   Transdermal   Place 50 mcg onto the skin every 3 (three) days.         . fentaNYL  (DURAGESIC - DOSED MCG/HR) 50 MCG/HR   Transdermal   Place 1 patch (50 mcg total) onto the skin every 3 (three) days. Patient taking differently: Place 75 mcg onto the skin every 3 (three) days.    5 patch   0   . ferrous sulfate 325 (65 FE) MG tablet   Oral   Take 325 mg by mouth daily with breakfast. 2 tablets in am         . fluticasone (FLONASE) 50 MCG/ACT nasal spray   Each Nare   Place 2 sprays into both nostrils every morning.         . gabapentin (NEURONTIN) 300 MG capsule   Oral   Take 1,200 mg by mouth every 6 (six) hours.          . Ginkgo Biloba 60 MG CAPS   Oral   Take 2 capsules by mouth every morning.         . Magnesium Oxide -Mg Supplement 250 MG TABS   Oral   Take 2 tablets by mouth every morning.         . Melatonin 1 MG TABS   Oral   Take 1 tablet (1 mg total) by mouth at bedtime as needed.   5 tablet   0   . metFORMIN (GLUCOPHAGE) 1000 MG tablet   Oral   Take 1,000 mg by mouth daily with breakfast. 2 capsules at breakfast         . Multiple Vitamins-Minerals (ONE-A-DAY WOMENS 50 PLUS PO)   Oral   Take 2 tablets by mouth every morning.         Marland Kitchen. oxyCODONE (OXY IR/ROXICODONE) 5 MG immediate release tablet   Oral   Take 1-2 tablets (5-10 mg total) by mouth every 3 (three) hours as needed for breakthrough pain.   30 tablet   0   . OxyCODONE (OXYCONTIN) 20 mg T12A 12 hr tablet   Oral   Take 1 tablet (20 mg total) by mouth every 12 (twelve) hours. Patient not taking: Reported on 07/23/2015   60 tablet   0   . Oxycodone HCl 10 MG TABS   Oral   Take 10 mg by mouth every 8 (eight) hours as needed.         . traZODone (DESYREL) 100 MG tablet  Oral   Take 100 mg by mouth at bedtime.         . traZODone (DESYREL) 100 MG tablet   Oral   Take 1 tablet (100 mg total) by mouth at bedtime.   5 tablet   0   . vitamin E 1000 UNIT capsule   Oral   Take 1,000 Units by mouth daily. 2 capsules in am         . Zinc 50 MG TABS    Oral   Take 100 mg by mouth. In am           Allergies Bee venom; Morphine and related; Adhesive; Celebrex; Erythromycin; Tegaderm ag mesh; and Levaquin  No family history on file.  Social History Social History  Substance Use Topics  . Smoking status: Never Smoker   . Smokeless tobacco: None  . Alcohol Use: No     Review of Systems  Constitutional: No fever/chills Eyes: No visual changes.  Cardiovascular: no chest pain. Respiratory: no cough. No SOB. Gastrointestinal: No abdominal pain.  No nausea, no vomiting.  Musculoskeletal: Positive for right foot, right ankle, right knee pain Skin: Negative for rash, abrasions, lacerations, ecchymosis. Neurological: Negative for headaches, focal weakness or numbness. 10-point ROS otherwise negative.  ____________________________________________   PHYSICAL EXAM:  VITAL SIGNS: ED Triage Vitals  Enc Vitals Group     BP 03/11/16 1835 124/74 mmHg     Pulse Rate 03/11/16 1835 97     Resp 03/11/16 1835 20     Temp 03/11/16 1835 98 F (36.7 C)     Temp src --      SpO2 03/11/16 1835 95 %     Weight 03/11/16 1835 245 lb (111.131 kg)     Height 03/11/16 1835 5\' 8"  (1.727 m)     Head Cir --      Peak Flow --      Pain Score --      Pain Loc --      Pain Edu? --      Excl. in GC? --      Constitutional: Alert and oriented. Well appearing and in no acute distress. Eyes: Conjunctivae are normal. PERRL. EOMI. Head: Atraumatic Cardiovascular: Normal rate, regular rhythm. Normal S1 and S2.  Good peripheral circulation. Respiratory: Normal respiratory effort without tachypnea or retractions. Lungs CTAB. Good air entry to the bases with no decreased or absent breath sounds. Musculoskeletal: No visible deformity to right knee, ankle, or foot upon inspection. Ankle and knee are edematous to inspection. Patient has limited range of motion in the foot flexion and extension planes due to pain. There is, valgus, Lachman's is negative.  Patient is diffusely tender to palpation over the anterior aspect of the knee along the joint line and tibia plateau region. No palpable abnormality. Limited range of motion in all planes of right ankle. Patient is tender to palpation over the anterior, lateral, and medial aspects of the ankle. No palpable abnormality. Patient is tender to palpation over the tarsal bones with no palpable abnormality. Dorsalis pedis pulse and sensation intact 5 digits. Neurologic:  Normal speech and language. No gross focal neurologic deficits are appreciated.  Skin:  Skin is warm, dry and intact. No rash noted. Psychiatric: Mood and affect are normal. Speech and behavior are normal. Patient exhibits appropriate insight and judgement.   ____________________________________________   LABS (all labs ordered are listed, but only abnormal results are displayed)  Labs Reviewed - No data to display ____________________________________________  EKG  ____________________________________________  RADIOLOGY Festus Barren Loreen Bankson, personally viewed and evaluated these images (plain radiographs) as part of my medical decision making, as well as reviewing the written report by the radiologist.  Dg Ankle Complete Right  03/11/2016  CLINICAL DATA:  Knee, ankle and foot pain after falling up stairs yesterday. History of osteoporosis and osteoarthritis. EXAM: RIGHT ANKLE - COMPLETE 3+ VIEW COMPARISON:  08/05/2011 radiographs. FINDINGS: Progressive severe osseous demineralization. No evidence of acute fracture or dislocation. There are stable mild tibiotalar degenerative changes. The the soft tissue surrounding the ankle are diffusely prominent without apparent focal swelling. IMPRESSION: Progressive osteopenia and generalized soft tissue prominence around the ankle. No acute osseous findings. Electronically Signed   By: Carey Bullocks M.D.   On: 03/11/2016 19:29   Dg Knee Complete 4 Views Right  03/11/2016  CLINICAL  DATA:  Tripped going up steps yesterday with right knee pain. EXAM: RIGHT KNEE - COMPLETE 4+ VIEW COMPARISON:  None. FINDINGS: Three views study shows no fracture. No subluxation or dislocation although positioning on the frontal projection and oblique projection is limited. No evidence for joint effusion. Bones are diffusely demineralized. IMPRESSION: Negative. Electronically Signed   By: Kennith Center M.D.   On: 03/11/2016 19:27   Dg Foot Complete Right  03/11/2016  CLINICAL DATA:  Knee, ankle and foot pain after falling up stairs yesterday. History of osteoporosis and osteoarthritis. EXAM: RIGHT FOOT COMPLETE - 3+ VIEW COMPARISON:  None. FINDINGS: The bones are diffusely demineralized. No evidence of acute fracture or dislocation. The alignment is normal at the Lisfranc joint. There are mild first MTP degenerative changes. The soft tissues throughout the foot are prominent without focal swelling or foreign body. IMPRESSION: Osteopenia and generalized soft tissue prominence. No acute osseous findings. Electronically Signed   By: Carey Bullocks M.D.   On: 03/11/2016 19:30    ____________________________________________    PROCEDURES  Procedure(s) performed:       Medications  HYDROmorphone (DILAUDID) injection 1 mg (1 mg Intramuscular Given 03/11/16 2005)     ____________________________________________   INITIAL IMPRESSION / ASSESSMENT AND PLAN / ED COURSE  Pertinent labs & imaging results that were available during my care of the patient were reviewed by me and considered in my medical decision making (see chart for details).  Patient's diagnosis is consistent with right ankle sprain. X-rays reveal no acute osseous abnormality. Exam is reassuring. Patient is on chronic pain management with postcoital patches, oral oxycodone, and gabapentin. Patient reports that recent injury has increased her pain level that is not being covered by regularly prescribed medications. Patient is given  1 mg of Dilaudid IM for pain relief here in the emergency department. Patient will be discharged home with prescriptions for limited prescription for trazodone and melatonin for sleep related purposes. Patient states that she has been on these the past but was not having sleep issues until her injury and doing with her pain. Patient was given limited prescriptions for these medications.. Patient is to follow up with primary care provider as needed or otherwise directed. Patient is given ED precautions to return to the ED for any worsening or new symptoms.     ____________________________________________  FINAL CLINICAL IMPRESSION(S) / ED DIAGNOSES  Final diagnoses:  Ankle sprain, right, initial encounter      NEW MEDICATIONS STARTED DURING THIS VISIT:  New Prescriptions   MELATONIN 1 MG TABS    Take 1 tablet (1 mg total) by mouth at bedtime as needed.   TRAZODONE (DESYREL) 100  MG TABLET    Take 1 tablet (100 mg total) by mouth at bedtime.        This chart was dictated using voice recognition software/Dragon. Despite best efforts to proofread, errors can occur which can change the meaning. Any change was purely unintentional.    Racheal Patches, PA-C 03/11/16 2030  Governor Rooks, MD 03/11/16 2206

## 2016-03-11 NOTE — Discharge Instructions (Signed)
Ankle Sprain °An ankle sprain is an injury to the strong, fibrous tissues (ligaments) that hold the bones of your ankle joint together.  °CAUSES °An ankle sprain is usually caused by a fall or by twisting your ankle. Ankle sprains most commonly occur when you step on the outer edge of your foot, and your ankle turns inward. People who participate in sports are more prone to these types of injuries.  °SYMPTOMS  °· Pain in your ankle. The pain may be present at rest or only when you are trying to stand or walk. °· Swelling. °· Bruising. Bruising may develop immediately or within 1 to 2 days after your injury. °· Difficulty standing or walking, particularly when turning corners or changing directions. °DIAGNOSIS  °Your caregiver will ask you details about your injury and perform a physical exam of your ankle to determine if you have an ankle sprain. During the physical exam, your caregiver will press on and apply pressure to specific areas of your foot and ankle. Your caregiver will try to move your ankle in certain ways. An X-ray exam may be done to be sure a bone was not broken or a ligament did not separate from one of the bones in your ankle (avulsion fracture).  °TREATMENT  °Certain types of braces can help stabilize your ankle. Your caregiver can make a recommendation for this. Your caregiver may recommend the use of medicine for pain. If your sprain is severe, your caregiver may refer you to a surgeon who helps to restore function to parts of your skeletal system (orthopedist) or a physical therapist. °HOME CARE INSTRUCTIONS  °· Apply ice to your injury for 1-2 days or as directed by your caregiver. Applying ice helps to reduce inflammation and pain. °· Put ice in a plastic bag. °· Place a towel between your skin and the bag. °· Leave the ice on for 15-20 minutes at a time, every 2 hours while you are awake. °· Only take over-the-counter or prescription medicines for pain, discomfort, or fever as directed by  your caregiver. °· Elevate your injured ankle above the level of your heart as much as possible for 2-3 days. °· If your caregiver recommends crutches, use them as instructed. Gradually put weight on the affected ankle. Continue to use crutches or a cane until you can walk without feeling pain in your ankle. °· If you have a plaster splint, wear the splint as directed by your caregiver. Do not rest it on anything harder than a pillow for the first 24 hours. Do not put weight on it. Do not get it wet. You may take it off to take a shower or bath. °· You may have been given an elastic bandage to wear around your ankle to provide support. If the elastic bandage is too tight (you have numbness or tingling in your foot or your foot becomes cold and blue), adjust the bandage to make it comfortable. °· If you have an air splint, you may blow more air into it or let air out to make it more comfortable. You may take your splint off at night and before taking a shower or bath. Wiggle your toes in the splint several times per day to decrease swelling. °SEEK MEDICAL CARE IF:  °· You have rapidly increasing bruising or swelling. °· Your toes feel extremely cold or you lose feeling in your foot. °· Your pain is not relieved with medicine. °SEEK IMMEDIATE MEDICAL CARE IF: °· Your toes are numb or blue. °·   You have severe pain that is increasing. °MAKE SURE YOU:  °· Understand these instructions. °· Will watch your condition. °· Will get help right away if you are not doing well or get worse. °  °This information is not intended to replace advice given to you by your health care provider. Make sure you discuss any questions you have with your health care provider. °  °Document Released: 08/31/2005 Document Revised: 09/21/2014 Document Reviewed: 09/12/2011 °Elsevier Interactive Patient Education ©2016 [employer] ° °Elastic Bandage and RICE °WHAT DOES AN ELASTIC BANDAGE DO? °Elastic bandages come in different shapes and sizes.  They generally provide support to your injury and reduce swelling while you are healing, but they can perform different functions. Your health care provider will help you to decide what is best for your protection, recovery, or rehabilitation following an injury. °WHAT ARE SOME GENERAL TIPS FOR USING AN ELASTIC BANDAGE? °· Use the bandage as directed by the maker of the bandage that you are using. °· Do not wrap the bandage too tightly. This may cut off the circulation in the arm or leg in the area below the bandage. °¨ If part of your body beyond the bandage becomes blue, numb, cold, swollen, or is more painful, your bandage is most likely too tight. If this occurs, remove your bandage and reapply it more loosely. °· See your health care provider if the bandage seems to be making your problems worse rather than better. °· An elastic bandage should be removed and reapplied every 3-4 hours or as directed by your health care provider. °WHAT IS RICE? °The routine care of many injuries includes rest, ice, compression, and elevation (RICE therapy).  °Rest °Rest is required to allow your body to heal. Generally, you can resume your routine activities when you are comfortable and have been given permission by your health care provider. °Ice °Icing your injury helps to keep the swelling down and it reduces pain. Do not apply ice directly to your skin. °· Put ice in a plastic bag. °· Place a towel between your skin and the bag. °· Leave the ice on for 20 minutes, 2-3 times per day. °Do this for as long as you are directed by your health care provider. °Compression °Compression helps to keep swelling down, gives support, and helps with discomfort. Compression may be done with an elastic bandage. °Elevation °Elevation helps to reduce swelling and it decreases pain. If possible, your injured area should be placed at or above the level of your heart or the center of your chest. °WHEN SHOULD I SEEK MEDICAL CARE? °You should seek  medical care if: °· You have persistent pain and swelling. °· Your symptoms are getting worse rather than improving. °These symptoms may indicate that further evaluation or further X-rays are needed. Sometimes, X-rays may not show a small broken bone (fracture) until a number of days later. Make a follow-up appointment with your health care provider. Ask when your X-ray results will be ready. Make sure that you get your X-ray results. °WHEN SHOULD I SEEK IMMEDIATE MEDICAL CARE? °You should seek immediate medical care if: °· You have a sudden onset of severe pain at or below the area of your injury. °· You develop redness or increased swelling around your injury. °· You have tingling or numbness at or below the area of your injury that does not improve after you remove the elastic bandage. °  °This information is not intended to replace advice given to you by your   health care provider. Make sure you discuss any questions you have with your health care provider. °  °Document Released: 02/20/2002 Document Revised: 05/22/2015 Document Reviewed: 04/16/2014 °Elsevier Interactive Patient Education ©2016 [employer] ° °

## 2016-03-11 NOTE — ED Notes (Signed)
Discharge instructions reviewed with patient. Patient verbalized understanding. Patient taken to lobby via wheelchair by husband.  

## 2016-03-12 DIAGNOSIS — M25571 Pain in right ankle and joints of right foot: Secondary | ICD-10-CM | POA: Insufficient documentation

## 2016-04-14 DIAGNOSIS — G8929 Other chronic pain: Secondary | ICD-10-CM | POA: Insufficient documentation

## 2016-04-14 DIAGNOSIS — M546 Pain in thoracic spine: Secondary | ICD-10-CM

## 2016-04-14 DIAGNOSIS — F411 Generalized anxiety disorder: Secondary | ICD-10-CM | POA: Insufficient documentation

## 2016-04-28 ENCOUNTER — Other Ambulatory Visit: Payer: Self-pay | Admitting: Orthopedic Surgery

## 2016-04-28 DIAGNOSIS — M25552 Pain in left hip: Secondary | ICD-10-CM

## 2016-05-05 ENCOUNTER — Telehealth: Payer: Self-pay | Admitting: Orthopedic Surgery

## 2016-05-05 NOTE — Telephone Encounter (Signed)
We have tried to contact the patient 3 times and each time it says wireless customer not accepting calls at this time. We have a contact of [phone] and [phone] and none of these are good contact numbers. We have sent the patient a letter to call us and schedule.

## 2016-05-08 DIAGNOSIS — M25551 Pain in right hip: Secondary | ICD-10-CM

## 2016-05-08 DIAGNOSIS — G8929 Other chronic pain: Secondary | ICD-10-CM | POA: Insufficient documentation

## 2016-05-26 ENCOUNTER — Inpatient Hospital Stay: Admission: RE | Admit: 2016-05-26 | Payer: Medicare Other | Source: Ambulatory Visit

## 2016-05-26 ENCOUNTER — Other Ambulatory Visit: Payer: Medicare Other

## 2016-06-04 ENCOUNTER — Other Ambulatory Visit: Payer: Self-pay | Admitting: Pain Medicine

## 2016-06-04 ENCOUNTER — Ambulatory Visit
Admission: RE | Admit: 2016-06-04 | Discharge: 2016-06-04 | Disposition: A | Discharge status: Home / Self Care | Payer: Medicare Other | Source: Ambulatory Visit | Attending: Pain Medicine | Admitting: Pain Medicine

## 2016-06-04 DIAGNOSIS — G8929 Other chronic pain: Secondary | ICD-10-CM | POA: Insufficient documentation

## 2016-06-04 DIAGNOSIS — M546 Pain in thoracic spine: Secondary | ICD-10-CM | POA: Insufficient documentation

## 2016-06-04 DIAGNOSIS — R52 Pain, unspecified: Secondary | ICD-10-CM

## 2016-06-08 ENCOUNTER — Ambulatory Visit
Admission: RE | Admit: 2016-06-08 | Discharge: 2016-06-08 | Disposition: A | Discharge status: Home / Self Care | Payer: Medicare Other | Source: Ambulatory Visit | Attending: Orthopedic Surgery | Admitting: Orthopedic Surgery

## 2016-06-08 DIAGNOSIS — M25552 Pain in left hip: Secondary | ICD-10-CM

## 2016-06-18 DIAGNOSIS — M1611 Unilateral primary osteoarthritis, right hip: Secondary | ICD-10-CM | POA: Diagnosis present

## 2016-06-20 ENCOUNTER — Encounter: Payer: Self-pay | Admitting: Emergency Medicine

## 2016-06-20 ENCOUNTER — Emergency Department
Admission: EM | Admit: 2016-06-20 | Discharge: 2016-06-20 | Disposition: A | Discharge status: Home / Self Care | Payer: Medicare Other | Attending: Student | Admitting: Student

## 2016-06-20 ENCOUNTER — Emergency Department: Payer: Medicare Other

## 2016-06-20 DIAGNOSIS — S300XXA Contusion of lower back and pelvis, initial encounter: Secondary | ICD-10-CM | POA: Diagnosis not present

## 2016-06-20 DIAGNOSIS — Z79899 Other long term (current) drug therapy: Secondary | ICD-10-CM | POA: Insufficient documentation

## 2016-06-20 DIAGNOSIS — Z7984 Long term (current) use of oral hypoglycemic drugs: Secondary | ICD-10-CM | POA: Diagnosis not present

## 2016-06-20 DIAGNOSIS — E119 Type 2 diabetes mellitus without complications: Secondary | ICD-10-CM | POA: Diagnosis not present

## 2016-06-20 DIAGNOSIS — Y9389 Activity, other specified: Secondary | ICD-10-CM | POA: Diagnosis not present

## 2016-06-20 DIAGNOSIS — Y92009 Unspecified place in unspecified non-institutional (private) residence as the place of occurrence of the external cause: Secondary | ICD-10-CM | POA: Diagnosis not present

## 2016-06-20 DIAGNOSIS — S8992XA Unspecified injury of left lower leg, initial encounter: Secondary | ICD-10-CM | POA: Diagnosis not present

## 2016-06-20 DIAGNOSIS — I2510 Atherosclerotic heart disease of native coronary artery without angina pectoris: Secondary | ICD-10-CM | POA: Insufficient documentation

## 2016-06-20 DIAGNOSIS — S3992XA Unspecified injury of lower back, initial encounter: Secondary | ICD-10-CM | POA: Diagnosis present

## 2016-06-20 DIAGNOSIS — W06XXXA Fall from bed, initial encounter: Secondary | ICD-10-CM | POA: Insufficient documentation

## 2016-06-20 DIAGNOSIS — W19XXXA Unspecified fall, initial encounter: Secondary | ICD-10-CM

## 2016-06-20 DIAGNOSIS — Y999 Unspecified external cause status: Secondary | ICD-10-CM | POA: Insufficient documentation

## 2016-06-20 DIAGNOSIS — S8990XA Unspecified injury of unspecified lower leg, initial encounter: Secondary | ICD-10-CM

## 2016-06-20 NOTE — ED Triage Notes (Signed)
States she fell when getting out of bed with walker today about 1 hour PTA.  States fell onto left knee and c/o left knee and right buttock pain.  Patient arrives via [facility] in wheelchair.

## 2016-06-20 NOTE — ED Notes (Signed)
Pt in room, placing on fentanyl patch from home

## 2016-06-20 NOTE — ED Provider Notes (Signed)
[facility] Emergency Department Provider Note ____________________________________________  Time seen: Approximately 3:59 PM  I have reviewed the triage vital signs and the nursing notes.   HISTORY  Chief Complaint Fall    HPI Amy Sweeney is a 65 y.o. female who presents to the emergency department for evaluationof left knee and right buttock pain. She had a mechanical, nonsyncopal fall while getting out of bed about an hour PTA. She states her walker slipped on the hardwood floor and she landed on her left knee then down onto her right buttock. She is wearing her fentanyl patch, but has not taken any of her oxycodone that is prescribed for breakthrough pain.  Past Medical History:  Diagnosis Date  . Anxiety   . Arthritis    degenerative artgritis  osteoarthritis  . Bipolar disorder (HCC)   . Coronary artery disease   . Diabetes mellitus without complication (HCC)   . Family history of adverse reaction to anesthesia    father blood clot after surgery died  . Headache    history of migraines    Patient Active Problem List   Diagnosis Date Noted  . Traumatic arthritis of knee 05/30/2015    Past Surgical History:  Procedure Laterality Date  . CARDIAC CATHETERIZATION  2004  . CESAREAN SECTION     x2  . CHOLECYSTECTOMY    . DILATION AND CURETTAGE OF UTERUS    . GASTRIC BYPASS  2004  . KNEE ARTHROSCOPY Left 2004 & 2006  . LAMINECTOMY  2014  . NECK SURGERY     removed the buldge of the disc and hardware.  Marland Kitchen PERIPHERAL VASCULAR CATHETERIZATION N/A 05/21/2015   Procedure: IVC Filter Insertion;  Surgeon: Renford Dills, MD;  Location: [facility] INVASIVE CV LAB;  Service: Cardiovascular;  Laterality: N/A;  . PERIPHERAL VASCULAR CATHETERIZATION N/A 07/23/2015   Procedure: IVC Filter Removal;  Surgeon: Renford Dills, MD;  Location: [facility] INVASIVE CV LAB;  Service: Cardiovascular;  Laterality: N/A;  . TOTAL KNEE ARTHROPLASTY Right 05/30/2015    Procedure: TOTAL KNEE ARTHROPLASTY;  Surgeon: Kennedy Bucker, MD;  Location: [facility] ORS;  Service: Orthopedics;  Laterality: Right;    Prior to Admission medications   Medication Sig Start Date End Date Taking? Authorizing Provider  acetaminophen (TYLENOL) 500 MG tablet Take 1,500 mg by mouth every 8 (eight) hours as needed.    Historical Provider, MD  apixaban (ELIQUIS) 5 MG TABS tablet Take 5 mg by mouth 2 (two) times daily.    Historical Provider, MD  baclofen (LIORESAL) 10 MG tablet Take 5 mg by mouth 2 (two) times daily. As needed for muscle spasms    Historical Provider, MD  CALCIUM CITRATE PO Take 600 mg by mouth. 4 tablets taken every am    Historical Provider, MD  Cholecalciferol (VITAMIN D3) 5000 UNITS CAPS Take 5 capsules by mouth. 5 capsules every am    Historical Provider, MD  clonazePAM (KLONOPIN) 1 MG tablet Take 1 mg by mouth 3 (three) times daily as needed for anxiety.     Historical Provider, MD  Cyanocobalamin (VITAMIN B-12) 2500 MCG SUBL Place 2 tablets under the tongue every morning.    Historical Provider, MD  diphenhydrAMINE (SOMINEX) 25 MG tablet Take 50 mg by mouth at bedtime.    Historical Provider, MD  divalproex (DEPAKOTE) 500 MG DR tablet Take 1,500 mg by mouth every morning. 3 tablets every am    Historical Provider, MD  DULoxetine (CYMBALTA) 60 MG capsule Take 60 mg by  mouth daily. 2 capsules in the am    Historical Provider, MD  enoxaparin (LOVENOX) 30 MG/0.3ML injection Inject 0.3 mLs (30 mg total) into the skin every 12 (twelve) hours. Patient not taking: Reported on 07/23/2015 06/02/15   Evon Slackhomas C Gaines, PA-C  fentaNYL (DURAGESIC - DOSED MCG/HR) 50 MCG/HR Place 50 mcg onto the skin every 3 (three) days.    Historical Provider, MD  fentaNYL (DURAGESIC - DOSED MCG/HR) 50 MCG/HR Place 1 patch (50 mcg total) onto the skin every 3 (three) days. Patient taking differently: Place 75 mcg onto the skin every 3 (three) days.  06/02/15   Evon Slackhomas C Gaines, PA-C  ferrous sulfate 325  (65 FE) MG tablet Take 325 mg by mouth daily with breakfast. 2 tablets in am    Historical Provider, MD  fluticasone (FLONASE) 50 MCG/ACT nasal spray Place 2 sprays into both nostrils every morning.    Historical Provider, MD  gabapentin (NEURONTIN) 300 MG capsule Take 1,200 mg by mouth every 6 (six) hours.     Historical Provider, MD  Ginkgo Biloba 60 MG CAPS Take 2 capsules by mouth every morning.    Historical Provider, MD  Magnesium Oxide -Mg Supplement 250 MG TABS Take 2 tablets by mouth every morning.    Historical Provider, MD  Melatonin 1 MG TABS Take 1 tablet (1 mg total) by mouth at bedtime as needed. 03/11/16   Delorise RoyalsJonathan D Cuthriell, PA-C  metFORMIN (GLUCOPHAGE) 1000 MG tablet Take 1,000 mg by mouth daily with breakfast. 2 capsules at breakfast    Historical Provider, MD  Multiple Vitamins-Minerals (ONE-A-DAY WOMENS 50 PLUS PO) Take 2 tablets by mouth every morning.    Historical Provider, MD  oxyCODONE (OXY IR/ROXICODONE) 5 MG immediate release tablet Take 1-2 tablets (5-10 mg total) by mouth every 3 (three) hours as needed for breakthrough pain. 06/02/15   Evon Slackhomas C Gaines, PA-C  OxyCODONE (OXYCONTIN) 20 mg T12A 12 hr tablet Take 1 tablet (20 mg total) by mouth every 12 (twelve) hours. Patient not taking: Reported on 07/23/2015 06/02/15   Evon Slackhomas C Gaines, PA-C  Oxycodone HCl 10 MG TABS Take 10 mg by mouth every 8 (eight) hours as needed.    Historical Provider, MD  traZODone (DESYREL) 100 MG tablet Take 100 mg by mouth at bedtime.    Historical Provider, MD  traZODone (DESYREL) 100 MG tablet Take 1 tablet (100 mg total) by mouth at bedtime. 03/11/16   Delorise RoyalsJonathan D Cuthriell, PA-C  vitamin E 1000 UNIT capsule Take 1,000 Units by mouth daily. 2 capsules in am    Historical Provider, MD  Zinc 50 MG TABS Take 100 mg by mouth. In am    Historical Provider, MD    Allergies Bee venom; Morphine and related; Adhesive [tape]; Celebrex [celecoxib]; Erythromycin; Tegaderm ag mesh [silver]; and Levaquin  [levofloxacin in d5w]  No family history on file.  Social History Social History  Substance Use Topics  . Smoking status: Never Smoker  . Smokeless tobacco: Never Used  . Alcohol use No    Review of Systems Constitutional: No recent illness. Cardiovascular: Denies chest pain or palpitations. Respiratory: Denies shortness of breath. Musculoskeletal: Pain in left knee and coccyx. Skin: Negative for rash, wound, lesion. Neurological: Negative for focal weakness or numbness.  ____________________________________________   PHYSICAL EXAM:  VITAL SIGNS: ED Triage Vitals  Enc Vitals Group     BP 06/20/16 1500 116/65     Pulse Rate 06/20/16 1500 96     Resp 06/20/16 1500 15  Temp 06/20/16 1500 98.5 F (36.9 C)     Temp Source 06/20/16 1500 Oral     SpO2 06/20/16 1500 93 %     Weight 06/20/16 1457 260 lb (117.9 kg)     Height 06/20/16 1457 5\' 8"  (1.727 m)     Head Circumference --      Peak Flow --      Pain Score 06/20/16 1457 8     Pain Loc --      Pain Edu? --      Excl. in GC? --     Constitutional: Alert and oriented. Well appearing and in no acute distress. Eyes: Conjunctivae are normal. EOMI. Head: Atraumatic. Neck: No stridor.  Respiratory: Normal respiratory effort.   Musculoskeletal: Left knee pain increases with full flexion. No obvious effusion or deformity. Tenderness to palpation over the coccyx. Neurologic:  Normal speech and language. No gross focal neurologic deficits are appreciated. Speech is normal. No gait instability.  Skin:  Skin is warm, dry and intact. Atraumatic. Psychiatric: Mood and affect are normal. Speech and behavior are normal.  ____________________________________________   LABS (all labs ordered are listed, but only abnormal results are displayed)  Labs Reviewed - No data to display ____________________________________________  RADIOLOGY  Left knee and sacrum/coccyx is negative for acute bony abnormality per  radiology. ____________________________________________   PROCEDURES  Procedure(s) performed: Jones wrap applied to left knee by RN. Patient was neurovascularly intact post-application.   ____________________________________________   INITIAL IMPRESSION / ASSESSMENT AND PLAN / ED COURSE  Clinical Course    Pertinent labs & imaging results that were available during my care of the patient were reviewed by me and considered in my medical decision making (see chart for details).  Patient was encouraged to follow up with Dr. Rosita Kea. No additional Rx given today as she has pain medications at home and applied a fresh Fentanyl patch today. Husband is present to provide assistance at home. ____________________________________________   FINAL CLINICAL IMPRESSION(S) / ED DIAGNOSES  Final diagnoses:  Fall in home, initial encounter  Knee injury, initial encounter  Coccyx contusion, initial encounter       Chinita Pester, FNP 06/21/16 1148    Governor Rooks, MD 06/21/16 1535

## 2016-07-15 ENCOUNTER — Other Ambulatory Visit: Payer: Medicare Other

## 2016-07-16 ENCOUNTER — Ambulatory Visit (INDEPENDENT_AMBULATORY_CARE_PROVIDER_SITE_OTHER): Payer: Medicare Other | Admitting: Vascular Surgery

## 2016-07-16 ENCOUNTER — Encounter (INDEPENDENT_AMBULATORY_CARE_PROVIDER_SITE_OTHER): Payer: Self-pay | Admitting: Vascular Surgery

## 2016-07-16 ENCOUNTER — Encounter (INDEPENDENT_AMBULATORY_CARE_PROVIDER_SITE_OTHER): Payer: Self-pay

## 2016-07-16 DIAGNOSIS — Z86718 Personal history of other venous thrombosis and embolism: Secondary | ICD-10-CM | POA: Diagnosis not present

## 2016-07-16 NOTE — Progress Notes (Signed)
Subjective:    Patient ID: Amy Sweeney, female    DOB: March 04, 1951, 65 y.o.   MRN: [id number] Chief Complaint  Patient presents with  . Follow-up   Patient presents at request of Dr. Rosita Kea for IVC filter placement. Patient has history of DVT about three years ago and will being having a left THR on 07/30/16. Her upcoming surgery will require her to be off her blood thinners. An IVC filter is being requested to prevent any PE in the post-operative period.    Review of Systems  Constitutional: Negative.   HENT: Negative.   Eyes: Negative.   Respiratory: Negative.   Cardiovascular: Negative.   Gastrointestinal: Negative.   Endocrine: Negative.   Genitourinary: Negative.   Musculoskeletal: Negative.   Skin: Negative.   Allergic/Immunologic: Negative.   Neurological: Negative.   Hematological: Negative.   Psychiatric/Behavioral: Negative.       Objective:   Physical Exam  Constitutional: She is oriented to person, place, and time. She appears well-developed and well-nourished.  HENT:  Head: Normocephalic and atraumatic.  Eyes: Conjunctivae and EOM are normal. Pupils are equal, round, and reactive to light.  Neck: Normal range of motion.  Cardiovascular: Normal rate, regular rhythm, normal heart sounds and intact distal pulses.   Pulses:      Radial pulses are 2+ on the right side, and 2+ on the left side.       Dorsalis pedis pulses are 2+ on the right side, and 2+ on the left side.       Posterior tibial pulses are 2+ on the right side, and 2+ on the left side.  Pulmonary/Chest: Effort normal and breath sounds normal.  Abdominal: Soft. Bowel sounds are normal.  Musculoskeletal: Normal range of motion. She exhibits no edema.  Neurological: She is alert and oriented to person, place, and time.  Skin: Skin is warm and dry.  Psychiatric: She has a normal mood and affect. Her behavior is normal. Judgment and thought content normal.   BP 125/79   Pulse 79   Resp 17   Ht  5' 8.5" (1.74 m)   Wt 255 lb (115.7 kg)   BMI 38.21 kg/m   Past Medical History:  Diagnosis Date  . Anxiety   . Arthritis    degenerative artgritis  osteoarthritis  . Bipolar disorder (HCC)   . Coronary artery disease   . Diabetes mellitus without complication (HCC)   . Family history of adverse reaction to anesthesia    father blood clot after surgery died  . Headache    history of migraines   Social History   Social History  . Marital status: Married    Spouse name: N/A  . Number of children: N/A  . Years of education: N/A   Occupational History  . Not on file.   Social History Main Topics  . Smoking status: Never Smoker  . Smokeless tobacco: Never Used  . Alcohol use No  . Drug use: No  . Sexual activity: Yes    Birth control/ protection: Post-menopausal   Other Topics Concern  . Not on file   Social History Narrative  . No narrative on file   Past Surgical History:  Procedure Laterality Date  . CARDIAC CATHETERIZATION  2004  . CESAREAN SECTION     x2  . CHOLECYSTECTOMY    . DILATION AND CURETTAGE OF UTERUS    . GASTRIC BYPASS  2004  . KNEE ARTHROSCOPY Left 2004 & 2006  . LAMINECTOMY  2014  . NECK SURGERY     removed the buldge of the disc and hardware.  Marland Kitchen. PERIPHERAL VASCULAR CATHETERIZATION N/A 05/21/2015   Procedure: IVC Filter Insertion;  Surgeon: Renford DillsGregory G Schnier, MD;  Location: [facility] INVASIVE CV LAB;  Service: Cardiovascular;  Laterality: N/A;  . PERIPHERAL VASCULAR CATHETERIZATION N/A 07/23/2015   Procedure: IVC Filter Removal;  Surgeon: Renford DillsGregory G Schnier, MD;  Location: [facility] INVASIVE CV LAB;  Service: Cardiovascular;  Laterality: N/A;  . TOTAL KNEE ARTHROPLASTY Right 05/30/2015   Procedure: TOTAL KNEE ARTHROPLASTY;  Surgeon: Kennedy BuckerMichael Menz, MD;  Location: [facility] ORS;  Service: Orthopedics;  Laterality: Right;   No family history on file.  Allergies  Allergen Reactions  . Bee Venom Anaphylaxis  . Morphine And Related Anaphylaxis  . Adhesive [Tape]  Other (See Comments)    Itching and water blisters. Paper tape is OK.  . Celebrex [Celecoxib] Swelling  . Erythromycin Rash  . Tegaderm Ag Mesh [Silver] Other (See Comments)    itching and water blisters itching and water blisters  . Levaquin [Levofloxacin In D5w] Diarrhea      Assessment & Plan:  Patient presents at request of Dr. Rosita KeaMenz for IVC filter placement. Patient has history of DVT about three years ago and will being having a left THR on 07/30/16. Her upcoming surgery will require her to be off her blood thinners. An IVC filter is being requested to prevent any PE in the post-operative period.   1. History of DVT (deep vein thrombosis) - Stable Patient with history of DVT in past. Will place IVC filter at request of Dr. Rosita KeaMenz to protect patient from PE while off of anti-coagulation. Procedure, risks and benefits explained to patient. All questions answered. Patient would like to proceed. Will plan on removal 6-8 weeks after placement. Patient expresses her understanding.   Current Outpatient Prescriptions on File Prior to Visit  Medication Sig Dispense Refill  . acetaminophen (TYLENOL) 500 MG tablet Take 1,500 mg by mouth every 8 (eight) hours as needed.    Marland Kitchen. apixaban (ELIQUIS) 5 MG TABS tablet Take 5 mg by mouth 2 (two) times daily.    . baclofen (LIORESAL) 10 MG tablet Take 5 mg by mouth 2 (two) times daily. As needed for muscle spasms    . CALCIUM CITRATE PO Take 600 mg by mouth. 4 tablets taken every am    . Cholecalciferol (VITAMIN D3) 5000 UNITS CAPS Take 5 capsules by mouth. 5 capsules every am    . clonazePAM (KLONOPIN) 1 MG tablet Take 1 mg by mouth 3 (three) times daily as needed for anxiety.     . Cyanocobalamin (VITAMIN B-12) 2500 MCG SUBL Place 2 tablets under the tongue every morning.    . diphenhydrAMINE (SOMINEX) 25 MG tablet Take 50 mg by mouth at bedtime.    . divalproex (DEPAKOTE) 500 MG DR tablet Take 1,500 mg by mouth every morning. 3 tablets every am    .  DULoxetine (CYMBALTA) 60 MG capsule Take 60 mg by mouth daily. 2 capsules in the am    . enoxaparin (LOVENOX) 30 MG/0.3ML injection Inject 0.3 mLs (30 mg total) into the skin every 12 (twelve) hours. 14 Syringe 0  . fentaNYL (DURAGESIC - DOSED MCG/HR) 50 MCG/HR Place 50 mcg onto the skin every 3 (three) days.    . fentaNYL (DURAGESIC - DOSED MCG/HR) 50 MCG/HR Place 1 patch (50 mcg total) onto the skin every 3 (three) days. (Patient taking differently: Place 75 mcg onto the skin every  3 (three) days. ) 5 patch 0  . ferrous sulfate 325 (65 FE) MG tablet Take 325 mg by mouth daily with breakfast. 2 tablets in am    . fluticasone (FLONASE) 50 MCG/ACT nasal spray Place 2 sprays into both nostrils every morning.    . gabapentin (NEURONTIN) 300 MG capsule Take 1,200 mg by mouth every 6 (six) hours.     . Ginkgo Biloba 60 MG CAPS Take 2 capsules by mouth every morning.    . Magnesium Oxide -Mg Supplement 250 MG TABS Take 2 tablets by mouth every morning.    . Melatonin 1 MG TABS Take 1 tablet (1 mg total) by mouth at bedtime as needed. 5 tablet 0  . metFORMIN (GLUCOPHAGE) 1000 MG tablet Take 1,000 mg by mouth daily with breakfast. 2 capsules at breakfast    . Multiple Vitamins-Minerals (ONE-A-DAY WOMENS 50 PLUS PO) Take 2 tablets by mouth every morning.    . OxyCODONE (OXYCONTIN) 20 mg T12A 12 hr tablet Take 1 tablet (20 mg total) by mouth every 12 (twelve) hours. 60 tablet 0  . Oxycodone HCl 10 MG TABS Take 10 mg by mouth every 8 (eight) hours as needed.    . traZODone (DESYREL) 100 MG tablet Take 100 mg by mouth at bedtime.    . traZODone (DESYREL) 100 MG tablet Take 1 tablet (100 mg total) by mouth at bedtime. 5 tablet 0  . vitamin E 1000 UNIT capsule Take 1,000 Units by mouth daily. 2 capsules in am    . Zinc 50 MG TABS Take 100 mg by mouth. In am    . oxyCODONE (OXY IR/ROXICODONE) 5 MG immediate release tablet Take 1-2 tablets (5-10 mg total) by mouth every 3 (three) hours as needed for breakthrough  pain. (Patient not taking: Reported on 07/16/2016) 30 tablet 0   No current facility-administered medications on file prior to visit.     There are no Patient Instructions on file for this visit. No Follow-up on file.   Alandra Sando A Sylvi Rybolt, PA-C

## 2016-07-16 NOTE — Progress Notes (Signed)
fol

## 2016-07-20 ENCOUNTER — Other Ambulatory Visit (INDEPENDENT_AMBULATORY_CARE_PROVIDER_SITE_OTHER): Payer: Self-pay | Admitting: Vascular Surgery

## 2016-07-20 ENCOUNTER — Telehealth (INDEPENDENT_AMBULATORY_CARE_PROVIDER_SITE_OTHER): Payer: Self-pay

## 2016-07-20 ENCOUNTER — Ambulatory Visit (INDEPENDENT_AMBULATORY_CARE_PROVIDER_SITE_OTHER): Payer: Medicare Other | Admitting: Vascular Surgery

## 2016-07-20 NOTE — Telephone Encounter (Signed)
Patient called wanting to know who scheduled her for a procedure and that no one told her about being scheduled for a procedure. I explained to the patient that she was seen on 07/16/16 by Raul DelKim Stegmayer the P.A. And a IVC filter placement was scheduled and the pre-procedure information was given to the patient. She then stated she found her paperwork.

## 2016-07-21 ENCOUNTER — Encounter
Admission: RE | Disposition: A | Discharge status: Home / Self Care | Payer: Self-pay | Source: Ambulatory Visit | Attending: Vascular Surgery

## 2016-07-21 ENCOUNTER — Encounter: Payer: Self-pay | Admitting: *Deleted

## 2016-07-21 ENCOUNTER — Ambulatory Visit
Admission: RE | Admit: 2016-07-21 | Discharge: 2016-07-21 | Disposition: A | Discharge status: Home / Self Care | Payer: Medicare Other | Source: Ambulatory Visit | Attending: Vascular Surgery | Admitting: Vascular Surgery

## 2016-07-21 DIAGNOSIS — Z91030 Bee allergy status: Secondary | ICD-10-CM | POA: Insufficient documentation

## 2016-07-21 DIAGNOSIS — I82409 Acute embolism and thrombosis of unspecified deep veins of unspecified lower extremity: Secondary | ICD-10-CM | POA: Insufficient documentation

## 2016-07-21 DIAGNOSIS — Z9884 Bariatric surgery status: Secondary | ICD-10-CM | POA: Insufficient documentation

## 2016-07-21 DIAGNOSIS — Z9049 Acquired absence of other specified parts of digestive tract: Secondary | ICD-10-CM | POA: Insufficient documentation

## 2016-07-21 DIAGNOSIS — Z881 Allergy status to other antibiotic agents status: Secondary | ICD-10-CM | POA: Insufficient documentation

## 2016-07-21 DIAGNOSIS — Z888 Allergy status to other drugs, medicaments and biological substances status: Secondary | ICD-10-CM | POA: Diagnosis not present

## 2016-07-21 DIAGNOSIS — I2510 Atherosclerotic heart disease of native coronary artery without angina pectoris: Secondary | ICD-10-CM | POA: Diagnosis not present

## 2016-07-21 DIAGNOSIS — Z86718 Personal history of other venous thrombosis and embolism: Secondary | ICD-10-CM | POA: Diagnosis not present

## 2016-07-21 DIAGNOSIS — M1990 Unspecified osteoarthritis, unspecified site: Secondary | ICD-10-CM | POA: Diagnosis not present

## 2016-07-21 DIAGNOSIS — I2699 Other pulmonary embolism without acute cor pulmonale: Secondary | ICD-10-CM | POA: Diagnosis not present

## 2016-07-21 DIAGNOSIS — Z9109 Other allergy status, other than to drugs and biological substances: Secondary | ICD-10-CM | POA: Insufficient documentation

## 2016-07-21 DIAGNOSIS — E119 Type 2 diabetes mellitus without complications: Secondary | ICD-10-CM | POA: Insufficient documentation

## 2016-07-21 DIAGNOSIS — Z885 Allergy status to narcotic agent status: Secondary | ICD-10-CM | POA: Insufficient documentation

## 2016-07-21 DIAGNOSIS — Z7902 Long term (current) use of antithrombotics/antiplatelets: Secondary | ICD-10-CM | POA: Diagnosis not present

## 2016-07-21 DIAGNOSIS — G43909 Migraine, unspecified, not intractable, without status migrainosus: Secondary | ICD-10-CM | POA: Insufficient documentation

## 2016-07-21 DIAGNOSIS — Z96651 Presence of right artificial knee joint: Secondary | ICD-10-CM | POA: Insufficient documentation

## 2016-07-21 HISTORY — PX: PERIPHERAL VASCULAR CATHETERIZATION: SHX172C

## 2016-07-21 SURGERY — IVC FILTER INSERTION
Anesthesia: Moderate Sedation

## 2016-07-21 MED ORDER — ONDANSETRON HCL 4 MG/2ML IJ SOLN: 4.0000 mg | Freq: Four times a day (QID) | INTRAMUSCULAR | Status: DC | PRN

## 2016-07-21 MED ORDER — LIDOCAINE HCL (PF) 1 % IJ SOLN
INTRAMUSCULAR | Status: AC
  Filled 2016-07-21: qty 5

## 2016-07-21 MED ORDER — FENTANYL CITRATE (PF) 100 MCG/2ML IJ SOLN
INTRAMUSCULAR | Status: DC | PRN
  Administered 2016-07-21 (×4): 50 ug via INTRAVENOUS

## 2016-07-21 MED ORDER — MIDAZOLAM HCL 2 MG/2ML IJ SOLN
INTRAMUSCULAR | Status: AC
  Filled 2016-07-21: qty 2

## 2016-07-21 MED ORDER — METHYLPREDNISOLONE SODIUM SUCC 125 MG IJ SOLR: 125.0000 mg | INTRAMUSCULAR | Status: DC | PRN

## 2016-07-21 MED ORDER — FENTANYL CITRATE (PF) 100 MCG/2ML IJ SOLN
INTRAMUSCULAR | Status: AC
  Filled 2016-07-21: qty 2

## 2016-07-21 MED ORDER — CEFUROXIME SODIUM 1.5 G IJ SOLR
1.5000 g | INTRAMUSCULAR | Status: AC
  Administered 2016-07-21: 1.5 g via INTRAVENOUS

## 2016-07-21 MED ORDER — DIPHENHYDRAMINE HCL 50 MG/ML IJ SOLN
INTRAMUSCULAR | Status: DC | PRN
  Administered 2016-07-21: 50 mg via INTRAVENOUS

## 2016-07-21 MED ORDER — FAMOTIDINE 20 MG PO TABS: 40.0000 mg | ORAL_TABLET | ORAL | Status: DC | PRN

## 2016-07-21 MED ORDER — IOPAMIDOL (ISOVUE-300) INJECTION 61%
INTRAVENOUS | Status: DC | PRN
  Administered 2016-07-21: 15 mL via INTRAVENOUS

## 2016-07-21 MED ORDER — DIPHENHYDRAMINE HCL 50 MG/ML IJ SOLN
INTRAMUSCULAR | Status: AC
  Filled 2016-07-21: qty 1

## 2016-07-21 MED ORDER — HEPARIN SODIUM (PORCINE) 1000 UNIT/ML IJ SOLN
INTRAMUSCULAR | Status: AC
Start: 2016-07-21 — End: 2016-07-21
  Filled 2016-07-21: qty 1

## 2016-07-21 MED ORDER — MIDAZOLAM HCL 5 MG/5ML IJ SOLN
INTRAMUSCULAR | Status: AC
  Filled 2016-07-21: qty 5

## 2016-07-21 MED ORDER — SODIUM CHLORIDE 0.9 % IV SOLN: INTRAVENOUS | Status: DC

## 2016-07-21 MED ORDER — MIDAZOLAM HCL 2 MG/2ML IJ SOLN
INTRAMUSCULAR | Status: DC | PRN
  Administered 2016-07-21 (×4): 1 mg via INTRAVENOUS
  Administered 2016-07-21: 2 mg via INTRAVENOUS
  Administered 2016-07-21: 1 mg via INTRAVENOUS

## 2016-07-21 SURGICAL SUPPLY — 3 items
KIT FEMORAL DEL DENALI (Miscellaneous) ×2 IMPLANT
PACK ANGIOGRAPHY (CUSTOM PROCEDURE TRAY) ×2 IMPLANT
WIRE J 3MM .035X145CM (WIRE) ×2 IMPLANT

## 2016-07-21 NOTE — Discharge Instructions (Signed)
Inferior Vena Cava Filter Insertion, Care After  Refer to this sheet in the next few weeks. These instructions provide you with information on caring for yourself after your procedure. Your health care provider may also give you more specific instructions. Your treatment has been planned according to current medical practices, but problems sometimes occur. Call your health care provider if you have any problems or questions after your procedure.  WHAT TO EXPECT AFTER THE PROCEDURE  After your procedure, it is typical to have the following:   Mild pain in the area where the filter was inserted.   Mild bruising in the area where the filter was inserted.  HOME CARE INSTRUCTIONS   You will be given medicine to control pain. Only take over-the-counter or prescription medicines for pain, fever, or discomfort as directed by your health care provider.   A bandage (dressing) has been placed over the insertion site. Follow your health care provider's instructions on how to care for it.   Keep the insertion site clean and dry.   Do not soak in a bath tub or pool until the filter insertion site has healed.   Do not drive if you are taking narcotic pain medicines. Follow your health care provider's instructions about driving.   Do not return to work or school until your health care provider says it is okay.    Keep all follow-up appointments.   SEEK IMMEDIATE MEDICAL CARE IF:   You develop swelling and discoloration or pain in the legs.   Your legs become pale and cold or blue.   You develop shortness of breath, feel faint, or pass out.   You develop chest pain, a cough, or difficulty breathing.   You cough up blood.   You develop a rash or feel you are having problems that may be a side effect of medicines.   You develop weakness, difficulty moving your arms or legs, or balance problems.   You develop problems with speech or vision.     This information is not intended to replace advice given to you by your  health care provider. Make sure you discuss any questions you have with your health care provider.     Document Released: 06/21/2013 Document Reviewed: 06/21/2013  Elsevier Interactive Patient Education 2016 [employer]

## 2016-07-21 NOTE — H&P (Signed)
[facility] History & Physical Update  The patient was interviewed and re-examined.  The patient's previous History and Physical has been reviewed and is unchanged.  There is no change in the plan of care. We plan to proceed with the scheduled procedure.  Levora DredgeGregory Schnier, MD  07/21/2016, 1:49 PM

## 2016-07-21 NOTE — Progress Notes (Signed)
Pt here today for ivc filter placement, vitals stable, will continue to monitor pt throughout entire procedure, including etco2,

## 2016-07-21 NOTE — Op Note (Signed)
[facility]   OPERATIVE NOTE    PRE-OPERATIVE DIAGNOSIS: DVT with PE  POST-OPERATIVE DIAGNOSIS: Same  PROCEDURE: 1.   Ultrasound guidance for vascular access to the right common femoral vein 2.   Catheter placement into the inferior vena cava 3.   Inferior venacavogram 4.   Placement of a [place] IVC filter  SURGEON: Levora DredgeGregory Jarae Panas  ASSISTANT(S): None  ANESTHESIA: Conscious sedation was administered under my direct supervision. IV Versed plus fentanyl were utilized. Continuous ECG, pulse oximetry and blood pressure was monitored throughout the entire procedure. Conscious sedation was for a total of 20 minutes.  ESTIMATED BLOOD LOSS: minimal  FINDING(S): 1.  Patent IVC  SPECIMEN(S):  none  INDICATIONS:   Amy Sweeney is a 65 y.o. y.o. female who presents with history of DVT and PE an upcoming surgery.  Inferior vena cava filter is indicated for this reason.  Risks and benefits including filter thrombosis, migration, fracture, bleeding, and infection were all discussed.  We discussed that all IVC filters that we place can be removed if desired from the patient once the need for the filter has passed.    DESCRIPTION: After obtaining full informed written consent, the patient was brought back to the vascular suite. The skin was sterilely prepped and draped in a sterile surgical field was created. The right common femoral vein was accessed under direct ultrasound guidance without difficulty with a Seldinger needle and a J-wire was then placed. The dilator is passed over the wire and the delivery sheath was placed into the inferior vena cava.  Inferior venacavogram was performed. This demonstrated a patent IVC with the level of the renal veins at L1.  The filter was then deployed into the inferior vena cava at the level of L2 just below the renal veins. The delivery sheath was then removed. Pressure was held. Sterile dressings were placed. The patient  tolerated the procedure well and was taken to the recovery room in stable condition.  Interpretation: IVC widely patent measuring approximately 22 mm in diameter reflux of contrast into the renal veins at the level of L1 filter deployed an excellent orientation  COMPLICATIONS: None  CONDITION: Stable  Levora DredgeGregory Kaitlen Redford  07/21/2016, 2:42 PM

## 2016-07-22 ENCOUNTER — Other Ambulatory Visit (HOSPITAL_COMMUNITY): Payer: Self-pay

## 2016-07-22 ENCOUNTER — Encounter: Payer: Self-pay | Admitting: Vascular Surgery

## 2016-07-23 ENCOUNTER — Encounter
Admission: RE | Admit: 2016-07-23 | Discharge: 2016-07-23 | Disposition: A | Discharge status: Home / Self Care | Payer: Medicare Other | Source: Ambulatory Visit | Attending: Orthopedic Surgery | Admitting: Orthopedic Surgery

## 2016-07-23 DIAGNOSIS — Z01812 Encounter for preprocedural laboratory examination: Secondary | ICD-10-CM | POA: Insufficient documentation

## 2016-07-23 DIAGNOSIS — M1612 Unilateral primary osteoarthritis, left hip: Secondary | ICD-10-CM | POA: Insufficient documentation

## 2016-07-23 HISTORY — DX: Acute embolism and thrombosis of unspecified deep veins of unspecified lower extremity: I82.409

## 2016-07-23 HISTORY — DX: Post-traumatic stress disorder, unspecified: F43.10

## 2016-07-23 HISTORY — DX: Localized edema: R60.0

## 2016-07-23 HISTORY — DX: Obsessive-compulsive disorder, unspecified: F42.9

## 2016-07-23 HISTORY — DX: Chronic obstructive pulmonary disease, unspecified: J44.9

## 2016-07-23 HISTORY — DX: Dyspnea, unspecified: R06.00

## 2016-07-23 HISTORY — DX: Unspecified fracture of unspecified lower leg, subsequent encounter for closed fracture with nonunion: S82.90XK

## 2016-07-23 HISTORY — DX: Age-related osteoporosis without current pathological fracture: M81.0

## 2016-07-23 HISTORY — DX: Personal history of pulmonary embolism: Z86.711

## 2016-07-23 HISTORY — DX: Other cervical disc degeneration, unspecified cervical region: M50.30

## 2016-07-23 HISTORY — DX: Sleep terrors (night terrors): F51.4

## 2016-07-23 LAB — CBC
HCT: 41.1 % (ref 35.0–47.0)
HEMOGLOBIN: 13.4 g/dL (ref 12.0–16.0)
MCH: 31.4 pg (ref 26.0–34.0)
MCHC: 32.6 g/dL (ref 32.0–36.0)
MCV: 96.3 fL (ref 80.0–100.0)
Platelets: 215 10*3/uL (ref 150–440)
RBC: 4.27 MIL/uL (ref 3.80–5.20)
RDW: 13.6 % (ref 11.5–14.5)
WBC: 7.3 10*3/uL (ref 3.6–11.0)

## 2016-07-23 LAB — TYPE AND SCREEN
ABO/RH(D): A POS
Antibody Screen: NEGATIVE

## 2016-07-23 LAB — BASIC METABOLIC PANEL
Anion gap: 11 (ref 5–15)
BUN: 20 mg/dL (ref 6–20)
CO2: 25 mmol/L (ref 22–32)
Calcium: 9.8 mg/dL (ref 8.9–10.3)
Chloride: 101 mmol/L (ref 101–111)
Creatinine, Ser: 0.76 mg/dL (ref 0.44–1.00)
Glucose, Bld: 146 mg/dL — ABNORMAL HIGH (ref 65–99)
POTASSIUM: 5.1 mmol/L (ref 3.5–5.1)
SODIUM: 137 mmol/L (ref 135–145)

## 2016-07-23 LAB — URINALYSIS COMPLETE WITH MICROSCOPIC (ARMC ONLY)
BACTERIA UA: NONE SEEN
BILIRUBIN URINE: NEGATIVE
Glucose, UA: NEGATIVE mg/dL
Hgb urine dipstick: NEGATIVE
Nitrite: NEGATIVE
PH: 5 (ref 5.0–8.0)
PROTEIN: NEGATIVE mg/dL
Specific Gravity, Urine: 1.013 (ref 1.005–1.030)

## 2016-07-23 LAB — PROTIME-INR
INR: 0.96
PROTHROMBIN TIME: 12.8 s (ref 11.4–15.2)

## 2016-07-23 LAB — SEDIMENTATION RATE: SED RATE: 39 mm/h — AB (ref 0–30)

## 2016-07-23 LAB — APTT: aPTT: 31 seconds (ref 24–36)

## 2016-07-23 LAB — SURGICAL PCR SCREEN
MRSA, PCR: NEGATIVE
Staphylococcus aureus: NEGATIVE

## 2016-07-23 NOTE — Patient Instructions (Signed)
  Your procedure is scheduled on: 07/30/16 Thurs Report to Same Day Surgery 2nd floor medical mall To find out your arrival time please call [phone] between 1PM - 3PM on 07/29/16 Wed  Remember: Instructions that are not followed completely may result in serious medical risk, up to and including death, or upon the discretion of your surgeon and anesthesiologist your surgery may need to be rescheduled.    _x___ 1. Do not eat food or drink liquids after midnight. No gum chewing or hard candies.     __x__ 2. No Alcohol for 24 hours before or after surgery.   __x__3. No Smoking for 24 prior to surgery.   ____  4. Bring all medications with you on the day of surgery if instructed.    __x__ 5. Notify your doctor if there is any change in your medical condition     (cold, fever, infections).     Do not wear jewelry, make-up, hairpins, clips or nail polish.  Do not wear lotions, powders, or perfumes. You may wear deodorant.  Do not shave 48 hours prior to surgery. Men may shave face and neck.  Do not bring valuables to the hospital.    [facility] is not responsible for any belongings or valuables.               Contacts, dentures or bridgework may not be worn into surgery.  Leave your suitcase in the car. After surgery it may be brought to your room.  For patients admitted to the hospital, discharge time is determined by your treatment team.   Patients discharged the day of surgery will not be allowed to drive home.    Please read over the following fact sheets that you were given:   [facility] Preparing [facility] and or MRSA Information   _x___ Take these medicines the morning of surgery with A SIP OF WATER:    1. baclofen (LIORESAL) 10 MG tablet  2.clonazePAM (KLONOPIN) 0.5 MG tablet  3.divalproex (DEPAKOTE) 500 MG DR tablet  4.DULoxetine (CYMBALTA) 60 MG capsule  5.gabapentin (NEURONTIN) 400 MG capsule  6.Oxycodone HCl 10 MG TABS  ____Fleets enema or Magnesium Citrate as  directed.   _x___ Use CHG Soap or sage wipes as directed on instruction sheet   ____ Use inhalers on the day of surgery and bring to hospital day of surgery  _x___ Stop metformin 2 days prior to surgery    ____ Take 1/2 of usual insulin dose the night before surgery and none on the morning of           surgery.   x____ Stop aspirin or coumadin, or plavix stopped Eliquist last Fri.  x__ Stop Anti-inflammatories such as Advil, Aleve, Ibuprofen, Motrin, Naproxen,          Naprosyn, Goodies powders or aspirin products. Ok to take Tylenol.   _x___ Stop supplements until after surgery.    ____ Bring C-Pap to the hospital.

## 2016-07-23 NOTE — Pre-Procedure Instructions (Signed)
Component Name Value Ref Range  Vent Rate (bpm) 88   PR Interval (msec) 172   QRS Interval (msec) 84   QT Interval (msec) 350   QTc (msec) 423   Result Narrative  Normal sinus rhythm Low voltage QRS Cannot rule out Anterior infarct , age undetermined Abnormal ECG When compared with ECG of 14-Feb-2014 11:16, No significant change was found I reviewed and concur with this report. Electronically signed LO:VFIEPby:HANDE MD, VISH ([id number]) on 07/14/2016 2:58:04 PM

## 2016-07-24 LAB — URINE CULTURE: CULTURE: NO GROWTH

## 2016-07-29 MED ORDER — CEFAZOLIN SODIUM-DEXTROSE 2-4 GM/100ML-% IV SOLN
2.0000 g | Freq: Once | INTRAVENOUS | Status: AC
  Administered 2016-07-30: 2 g via INTRAVENOUS

## 2016-07-30 ENCOUNTER — Inpatient Hospital Stay: Payer: Medicare Other

## 2016-07-30 ENCOUNTER — Inpatient Hospital Stay: Payer: Medicare Other | Admitting: Certified Registered Nurse Anesthetist

## 2016-07-30 ENCOUNTER — Encounter
Admission: RE | Disposition: A | Discharge status: Skilled Nursing Facility | Payer: Self-pay | Source: Ambulatory Visit | Attending: Orthopedic Surgery

## 2016-07-30 ENCOUNTER — Encounter: Payer: Self-pay | Admitting: *Deleted

## 2016-07-30 ENCOUNTER — Inpatient Hospital Stay
Admission: RE | Admit: 2016-07-30 | Discharge: 2016-08-03 | DRG: 470 | Disposition: A | Discharge status: Skilled Nursing Facility | Payer: Medicare Other | Source: Ambulatory Visit | Attending: Orthopedic Surgery | Admitting: Orthopedic Surgery

## 2016-07-30 DIAGNOSIS — I2510 Atherosclerotic heart disease of native coronary artery without angina pectoris: Secondary | ICD-10-CM | POA: Diagnosis present

## 2016-07-30 DIAGNOSIS — Z86711 Personal history of pulmonary embolism: Secondary | ICD-10-CM | POA: Diagnosis not present

## 2016-07-30 DIAGNOSIS — Z7901 Long term (current) use of anticoagulants: Secondary | ICD-10-CM | POA: Diagnosis not present

## 2016-07-30 DIAGNOSIS — E119 Type 2 diabetes mellitus without complications: Secondary | ICD-10-CM | POA: Diagnosis present

## 2016-07-30 DIAGNOSIS — F4310 Post-traumatic stress disorder, unspecified: Secondary | ICD-10-CM | POA: Diagnosis present

## 2016-07-30 DIAGNOSIS — M1611 Unilateral primary osteoarthritis, right hip: Secondary | ICD-10-CM | POA: Diagnosis present

## 2016-07-30 DIAGNOSIS — M810 Age-related osteoporosis without current pathological fracture: Secondary | ICD-10-CM | POA: Diagnosis present

## 2016-07-30 DIAGNOSIS — Z419 Encounter for procedure for purposes other than remedying health state, unspecified: Secondary | ICD-10-CM

## 2016-07-30 DIAGNOSIS — F319 Bipolar disorder, unspecified: Secondary | ICD-10-CM | POA: Diagnosis present

## 2016-07-30 DIAGNOSIS — R262 Difficulty in walking, not elsewhere classified: Secondary | ICD-10-CM

## 2016-07-30 DIAGNOSIS — F429 Obsessive-compulsive disorder, unspecified: Secondary | ICD-10-CM | POA: Diagnosis present

## 2016-07-30 DIAGNOSIS — M6281 Muscle weakness (generalized): Secondary | ICD-10-CM

## 2016-07-30 DIAGNOSIS — G8918 Other acute postprocedural pain: Secondary | ICD-10-CM

## 2016-07-30 DIAGNOSIS — Z7984 Long term (current) use of oral hypoglycemic drugs: Secondary | ICD-10-CM | POA: Diagnosis not present

## 2016-07-30 DIAGNOSIS — Z86718 Personal history of other venous thrombosis and embolism: Secondary | ICD-10-CM | POA: Diagnosis not present

## 2016-07-30 DIAGNOSIS — M79605 Pain in left leg: Secondary | ICD-10-CM

## 2016-07-30 DIAGNOSIS — J449 Chronic obstructive pulmonary disease, unspecified: Secondary | ICD-10-CM | POA: Diagnosis present

## 2016-07-30 DIAGNOSIS — M1612 Unilateral primary osteoarthritis, left hip: Principal | ICD-10-CM | POA: Diagnosis present

## 2016-07-30 HISTORY — PX: JOINT REPLACEMENT: SHX530

## 2016-07-30 HISTORY — PX: TOTAL HIP ARTHROPLASTY: SHX124

## 2016-07-30 LAB — CBC
HCT: 36.2 % (ref 35.0–47.0)
HEMOGLOBIN: 11.6 g/dL — AB (ref 12.0–16.0)
MCH: 30.9 pg (ref 26.0–34.0)
MCHC: 32 g/dL (ref 32.0–36.0)
MCV: 96.7 fL (ref 80.0–100.0)
Platelets: 254 10*3/uL (ref 150–440)
RBC: 3.75 MIL/uL — AB (ref 3.80–5.20)
RDW: 13.2 % (ref 11.5–14.5)
WBC: 8.6 10*3/uL (ref 3.6–11.0)

## 2016-07-30 LAB — GLUCOSE, CAPILLARY
GLUCOSE-CAPILLARY: 198 mg/dL — AB (ref 65–99)
GLUCOSE-CAPILLARY: 190 mg/dL — AB (ref 65–99)
Glucose-Capillary: 154 mg/dL — ABNORMAL HIGH (ref 65–99)
Glucose-Capillary: 178 mg/dL — ABNORMAL HIGH (ref 65–99)
Glucose-Capillary: 176 mg/dL — ABNORMAL HIGH (ref 65–99)

## 2016-07-30 LAB — CREATININE, SERUM
CREATININE: 0.74 mg/dL (ref 0.44–1.00)
GFR calc non Af Amer: >60 mL/min (ref >60)

## 2016-07-30 LAB — ABO/RH: ABO/RH(D): A POS

## 2016-07-30 SURGERY — ARTHROPLASTY, HIP, TOTAL, ANTERIOR APPROACH
Anesthesia: Spinal | Site: Hip | Laterality: Left | Wound class: Clean

## 2016-07-30 MED ORDER — ACETAMINOPHEN 10 MG/ML IV SOLN
INTRAVENOUS | Status: DC | PRN
  Administered 2016-07-30: 1000 mg via INTRAVENOUS

## 2016-07-30 MED ORDER — METHOCARBAMOL 1000 MG/10ML IJ SOLN
500.0000 mg | Freq: Four times a day (QID) | INTRAVENOUS | Status: DC | PRN
  Filled 2016-07-30: qty 5

## 2016-07-30 MED ORDER — NITROGLYCERIN 0.4 MG SL SUBL: 0.4000 mg | SUBLINGUAL_TABLET | SUBLINGUAL | Status: DC | PRN

## 2016-07-30 MED ORDER — PREMIER PROTEIN SHAKE
11.0000 [oz_av] | Freq: Every day | ORAL | Status: DC
  Administered 2016-08-01 – 2016-08-03 (×3): 11 [oz_av] via ORAL

## 2016-07-30 MED ORDER — PROPOFOL 10 MG/ML IV BOLUS
INTRAVENOUS | Status: DC | PRN
  Administered 2016-07-30: 30 mg via INTRAVENOUS

## 2016-07-30 MED ORDER — HYDROCODONE-ACETAMINOPHEN 10-325 MG PO TABS
1.0000 | ORAL_TABLET | ORAL | Status: DC | PRN
  Administered 2016-07-30 – 2016-08-03 (×11): 2 via ORAL
  Filled 2016-07-30 (×11): qty 2

## 2016-07-30 MED ORDER — CLONAZEPAM 0.5 MG PO TABS
0.5000 mg | ORAL_TABLET | Freq: Two times a day (BID) | ORAL | Status: DC
  Administered 2016-07-30 – 2016-08-03 (×9): 0.5 mg via ORAL
  Filled 2016-07-30 (×9): qty 1

## 2016-07-30 MED ORDER — METOCLOPRAMIDE HCL 5 MG/ML IJ SOLN: 5.0000 mg | Freq: Three times a day (TID) | INTRAMUSCULAR | Status: DC | PRN

## 2016-07-30 MED ORDER — FAMOTIDINE 20 MG PO TABS
ORAL_TABLET | ORAL | Status: AC
  Administered 2016-07-30: 20 mg via ORAL
  Filled 2016-07-30: qty 1

## 2016-07-30 MED ORDER — DENOSUMAB 60 MG/ML ~~LOC~~ SOLN: 60.0000 mg | SUBCUTANEOUS | Status: DC

## 2016-07-30 MED ORDER — DOCUSATE SODIUM 100 MG PO CAPS
100.0000 mg | ORAL_CAPSULE | Freq: Two times a day (BID) | ORAL | Status: DC
  Administered 2016-07-30 – 2016-08-02 (×7): 100 mg via ORAL
  Filled 2016-07-30 (×7): qty 1

## 2016-07-30 MED ORDER — DULOXETINE HCL 60 MG PO CPEP
120.0000 mg | ORAL_CAPSULE | Freq: Every day | ORAL | Status: DC
  Administered 2016-07-31 – 2016-08-03 (×4): 120 mg via ORAL
  Filled 2016-07-30 (×4): qty 2

## 2016-07-30 MED ORDER — BUPIVACAINE-EPINEPHRINE 0.25% -1:200000 IJ SOLN
INTRAMUSCULAR | Status: DC | PRN
  Administered 2016-07-30: 30 mL

## 2016-07-30 MED ORDER — DIPHENHYDRAMINE HCL 25 MG PO TABS
50.0000 mg | ORAL_TABLET | Freq: Every day | ORAL | Status: DC
  Administered 2016-07-30 – 2016-08-02 (×4): 50 mg via ORAL
  Filled 2016-07-30 (×9): qty 2

## 2016-07-30 MED ORDER — GABAPENTIN 400 MG PO CAPS
1200.0000 mg | ORAL_CAPSULE | Freq: Three times a day (TID) | ORAL | Status: DC
  Administered 2016-07-30 – 2016-08-03 (×11): 1200 mg via ORAL
  Filled 2016-07-30 (×12): qty 3

## 2016-07-30 MED ORDER — ACETAMINOPHEN 10 MG/ML IV SOLN
INTRAVENOUS | Status: AC
  Filled 2016-07-30: qty 100

## 2016-07-30 MED ORDER — METOCLOPRAMIDE HCL 10 MG PO TABS
5.0000 mg | ORAL_TABLET | Freq: Three times a day (TID) | ORAL | Status: DC | PRN
Start: 2016-07-30 — End: 2016-08-03
  Administered 2016-07-30: 10 mg via ORAL
  Filled 2016-07-30: qty 1

## 2016-07-30 MED ORDER — MEPERIDINE HCL 25 MG/ML IJ SOLN: 6.2500 mg | INTRAMUSCULAR | Status: DC | PRN

## 2016-07-30 MED ORDER — BUPIVACAINE-EPINEPHRINE (PF) 0.25% -1:200000 IJ SOLN
INTRAMUSCULAR | Status: AC
  Filled 2016-07-30: qty 30

## 2016-07-30 MED ORDER — ACETAMINOPHEN 325 MG PO TABS
650.0000 mg | ORAL_TABLET | Freq: Four times a day (QID) | ORAL | Status: DC | PRN
  Administered 2016-07-30 – 2016-08-03 (×2): 650 mg via ORAL
  Filled 2016-07-30 (×2): qty 2

## 2016-07-30 MED ORDER — APIXABAN 5 MG PO TABS
5.0000 mg | ORAL_TABLET | Freq: Two times a day (BID) | ORAL | Status: DC
  Administered 2016-07-31 – 2016-08-03 (×7): 5 mg via ORAL
  Filled 2016-07-30 (×7): qty 1

## 2016-07-30 MED ORDER — INSULIN ASPART 100 UNIT/ML ~~LOC~~ SOLN
0.0000 [IU] | Freq: Three times a day (TID) | SUBCUTANEOUS | Status: DC
Start: 2016-07-30 — End: 2016-08-03
  Administered 2016-07-30 (×2): 3 [IU] via SUBCUTANEOUS
  Administered 2016-07-31: 2 [IU] via SUBCUTANEOUS
  Administered 2016-07-31: 5 [IU] via SUBCUTANEOUS
  Administered 2016-08-01: 2 [IU] via SUBCUTANEOUS
  Administered 2016-08-01: 4 [IU] via SUBCUTANEOUS
  Administered 2016-08-01: 5 [IU] via SUBCUTANEOUS
  Administered 2016-08-02 – 2016-08-03 (×4): 3 [IU] via SUBCUTANEOUS
  Filled 2016-07-30: qty 5
  Filled 2016-07-30: qty 3
  Filled 2016-07-30: qty 2
  Filled 2016-07-30: qty 5
  Filled 2016-07-30: qty 3
  Filled 2016-07-30: qty 2
  Filled 2016-07-30: qty 3
  Filled 2016-07-30: qty 5
  Filled 2016-07-30 (×2): qty 3
  Filled 2016-07-30: qty 2
  Filled 2016-07-30: qty 4

## 2016-07-30 MED ORDER — ZOLPIDEM TARTRATE 5 MG PO TABS: 5.0000 mg | ORAL_TABLET | Freq: Every evening | ORAL | Status: DC | PRN

## 2016-07-30 MED ORDER — ONDANSETRON HCL 4 MG PO TABS: 4.0000 mg | ORAL_TABLET | Freq: Four times a day (QID) | ORAL | Status: DC | PRN

## 2016-07-30 MED ORDER — MENTHOL 3 MG MT LOZG
1.0000 | LOZENGE | OROMUCOSAL | Status: DC | PRN
  Filled 2016-07-30: qty 9

## 2016-07-30 MED ORDER — ALUM & MAG HYDROXIDE-SIMETH 200-200-20 MG/5ML PO SUSP: 30.0000 mL | ORAL | Status: DC | PRN

## 2016-07-30 MED ORDER — ACETAMINOPHEN 650 MG RE SUPP: 650.0000 mg | Freq: Four times a day (QID) | RECTAL | Status: DC | PRN

## 2016-07-30 MED ORDER — BISACODYL 10 MG RE SUPP: 10.0000 mg | Freq: Every day | RECTAL | Status: DC | PRN

## 2016-07-30 MED ORDER — MELATONIN 1 MG PO TABS: 1.0000 mg | ORAL_TABLET | Freq: Every day | ORAL | Status: DC

## 2016-07-30 MED ORDER — BUPIVACAINE HCL (PF) 0.5 % IJ SOLN
INTRAMUSCULAR | Status: DC | PRN
Start: 2016-07-30 — End: 2016-07-30
  Administered 2016-07-30: 3 mL

## 2016-07-30 MED ORDER — HYDROMORPHONE HCL 1 MG/ML IJ SOLN
1.0000 mg | INTRAMUSCULAR | Status: DC | PRN
  Administered 2016-07-30 – 2016-08-03 (×10): 1 mg via INTRAVENOUS
  Filled 2016-07-30 (×11): qty 1

## 2016-07-30 MED ORDER — FENTANYL 50 MCG/HR TD PT72
75.0000 ug | MEDICATED_PATCH | TRANSDERMAL | Status: DC
  Administered 2016-08-02: 75 ug via TRANSDERMAL
  Filled 2016-07-30: qty 1

## 2016-07-30 MED ORDER — GINKGO BILOBA 60 MG PO CAPS: 120.0000 mg | ORAL_CAPSULE | Freq: Every day | ORAL | Status: DC

## 2016-07-30 MED ORDER — CALCIUM CITRATE 950 (200 CA) MG PO TABS
2400.0000 mg | ORAL_TABLET | Freq: Every day | ORAL | Status: DC
  Administered 2016-07-30 – 2016-08-02 (×4): 2375 mg via ORAL
  Filled 2016-07-30 (×6): qty 3

## 2016-07-30 MED ORDER — TRAZODONE HCL 100 MG PO TABS
100.0000 mg | ORAL_TABLET | Freq: Every day | ORAL | Status: DC
  Administered 2016-07-30 – 2016-08-02 (×4): 100 mg via ORAL
  Filled 2016-07-30 (×4): qty 1

## 2016-07-30 MED ORDER — MAGNESIUM HYDROXIDE 400 MG/5ML PO SUSP
30.0000 mL | Freq: Every day | ORAL | Status: DC | PRN
  Administered 2016-08-01: 30 mL via ORAL
  Filled 2016-07-30: qty 30

## 2016-07-30 MED ORDER — OXYCODONE HCL 5 MG PO TABS
10.0000 mg | ORAL_TABLET | Freq: Three times a day (TID) | ORAL | Status: DC
  Administered 2016-07-30 – 2016-08-03 (×12): 10 mg via ORAL
  Filled 2016-07-30 (×14): qty 2

## 2016-07-30 MED ORDER — CEFAZOLIN SODIUM-DEXTROSE 2-4 GM/100ML-% IV SOLN
2.0000 g | Freq: Four times a day (QID) | INTRAVENOUS | Status: AC
  Administered 2016-07-30 – 2016-07-31 (×3): 2 g via INTRAVENOUS
  Filled 2016-07-30 (×3): qty 100

## 2016-07-30 MED ORDER — FAMOTIDINE 20 MG PO TABS
20.0000 mg | ORAL_TABLET | Freq: Once | ORAL | Status: AC
  Administered 2016-07-30: 20 mg via ORAL

## 2016-07-30 MED ORDER — DEXAMETHASONE SODIUM PHOSPHATE 4 MG/ML IJ SOLN
INTRAMUSCULAR | Status: DC | PRN
  Administered 2016-07-30: 4 mg via INTRAVENOUS

## 2016-07-30 MED ORDER — SODIUM CHLORIDE 0.9 % IV SOLN
INTRAVENOUS | Status: DC
  Administered 2016-07-30 – 2016-07-31 (×2): via INTRAVENOUS

## 2016-07-30 MED ORDER — FLUTICASONE PROPIONATE 50 MCG/ACT NA SUSP
1.0000 | Freq: Two times a day (BID) | NASAL | Status: DC
Start: 2016-07-30 — End: 2016-08-03
  Administered 2016-07-30 – 2016-08-03 (×9): 1 via NASAL
  Filled 2016-07-30: qty 16

## 2016-07-30 MED ORDER — MIDAZOLAM HCL 5 MG/5ML IJ SOLN
INTRAMUSCULAR | Status: DC | PRN
  Administered 2016-07-30 (×2): 1 mg via INTRAVENOUS

## 2016-07-30 MED ORDER — VITAMIN D 1000 UNITS PO TABS
5000.0000 [IU] | ORAL_TABLET | Freq: Every day | ORAL | Status: DC
  Administered 2016-07-31 – 2016-08-02 (×3): 5000 [IU] via ORAL
  Filled 2016-07-30 (×3): qty 5

## 2016-07-30 MED ORDER — OXYCODONE HCL 5 MG PO TABS: 5.0000 mg | ORAL_TABLET | Freq: Once | ORAL | Status: DC | PRN

## 2016-07-30 MED ORDER — METHOCARBAMOL 500 MG PO TABS
500.0000 mg | ORAL_TABLET | Freq: Four times a day (QID) | ORAL | Status: DC | PRN
  Administered 2016-07-30 – 2016-08-01 (×4): 500 mg via ORAL
  Filled 2016-07-30 (×4): qty 1

## 2016-07-30 MED ORDER — MAGNESIUM CITRATE PO SOLN
1.0000 | Freq: Once | ORAL | Status: DC | PRN
  Filled 2016-07-30: qty 296

## 2016-07-30 MED ORDER — LIDOCAINE HCL (CARDIAC) 20 MG/ML IV SOLN
INTRAVENOUS | Status: DC | PRN
Start: 2016-07-30 — End: 2016-07-30
  Administered 2016-07-30: 30 mg via INTRAVENOUS

## 2016-07-30 MED ORDER — VITAMIN E 180 MG (400 UNIT) PO CAPS
2000.0000 [IU] | ORAL_CAPSULE | Freq: Every day | ORAL | Status: DC
  Administered 2016-07-30 – 2016-08-02 (×4): 2000 [IU] via ORAL
  Filled 2016-07-30 (×5): qty 5

## 2016-07-30 MED ORDER — FENTANYL CITRATE (PF) 100 MCG/2ML IJ SOLN
INTRAMUSCULAR | Status: DC | PRN
  Administered 2016-07-30 (×2): 25 ug via INTRAVENOUS
  Administered 2016-07-30: 50 ug via INTRAVENOUS

## 2016-07-30 MED ORDER — PROMETHAZINE HCL 25 MG/ML IJ SOLN: 6.2500 mg | INTRAMUSCULAR | Status: DC | PRN

## 2016-07-30 MED ORDER — MAGNESIUM OXIDE 400 (241.3 MG) MG PO TABS
400.0000 mg | ORAL_TABLET | Freq: Every day | ORAL | Status: DC
  Administered 2016-07-30 – 2016-08-02 (×4): 400 mg via ORAL
  Filled 2016-07-30 (×5): qty 1

## 2016-07-30 MED ORDER — PHENOL 1.4 % MT LIQD
1.0000 | OROMUCOSAL | Status: DC | PRN
  Filled 2016-07-30: qty 177

## 2016-07-30 MED ORDER — SODIUM CHLORIDE 0.9 % IV SOLN
INTRAVENOUS | Status: DC
  Administered 2016-07-30: 07:00:00 via INTRAVENOUS

## 2016-07-30 MED ORDER — ONDANSETRON HCL 4 MG/2ML IJ SOLN
INTRAMUSCULAR | Status: DC | PRN
  Administered 2016-07-30: 4 mg via INTRAVENOUS

## 2016-07-30 MED ORDER — ONDANSETRON HCL 4 MG/2ML IJ SOLN: 4.0000 mg | Freq: Four times a day (QID) | INTRAMUSCULAR | Status: DC | PRN

## 2016-07-30 MED ORDER — NEOMYCIN-POLYMYXIN B GU 40-200000 IR SOLN
Status: DC | PRN
  Administered 2016-07-30: 4 mL

## 2016-07-30 MED ORDER — PROPOFOL 500 MG/50ML IV EMUL
INTRAVENOUS | Status: DC | PRN
  Administered 2016-07-30: 75 ug/kg/min via INTRAVENOUS

## 2016-07-30 MED ORDER — METFORMIN HCL 500 MG PO TABS
1000.0000 mg | ORAL_TABLET | Freq: Every day | ORAL | Status: DC
  Administered 2016-07-30 – 2016-08-03 (×5): 1000 mg via ORAL
  Filled 2016-07-30 (×5): qty 2

## 2016-07-30 MED ORDER — FENTANYL CITRATE (PF) 100 MCG/2ML IJ SOLN: 25.0000 ug | INTRAMUSCULAR | Status: DC | PRN

## 2016-07-30 MED ORDER — OXYCODONE HCL 5 MG/5ML PO SOLN: 5.0000 mg | Freq: Once | ORAL | Status: DC | PRN

## 2016-07-30 MED ORDER — DIVALPROEX SODIUM 500 MG PO DR TAB
1500.0000 mg | DELAYED_RELEASE_TABLET | Freq: Every day | ORAL | Status: DC
  Administered 2016-07-31: 1500 mg via ORAL
  Filled 2016-07-30: qty 3

## 2016-07-30 MED ORDER — ZINC SULFATE 220 (50 ZN) MG PO CAPS
220.0000 mg | ORAL_CAPSULE | Freq: Every day | ORAL | Status: DC
  Administered 2016-07-30 – 2016-08-02 (×4): 220 mg via ORAL
  Filled 2016-07-30 (×4): qty 1

## 2016-07-30 MED ORDER — VITAMIN B-12 1000 MCG PO TABS
1000.0000 ug | ORAL_TABLET | Freq: Every day | ORAL | Status: DC
  Administered 2016-07-30 – 2016-08-02 (×4): 1000 ug via ORAL
  Filled 2016-07-30 (×4): qty 1

## 2016-07-30 MED ORDER — KETAMINE HCL 50 MG/ML IJ SOLN
INTRAMUSCULAR | Status: DC | PRN
  Administered 2016-07-30: 50 mg via INTRAMUSCULAR

## 2016-07-30 MED ORDER — BACLOFEN 10 MG PO TABS
5.0000 mg | ORAL_TABLET | Freq: Two times a day (BID) | ORAL | Status: DC
  Administered 2016-07-30 – 2016-08-03 (×8): 5 mg via ORAL
  Filled 2016-07-30: qty 0.5
  Filled 2016-07-30 (×8): qty 1

## 2016-07-30 MED ORDER — FERROUS SULFATE 325 (65 FE) MG PO TABS
650.0000 mg | ORAL_TABLET | Freq: Every day | ORAL | Status: DC
  Administered 2016-07-30 – 2016-08-03 (×3): 650 mg via ORAL
  Filled 2016-07-30 (×4): qty 2

## 2016-07-30 SURGICAL SUPPLY — 48 items
BLADE SAW SAG 18.5X105 (BLADE) ×2 IMPLANT
BNDG COHESIVE 6X5 TAN STRL LF (GAUZE/BANDAGES/DRESSINGS) ×4 IMPLANT
CANISTER SUCT 1200ML W/VALVE (MISCELLANEOUS) ×2 IMPLANT
CAPT HIP TOTAL 3 ×2 IMPLANT
CATH FOL LEG HOLDER (MISCELLANEOUS) ×2 IMPLANT
CATH TRAY METER 16FR LF (MISCELLANEOUS) ×2 IMPLANT
CHLORAPREP W/TINT 26ML (MISCELLANEOUS) ×2 IMPLANT
DRAPE C-ARM XRAY 36X54 (DRAPES) ×2 IMPLANT
DRAPE INCISE IOBAN 66X60 STRL (DRAPES) IMPLANT
DRAPE POUCH INSTRU U-SHP 10X18 (DRAPES) ×2 IMPLANT
DRAPE SHEET LG 3/4 BI-LAMINATE (DRAPES) ×6 IMPLANT
DRAPE STERI IOBAN 125X83 (DRAPES) ×2 IMPLANT
DRAPE TABLE BACK 80X90 (DRAPES) ×2 IMPLANT
DRSG OPSITE POSTOP 4X8 (GAUZE/BANDAGES/DRESSINGS) ×4 IMPLANT
ELECT BLADE 6.5 EXT (BLADE) ×2 IMPLANT
ELECT REM PT RETURN 9FT ADLT (ELECTROSURGICAL) ×2
ELECTRODE REM PT RTRN 9FT ADLT (ELECTROSURGICAL) ×1 IMPLANT
GAUZE SPONGE 4X4 12PLY STRL (GAUZE/BANDAGES/DRESSINGS) ×2 IMPLANT
GLOVE BIOGEL PI IND STRL 9 (GLOVE) ×1 IMPLANT
GLOVE BIOGEL PI INDICATOR 9 (GLOVE) ×1
GLOVE SURG SYN 9.0  PF PI (GLOVE) ×2
GLOVE SURG SYN 9.0 PF PI (GLOVE) ×2 IMPLANT
GOWN SRG 2XL LVL 4 RGLN SLV (GOWNS) ×1 IMPLANT
GOWN STRL NON-REIN 2XL LVL4 (GOWNS) ×1
GOWN STRL REUS W/ TWL LRG LVL3 (GOWN DISPOSABLE) ×1 IMPLANT
GOWN STRL REUS W/TWL LRG LVL3 (GOWN DISPOSABLE) ×1
HEMOVAC 400CC 10FR (MISCELLANEOUS) IMPLANT
HOOD PEEL AWAY FLYTE STAYCOOL (MISCELLANEOUS) ×2 IMPLANT
MAT BLUE FLOOR 46X72 FLO (MISCELLANEOUS) ×2 IMPLANT
NDL SAFETY 18GX1.5 (NEEDLE) ×2 IMPLANT
NEEDLE SPNL 18GX3.5 QUINCKE PK (NEEDLE) ×2 IMPLANT
NS IRRIG 1000ML POUR BTL (IV SOLUTION) ×2 IMPLANT
PACK HIP COMPR (MISCELLANEOUS) ×2 IMPLANT
SOL PREP PVP 2OZ (MISCELLANEOUS) ×2
SOLUTION PREP PVP 2OZ (MISCELLANEOUS) ×1 IMPLANT
STAPLER SKIN PROX 35W (STAPLE) ×2 IMPLANT
STRAP SAFETY BODY (MISCELLANEOUS) ×2 IMPLANT
SUT DVC 2 QUILL PDO  T11 36X36 (SUTURE) ×1
SUT DVC 2 QUILL PDO T11 36X36 (SUTURE) ×1 IMPLANT
SUT DVC QUILL MONODERM 30X30 (SUTURE) ×2 IMPLANT
SUT SILK 0 (SUTURE) ×1
SUT SILK 0 30XBRD TIE 6 (SUTURE) ×1 IMPLANT
SUT VIC AB 1 CT1 36 (SUTURE) ×2 IMPLANT
SYR 20CC LL (SYRINGE) ×2 IMPLANT
SYR 30ML LL (SYRINGE) ×2 IMPLANT
TAPE MICROFOAM 4IN (TAPE) ×2 IMPLANT
TOWEL OR 17X26 4PK STRL BLUE (TOWEL DISPOSABLE) ×2 IMPLANT
TUBE KAMVAC SUCTION (TUBING) IMPLANT

## 2016-07-30 NOTE — Anesthesia Procedure Notes (Signed)
Spinal  Patient location during procedure: OR Start time: 07/30/2016 7:20 AM End time: 07/30/2016 7:32 AM Staffing Anesthesiologist: Randa Lynn, AMY Resident/CRNA: Johnna Acosta Performed: resident/CRNA  Preanesthetic Checklist Completed: patient identified, site marked, surgical consent, pre-op evaluation, timeout performed, IV checked, risks and benefits discussed and monitors and equipment checked Spinal Block Patient position: sitting Prep: ChloraPrep Patient monitoring: heart rate, continuous pulse ox, blood pressure and cardiac monitor Approach: midline Location: L4-5 Injection technique: single-shot Needle Needle type: Whitacre and Introducer  Needle gauge: 24 G Needle length: 9 cm Additional Notes Negative paresthesia. Negative blood return. Positive free-flowing CSF. Expiration date of kit checked and confirmed. Patient tolerated procedure well, without complications.

## 2016-07-30 NOTE — H&P (Signed)
Reviewed paper H+P, will be scanned into chart. No changes noted.  

## 2016-07-30 NOTE — Anesthesia Preprocedure Evaluation (Signed)
Anesthesia Evaluation  Patient identified by MRN, date of birth, ID band Patient awake    Reviewed: Allergy & Precautions, NPO status , Patient's Chart, lab work & pertinent test results  History of Anesthesia Complications Negative for: history of anesthetic complications  Airway Mallampati: II  TM Distance: >3 FB Neck ROM: Full    Dental  (+) Poor Dentition, Chipped, Missing   Pulmonary shortness of breath, neg sleep apnea, COPD,    breath sounds clear to auscultation- rhonchi (-) wheezing      Cardiovascular Exercise Tolerance: Good (-) hypertension(-) angina(-) Past MI and (-) Cardiac Stents  Rhythm:Regular Rate:Normal - Systolic murmurs and - Diastolic murmurs    Neuro/Psych  Headaches, Anxiety Bipolar Disorder    GI/Hepatic negative GI ROS, Neg liver ROS,   Endo/Other  diabetes, Type 2, Oral Hypoglycemic Agents  Renal/GU negative Renal ROS     Musculoskeletal  (+) Arthritis ,   Abdominal (+) + obese,   Peds  Hematology Hx of DVT 3 years ago, had IVC filter placed on 07/21/16, has been off eliquis since then   Anesthesia Other Findings Past Medical History: No date: Anxiety No date: Arthritis     Comment: degenerative artgritis  osteoarthritis No date: Bipolar disorder (HCC) No date: COPD (chronic obstructive pulmonary disease) (* No date: Coronary artery disease No date: DDD (degenerative disc disease), cervical No date: Diabetes mellitus without complication (HCC) No date: Dyspnea No date: Family history of adverse reaction to anesthes*     Comment: father blood clot after surgery died No date: Headache     Comment: history of migraines No date: History of pulmonary embolus (PE) No date: Lower extremity deep venous thrombosis (HCC) No date: Lower extremity edema No date: Multiple closed fractures involving multiple r* No date: Night terrors No date: OCD (obsessive compulsive disorder) No date:  Osteoporosis No date: PTSD (post-traumatic stress disorder)   Reproductive/Obstetrics                            Anesthesia Physical Anesthesia Plan  ASA: III  Anesthesia Plan: Spinal   Post-op Pain Management:    Induction:   Airway Management Planned: Natural Airway  Additional Equipment:   Intra-op Plan:   Post-operative Plan:   Informed Consent: I have reviewed the patients History and Physical, chart, labs and discussed the procedure including the risks, benefits and alternatives for the proposed anesthesia with the patient or authorized representative who has indicated his/her understanding and acceptance.   Dental advisory given  Plan Discussed with: CRNA and Anesthesiologist  Anesthesia Plan Comments:         Lab Results  Component Value Date   WBC 7.3 07/23/2016   HGB 13.4 07/23/2016   HCT 41.1 07/23/2016   MCV 96.3 07/23/2016   PLT 215 07/23/2016    Anesthesia Quick Evaluation

## 2016-07-30 NOTE — Anesthesia Procedure Notes (Signed)
Date/Time: 07/30/2016 7:35 AM Performed by: Ginger CarneMICHELET, Deserai Cansler Pre-anesthesia Checklist: Patient identified, Emergency Drugs available, Suction available, Patient being monitored and Timeout performed Patient Re-evaluated:Patient Re-evaluated prior to inductionOxygen Delivery Method: Simple face mask

## 2016-07-30 NOTE — Op Note (Signed)
07/30/2016  9:34 AM  PATIENT:  Amy Sweeney  65 y.o. female  PRE-OPERATIVE DIAGNOSIS:  PRIMARY OSTEOARTHRITIS left hip   POST-OPERATIVE DIAGNOSIS:  PRIMARY OSTEOARTHRITIS left hip  PROCEDURE:  Procedure(s): TOTAL HIP ARTHROPLASTY ANTERIOR APPROACH (Left)  SURGEON: Leitha SchullerMichael J Constance Whittle, MD  ASSISTANTS: None  ANESTHESIA:   spinal  EBL:  Total I/O In: 800 [I.V.:800] Out: 800 [Urine:200; Blood:600]  BLOOD ADMINISTERED:none  DRAINS: (2) Hemovact drain(s) in the Subcutaneous with  Suction Open   LOCAL MEDICATIONS USED:  MARCAINE     SPECIMEN:  Source of Specimen:  Left femoral head  DISPOSITION OF SPECIMEN:  PATHOLOGY  COUNTS:  YES  TOURNIQUET:  * No tourniquets in log *  IMPLANTS: Medacta AMIS 4 standard stem with 56 mm Mpact DM cup with liner and M 28 mm head  DICTATION: .Dragon Dictation   The patient was brought to the operating room and after spinal anesthesia was obtained patient was placed on the operative table with the ipsilateral foot into the Medacta attachment, contralateral leg on a well-padded table. C-arm was brought in and preop template x-ray taken. After prepping and draping in usual sterile fashion appropriate patient identification and timeout procedures were completed. Anterior approach to the hip was obtained and centered over the greater trochanter and TFL muscle. The subcutaneous tissue was incised hemostasis being achieved by electrocautery. TFL fascia was incised and the muscle retracted laterally deep retractor placed. The lateral femoral circumflex vessels were identified and ligated. The anterior capsule was exposed and a capsulotomy performed. The neck was identified and a femoral neck cut carried out with a saw. The head was removed without difficulty and showed sclerotic femoral head and acetabulum. Reaming was carried out to 54 mm and a 56 mm cup trial gave appropriate tightness to the acetabular component a 56 DM cup was impacted into position. The  leg was then externally rotated and ischiofemoral and pubofemoral releases carried out. The femur was sequentially broached to a size 4, size 4 standard trials were placed and the final components chosen. The 4 standard stem was inserted along with a M 28 mm head and 56 mm liner. The hip was reduced and was stable the wound was thoroughly irrigated. The deep fascia was closed using a heavy Quill after infiltration of 30 cc of quarter percent Sensorcaine with epinephrine. Subcutaneous drains were then inserted. 3-0 V-loc subcutaneous closure with with skin staples, Provena wound VAC over skin staples  PLAN OF CARE: Admit to inpatient

## 2016-07-30 NOTE — Transfer of Care (Signed)
Immediate Anesthesia Transfer of Care Note  Patient: Amy Sweeney  Procedure(s) Performed: Procedure(s): TOTAL HIP ARTHROPLASTY ANTERIOR APPROACH (Left)  Patient Location: PACU  Anesthesia Type:Spinal  Level of Consciousness: sedated  Airway & Oxygen Therapy: Patient Spontanous Breathing and Patient connected to face mask oxygen  Post-op Assessment: Report given to RN and Post -op Vital signs reviewed and stable  Post vital signs: Reviewed and stable  Last Vitals:  Vitals:   07/30/16 0636  BP: (!) 142/75  Pulse: 92  Resp: 16  Temp: 36.3 C    Last Pain:  Vitals:   07/30/16 0636  TempSrc: Tympanic  PainSc: 8          Complications: No apparent anesthesia complications

## 2016-07-30 NOTE — Evaluation (Signed)
Physical Therapy Evaluation Patient Details Name: Amy GumRebecca G Findlay MRN: [id number] DOB: 1951-06-30 Today's Date: 07/30/2016   History of Present Illness  Pt admitted for L THR. Pt with history of R TKA and has been limited in mobiity since that time.  Clinical Impression  Evaluation re-attempted. Pt is a pleasant 65 year old female who was admitted for L anterior THR. Pt educated on WBing status. Pt performs bed mobility with min assist, transfers with cga, and ambulation with cga and SW. Pt demonstrates deficits with pain/strength/balance/endurance. Would benefit from skilled PT to address above deficits and promote optimal return to PLOF. Pt with poor physical baseline level and is not too far off baseline as she only walks minimal distances and primarily uses WC. Unable to progress further ambulation/therex this date secondary to pain. Will continue to monitor. Pt has strong family support in home.      Follow Up Recommendations Home health PT;SNF (pending futher assessment with mobility-leaning towards HHPT)    Equipment Recommendations  3in1 (PT)    Recommendations for Other Services       Precautions / Restrictions Precautions Precautions: Fall;Anterior Hip Precaution Booklet Issued: No Restrictions Weight Bearing Restrictions: Yes LLE Weight Bearing: Weight bearing as tolerated      Mobility  Bed Mobility Overal bed mobility: Needs Assistance Bed Mobility: Sit to Supine       Sit to supine: Min assist   General bed mobility comments: assist for bringing L LE onto bed and placement on pillow. Pt demonstrates safe technique with adjusting in bed including rolling to either side for linen placement  Transfers Overall transfer level: Needs assistance Equipment used: Standard walker Transfers: Sit to/from Stand Sit to Stand: Min guard         General transfer comment: safe technique performed with pt pushing from seated surface. Once standing, pt able to stand  with safe technique and WB through L LE.  Ambulation/Gait Ambulation/Gait assistance: Min guard Ambulation Distance (Feet): 2 Feet Assistive device: Standard walker Gait Pattern/deviations: Step-to pattern     General Gait Details: ambulated from recliner to bed; only able to tolerate sitting for 20 mins. Slow step to gait pattern performed.  Stairs            Wheelchair Mobility    Modified Rankin (Stroke Patients Only)       Balance Overall balance assessment: Needs assistance Sitting-balance support: Feet supported;Bilateral upper extremity supported Sitting balance-Leahy Scale: Good     Standing balance support: Bilateral upper extremity supported Standing balance-Leahy Scale: Good                               Pertinent Vitals/Pain Pain Assessment: Faces Faces Pain Scale: Hurts even more Pain Location: L lower stomach and hip area Pain Descriptors / Indicators: Aching;Operative site guarding Pain Intervention(s): Limited activity within patient's tolerance    Home Living Family/patient expects to be discharged to:: Private residence Living Arrangements: Spouse/significant other Available Help at Discharge: Family;Available 24 hours/day Type of Home: House Home Access: Ramped entrance     Home Layout: One level Home Equipment: Walker - standard;Wheelchair - manual (has hospital bed)      Prior Function Level of Independence: Independent with assistive device(s)         Comments: was able to walk 3-4 minutes with AD (mainly SW) secondary to back pain, however [facility] for longer distances.     Hand Dominance  Extremity/Trunk Assessment   Upper Extremity Assessment: Overall WFL for tasks assessed           Lower Extremity Assessment: Generalized weakness (R LE grossly 4/5 L LE grossly 3/5)         Communication   Communication: No difficulties  Cognition Arousal/Alertness: Awake/alert Behavior  During Therapy: WFL for tasks assessed/performed;Anxious Overall Cognitive Status: Within Functional Limits for tasks assessed                      General Comments      Exercises     Assessment/Plan    PT Assessment Patient needs continued PT services  PT Problem List Decreased strength;Decreased range of motion;Decreased activity tolerance;Decreased balance;Decreased mobility;Decreased knowledge of use of DME;Decreased safety awareness;Pain          PT Treatment Interventions DME instruction;Gait training;Therapeutic activities;Therapeutic exercise    PT Goals (Current goals can be found in the Care Plan section)  Acute Rehab PT Goals Patient Stated Goal: to get stronger PT Goal Formulation: With patient Time For Goal Achievement: 08/13/16 Potential to Achieve Goals: Good    Frequency BID   Barriers to discharge        Co-evaluation               End of Session Equipment Utilized During Treatment: Gait belt;Oxygen Activity Tolerance: Patient limited by pain Patient left: in bed;with bed alarm set Nurse Communication: Mobility status;Weight bearing status         Time: [id number] PT Time Calculation (min) (ACUTE ONLY): 21 min   Charges:   PT Evaluation $PT Eval Moderate Complexity: 1 Procedure     PT G Codes:        Loredana Medellin 07/30/2016, 5:12 PM Elizabeth PalauStephanie Layan Zalenski, PT, DPT [phone]

## 2016-07-30 NOTE — Progress Notes (Signed)
PT Cancellation Note  Patient Details Name: Eliseo GumRebecca G Sterkel MRN: [id number] DOB: [birth year]   Cancelled Treatment:    Reason Eval/Treat Not Completed: Other (comment). Consult received and eval attempted. Upon removing linen, pt with bloody drainage soaking bed. RN called for assistance. Unable to further complete evaluation at this time.   Maryln Eastham 07/30/2016, 3:40 PM  Elizabeth PalauStephanie Coti Burd, PT, DPT [phone]

## 2016-07-30 NOTE — NC FL2 (Signed)
[place] MEDICAID FL2 LEVEL OF CARE SCREENING TOOL     IDENTIFICATION  Patient Name: Amy Sweeney Birthdate: 02-28-1951 Sex: female Admission Date (Current Location): 07/30/2016  [place] and [place] Number:  Chiropodist and Address:  [facility], [address], [place], [place] [address]      Provider Number: [id number]  Attending Physician Name and Address:  Kennedy Bucker, MD  Relative Name and Phone Number:       Current Level of Care: Hospital Recommended Level of Care: Skilled Nursing Facility Prior Approval Number:    Date Approved/Denied:   PASRR Number:  [id number])  Discharge Plan: SNF    Current Diagnoses: Patient Active Problem List   Diagnosis Date Noted  . Primary localized osteoarthritis of left hip 07/30/2016  . History of DVT (deep vein thrombosis) 07/16/2016  . Traumatic arthritis of knee 05/30/2015    Orientation RESPIRATION BLADDER Height & Weight     Self, Time, Situation, Place  O2 (Nasal Cannula 2L/min) External catheter Weight:   Height:     BEHAVIORAL SYMPTOMS/MOOD NEUROLOGICAL BOWEL NUTRITION STATUS   (None)  (None.) Continent Diet (Diet: Carb Modified)  AMBULATORY STATUS COMMUNICATION OF NEEDS Skin   Extensive Assist Verbally Surgical wounds (Incision: Left Hip)                       Personal Care Assistance Level of Assistance  Bathing, Feeding, Dressing Bathing Assistance: Limited assistance Feeding assistance: Independent Dressing Assistance: Limited assistance     Functional Limitations Info  Sight, Hearing, Speech Sight Info: Adequate Hearing Info: Adequate Speech Info: Adequate    SPECIAL CARE FACTORS FREQUENCY  PT (By licensed PT), OT (By licensed OT)     PT Frequency:  (5) OT Frequency:  (5)            Contractures      Additional Factors Info  Code Status, Allergies, Insulin Sliding Scale Code Status Info:  (Full Code) Allergies Info:  (Bee Venom, Morphine  And Related, Adhesive Tape, Celebrex Celecoxib, Erythromycin, Tegaderm Ag Mesh Silver, Grape Seed, Levaquin Levofloxacin In D5w)   Insulin Sliding Scale Info:  (NovoLog)       Current Medications (07/30/2016):  This is the current hospital active medication list Current Facility-Administered Medications  Medication Dose Route Frequency Provider Last Rate Last Dose  . 0.9 %  sodium chloride infusion   Intravenous Continuous Kennedy Bucker, MD 100 mL/hr at 07/30/16 1302    . acetaminophen (TYLENOL) tablet 650 mg  650 mg Oral Q6H PRN Kennedy Bucker, MD       Or  . acetaminophen (TYLENOL) suppository 650 mg  650 mg Rectal Q6H PRN Kennedy Bucker, MD      . alum & mag hydroxide-simeth (MAALOX/MYLANTA) 200-200-20 MG/5ML suspension 30 mL  30 mL Oral Q4H PRN Kennedy Bucker, MD      . Melene Muller ON 07/31/2016] apixaban (ELIQUIS) tablet 5 mg  5 mg Oral Q12H Kennedy Bucker, MD      . baclofen (LIORESAL) tablet 5 mg  5 mg Oral BID Kennedy Bucker, MD      . bisacodyl (DULCOLAX) suppository 10 mg  10 mg Rectal Daily PRN Kennedy Bucker, MD      . calcium citrate (CALCITRATE - dosed in mg elemental calcium) tablet 2,375 mg  2,375 mg Oral Q1400 Kennedy Bucker, MD      . ceFAZolin (ANCEF) IVPB 2g/100 mL premix  2 g Intravenous Q6H Kennedy Bucker, MD      . [  START ON 07/31/2016] cholecalciferol (VITAMIN D) tablet 5,000 Units  5,000 Units Oral Q1400 Kennedy BuckerMichael Menz, MD      . clonazePAM Scarlette Calico(KLONOPIN) tablet 0.5 mg  0.5 mg Oral BID Kennedy BuckerMichael Menz, MD   0.5 mg at 07/30/16 1301  . [START ON 12/28/2016] denosumab (PROLIA) injection 60 mg  60 mg Subcutaneous Q6 months Kennedy BuckerMichael Menz, MD      . diphenhydrAMINE (BENADRYL) tablet 50 mg  50 mg Oral QHS Kennedy BuckerMichael Menz, MD      . Melene Muller[START ON 07/31/2016] divalproex (DEPAKOTE) DR tablet 1,500 mg  1,500 mg Oral Daily Kennedy BuckerMichael Menz, MD      . docusate sodium (COLACE) capsule 100 mg  100 mg Oral BID Kennedy BuckerMichael Menz, MD   100 mg at 07/30/16 1301  . [START ON 07/31/2016] DULoxetine (CYMBALTA) DR capsule 120 mg  120 mg  Oral Daily Kennedy BuckerMichael Menz, MD      . Melene Muller[START ON 08/02/2016] fentaNYL (DURAGESIC - dosed mcg/hr) patch 75 mcg  75 mcg Transdermal Q72H Kennedy BuckerMichael Menz, MD      . ferrous sulfate tablet 650 mg  650 mg Oral Q breakfast Kennedy BuckerMichael Menz, MD   650 mg at 07/30/16 1301  . fluticasone (FLONASE) 50 MCG/ACT nasal spray 1 spray  1 spray Each Nare BID Kennedy BuckerMichael Menz, MD      . gabapentin (NEURONTIN) capsule 1,200 mg  1,200 mg Oral TID Kennedy BuckerMichael Menz, MD      . HYDROcodone-acetaminophen [facility](NORCO) 10-325 MG per tablet 1-2 tablet  1-2 tablet Oral Q4H PRN Kennedy BuckerMichael Menz, MD   2 tablet at 07/30/16 1301  . HYDROmorphone (DILAUDID) injection 1 mg  1 mg Intravenous Q2H PRN Kennedy BuckerMichael Menz, MD      . insulin aspart (novoLOG) injection 0-15 Units  0-15 Units Subcutaneous TID WC Kennedy BuckerMichael Menz, MD   3 Units at 07/30/16 1300  . magnesium citrate solution 1 Bottle  1 Bottle Oral Once PRN Kennedy BuckerMichael Menz, MD      . magnesium hydroxide (MILK OF MAGNESIA) suspension 30 mL  30 mL Oral Daily PRN Kennedy BuckerMichael Menz, MD      . magnesium oxide (MAG-OX) tablet 400 mg  400 mg Oral Q1400 Kennedy BuckerMichael Menz, MD   400 mg at 07/30/16 1301  . menthol-cetylpyridinium (CEPACOL) lozenge 3 mg  1 lozenge Oral PRN Kennedy BuckerMichael Menz, MD       Or  . phenol [facility](CHLORASEPTIC) mouth spray 1 spray  1 spray Mouth/Throat PRN Kennedy BuckerMichael Menz, MD      . metFORMIN (GLUCOPHAGE) tablet 1,000 mg  1,000 mg Oral Q breakfast Kennedy BuckerMichael Menz, MD   1,000 mg at 07/30/16 1301  . methocarbamol (ROBAXIN) tablet 500 mg  500 mg Oral Q6H PRN Kennedy BuckerMichael Menz, MD       Or  . methocarbamol (ROBAXIN) 500 mg in dextrose 5 % 50 mL IVPB  500 mg Intravenous Q6H PRN Kennedy BuckerMichael Menz, MD      . metoCLOPramide (REGLAN) tablet 5-10 mg  5-10 mg Oral Q8H PRN Kennedy BuckerMichael Menz, MD   10 mg at 07/30/16 1301   Or  . metoCLOPramide (REGLAN) injection 5-10 mg  5-10 mg Intravenous Q8H PRN Kennedy BuckerMichael Menz, MD      . nitroGLYCERIN (NITROSTAT) SL tablet 0.4 mg  0.4 mg Sublingual Q5 min PRN Kennedy BuckerMichael Menz, MD      . ondansetron [facility](ZOFRAN) tablet 4 mg  4 mg  Oral Q6H PRN Kennedy BuckerMichael Menz, MD       Or  . ondansetron [facility](ZOFRAN) injection 4 mg  4 mg Intravenous Q6H PRN  Kennedy BuckerMichael Menz, MD      . oxyCODONE (Oxy IR/ROXICODONE) immediate release tablet 10 mg  10 mg Oral TID Kennedy BuckerMichael Menz, MD      . Melene Muller[START ON 07/31/2016] protein supplement (PREMIER PROTEIN) liquid  11 oz Oral Q breakfast Kennedy BuckerMichael Menz, MD      . traZODone (DESYREL) tablet 100 mg  100 mg Oral QHS Kennedy BuckerMichael Menz, MD      . vitamin B-12 (CYANOCOBALAMIN) tablet 1,000 mcg  1,000 mcg Oral Q1400 Kennedy BuckerMichael Menz, MD   1,000 mcg at 07/30/16 1301  . vitamin E capsule 2,000 Units  2,000 Units Oral Q1400 Kennedy BuckerMichael Menz, MD   2,000 Units at 07/30/16 1301  . zinc sulfate capsule 220 mg  220 mg Oral Q1400 Kennedy BuckerMichael Menz, MD   220 mg at 07/30/16 1301  . zolpidem (AMBIEN) tablet 5 mg  5 mg Oral QHS PRN Kennedy BuckerMichael Menz, MD         Discharge Medications: Please see discharge summary for a list of discharge medications.  Relevant Imaging Results:  Relevant Lab Results:   Additional Information  (SSN: [id number])  Ralene BatheMackenzie Chara Marquard, Student-Social Work

## 2016-07-31 LAB — BASIC METABOLIC PANEL
ANION GAP: 5 (ref 5–15)
BUN: 9 mg/dL (ref 6–20)
CALCIUM: 8.2 mg/dL — AB (ref 8.9–10.3)
CHLORIDE: 99 mmol/L — AB (ref 101–111)
CO2: 33 mmol/L — AB (ref 22–32)
Creatinine, Ser: 0.61 mg/dL (ref 0.44–1.00)
GFR calc non Af Amer: >60 mL/min (ref >60)
GLUCOSE: 147 mg/dL — AB (ref 65–99)
Potassium: 4.3 mmol/L (ref 3.5–5.1)
Sodium: 137 mmol/L (ref 135–145)

## 2016-07-31 LAB — GLUCOSE, CAPILLARY
GLUCOSE-CAPILLARY: 203 mg/dL — AB (ref 65–99)
GLUCOSE-CAPILLARY: 131 mg/dL — AB (ref 65–99)
Glucose-Capillary: 148 mg/dL — ABNORMAL HIGH (ref 65–99)
Glucose-Capillary: 156 mg/dL — ABNORMAL HIGH (ref 65–99)

## 2016-07-31 LAB — HEMOGLOBIN: Hemoglobin: 10.1 g/dL — ABNORMAL LOW (ref 12.0–16.0)

## 2016-07-31 MED ORDER — DIVALPROEX SODIUM ER 500 MG PO TB24
1500.0000 mg | ORAL_TABLET | Freq: Every day | ORAL | Status: DC
  Administered 2016-08-01 – 2016-08-03 (×3): 1500 mg via ORAL
  Filled 2016-07-31 (×3): qty 3

## 2016-07-31 NOTE — Progress Notes (Signed)
Subjective: 1 Day Post-Op Procedure(s) (LRB): TOTAL HIP ARTHROPLASTY ANTERIOR APPROACH (Left) Patient reports pain as moderate.   Patient seen in rounds with Dr. Rosita KeaMenz. Patient is well, and has had no acute complaints or problems Plan is to go home but may have to go to Rehab after hospital stay. Negative for chest pain and shortness of breath Fever: no Gastrointestinal: Negative for nausea and vomiting  Objective: Vital signs in last 24 hours: Temp:  [97.4 F (36.3 C)-99 F (37.2 C)] 98.7 F (37.1 C) (11/17 0401) Pulse Rate:  [78-102] 89 (11/17 0401) Resp:  [9-22] 16 (11/17 0401) BP: (102-157)/(63-85) 143/70 (11/17 0401) SpO2:  [87 %-100 %] 100 % (11/17 0401)  Intake/Output from previous day:  Intake/Output Summary (Last 24 hours) at 07/31/16 0606 Last data filed at 07/31/16 0541  Gross per 24 hour  Intake          3264.99 ml  Output             6250 ml  Net         -2985.01 ml    Intake/Output this shift: Total I/O In: 1665 [I.V.:1665] Out: 3300 [Urine:3300]  Labs:  Recent Labs  07/30/16 1210 07/31/16 0444  HGB 11.6* 10.1*    Recent Labs  07/30/16 1210  WBC 8.6  RBC 3.75*  HCT 36.2  PLT 254    Recent Labs  07/30/16 1210 07/31/16 0444  NA  --  137  K  --  4.3  CL  --  99*  CO2  --  33*  BUN  --  9  CREATININE 0.74 0.61  GLUCOSE  --  147*  CALCIUM  --  8.2*   No results for input(s): LABPT, INR in the last 72 hours.   EXAM General - Patient is Alert and Oriented Extremity - Dorsiflexion/Plantar flexion intact No cellulitis present Compartment is soft Dressing/Incision - clean, dry, no drainage with the wound VAC in place Motor Function - intact, moving foot and toes well on exam. The patient ambulated 2 feet on the day of surgery  Past Medical History:  Diagnosis Date  . Anxiety   . Arthritis    degenerative artgritis  osteoarthritis  . Bipolar disorder (HCC)   . COPD (chronic obstructive pulmonary disease) (HCC)   . Coronary  artery disease   . DDD (degenerative disc disease), cervical   . Diabetes mellitus without complication (HCC)   . Dyspnea   . Family history of adverse reaction to anesthesia    father blood clot after surgery died  . Headache    history of migraines  . History of pulmonary embolus (PE)   . Lower extremity deep venous thrombosis (HCC)   . Lower extremity edema   . 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 Night terrors   . OCD (obsessive compulsive disorder)   . Osteoporosis   . PTSD (post-traumatic stress disorder)     Assessment/Plan: 1 Day Post-Op Procedure(s) (LRB): TOTAL HIP ARTHROPLASTY ANTERIOR APPROACH (Left) Active Problems:   Primary localized osteoarthritis of left hip  Estimated body mass index is 38.21 kg/m as calculated from the following:   Height as of 07/23/16: 5' 8.5" (1.74 m).   Weight as of 07/23/16: 115.7 kg (255 lb). Advance diet Up with therapy D/C IV fluids  DVT Prophylaxis - Foot Pumps, TED hose and Eliquis Weight-Bearing as tolerated to left leg  Dedra Skeensodd Joie Hipps, PA-C Orthopaedic Surgery 07/31/2016, 6:06 AM

## 2016-07-31 NOTE — Evaluation (Signed)
Occupational Therapy Evaluation Patient Details Name: Amy GumRebecca G Nave MRN: [id number] DOB: [birth year] Today's Date: 07/31/2016    History of Present Illness Pt admitted for L THR. Pt with history of R TKA and has been limited in mobiity since that time.   Clinical Impression    Pt. Is a 65  y.o. female who [facility].  Patient presents with high levels of pain, limited ROM in L hip and previously from R knee from prior  R TKA 2 years ago, limited activity tolerance, and impaired functional mobility for ADLs. Patient could benefit from skilled OT services for ADL and A/E retraining, and functional mobility for ADLs in order to improve overall ADL functioning, and return to PLOF.  Rec SNF at this time due to limitations but if pain levels decrease to allow more mobility she could progress to only needed OT HH.      Follow Up Recommendations  SNF (possibly OT HH if pain levels decrease)    Equipment Recommendations       Recommendations for Other Services       Precautions / Restrictions Precautions Precautions: Fall;Anterior Hip Restrictions Weight Bearing Restrictions: Yes LLE Weight Bearing: Weight bearing as tolerated      Mobility Bed Mobility                  Transfers                      Balance                                            ADL Overall ADL's : Needs assistance/impaired Eating/Feeding: Independent;Set up   Grooming: Wash/dry hands;Wash/dry face;Oral care;Applying deodorant;Brushing hair;Independent;Set up           Upper Body Dressing : Independent;Set up   Lower Body Dressing: Maximal assistance;Set up Lower Body Dressing Details (indicate cue type and reason): Demonstration of AD only due to high pain levels and pt refusing to sit at EOB.  She was also lethargic and needed cues to maintain attention.  Demonstration of reacher, sock aid, LH shoe horn and LH sponge and review of AD  catalog for other rec items such as elastic shoe laces, hair dryer holder.                     Vision     Perception     Praxis      Pertinent Vitals/Pain Pain Assessment: Faces Faces Pain Scale: Hurts little more Pain Location: L hip  Pain Descriptors / Indicators: Aching;Operative site guarding;Constant Pain Intervention(s): Limited activity within patient's tolerance;Monitored during session;Premedicated before session     Hand Dominance Right   Extremity/Trunk Assessment Upper Extremity Assessment Upper Extremity Assessment: Overall WFL for tasks assessed (pt with long finger nails but able to manipulate items for ADLs)   Lower Extremity Assessment Lower Extremity Assessment: Defer to PT evaluation       Communication Communication Communication: No difficulties   Cognition Arousal/Alertness: Lethargic;Suspect due to medications Behavior During Therapy: [facility] for tasks assessed/performed;Anxious Overall Cognitive Status: Within Functional Limits for tasks assessed                     General Comments       Exercises       Shoulder  Instructions      Home Living Family/patient expects to be discharged to:: Private residence Living Arrangements: Spouse/significant other Available Help at Discharge: Family;Available 24 hours/day Type of Home: House Home Access: Ramped entrance     Home Layout: One level     Bathroom Shower/Tub: Walk-in shower;Door   Foot LockerBathroom Toilet: Handicapped height Bathroom Accessibility: Yes   Home Equipment: Walker - standard;Wheelchair - manual;Shower seat - built in;Hand held shower head;Grab bars - tub/shower;Grab bars - toilet          Prior Functioning/Environment Level of Independence: Independent with assistive device(s)        Comments: was able to walk 3-4 minutes with AD (mainly SW) secondary to back pain, however mainly [facility] in [facility] for longer distances. Husband was helping with socks and shoes  due to decreased AROM of R knee and back pain.        OT Problem List: Decreased strength;Decreased range of motion;Decreased activity tolerance;Pain;Decreased knowledge of use of DME or AE   OT Treatment/Interventions: Self-care/ADL training;Patient/family education;DME and/or AE instruction    OT Goals(Current goals can be found in the care plan section) Acute Rehab OT Goals Patient Stated Goal: to sstart to do more for myself again OT Goal Formulation: With patient Time For Goal Achievement: 08/14/16 Potential to Achieve Goals: Good ADL Goals Pt Will Perform Lower Body Dressing: with set-up;with adaptive equipment;with min assist;sit to/from stand Pt Will Transfer to Toilet: with set-up;with min assist;bedside commode;stand pivot transfer (BSC over toilet to simulate higher toilet at home)  OT Frequency: Min 1X/week   Barriers to D/C:            Co-evaluation              End of Session    Activity Tolerance: Patient limited by pain;Patient limited by lethargy Patient left: in bed;with call bell/phone within reach;with bed alarm set   Time: 1045-1110 OT Time Calculation (min): 25 min Charges:  OT General Charges $OT Visit: 1 Procedure OT Evaluation $OT Eval Moderate Complexity: 1 Procedure OT Treatments $Self Care/Home Management : 8-22 mins G-Codes:    Susanne BordersSusan Wofford, OTR/L ascom [phone] 07/31/16, 11:38 AM

## 2016-07-31 NOTE — Anesthesia Postprocedure Evaluation (Signed)
Anesthesia Post Note  Patient: Amy Sweeney  Procedure(s) Performed: Procedure(s) (LRB): TOTAL HIP ARTHROPLASTY ANTERIOR APPROACH (Left)  Patient location during evaluation: Nursing Unit Anesthesia Type: Spinal Level of consciousness: awake and alert and oriented Pain management: pain level controlled Vital Signs Assessment: post-procedure vital signs reviewed and stable Respiratory status: spontaneous breathing Cardiovascular status: stable Postop Assessment: no signs of nausea or vomiting and adequate PO intake Anesthetic complications: no    Last Vitals:  Vitals:   07/31/16 0401 07/31/16 0804  BP: (!) 143/70 (!) 146/62  Pulse: 89 91  Resp: 16 18  Temp: 37.1 C 36.7 C    Last Pain:  Vitals:   07/31/16 0731  TempSrc:   PainSc: 9                  Amy Sweeney,  Alessandra BevelsJennifer M

## 2016-07-31 NOTE — Progress Notes (Signed)
Patient refusing Physical Therapy despite interventions taking for pain management. Patient resting in bed no acute distress noted. Patient denies pain on hip but states she is having nerve pain. Scheduled Gabapentin given. Family at bedside. Education given to patient on importance of rehab. Patient verbalizes understanding. PT rep Judeth CornfieldStephanie states she will return for another attempt.

## 2016-07-31 NOTE — Progress Notes (Signed)
Physical Therapy Treatment Patient Details Name: Amy GumRebecca G Sweeney MRN: [id number] DOB: [birth year] Today's Date: 07/31/2016    History of Present Illness Pt admitted for L THR. Pt with history of R TKA and has been limited in mobiity since that time.    PT Comments    Pt is making limited progress towards goals with poor effort with OOB mobility. Pt insistent on using bathroom and agreeable to [facility] 1 attempt to stand with pt able to achieve upright posture, however unable to ambulate safely to [facility]. +2 max assist performed. Pt crying and yelling during session and is upset with care here. She doesn't feel she is getting all her meds on time. Multiple discussions with RN to coordinate care. Needs heavy encouragement for participation in therapy. Limited endurance with there-ex.  Follow Up Recommendations  SNF     Equipment Recommendations  3in1 (PT)    Recommendations for Other Services       Precautions / Restrictions Precautions Precautions: Fall;Anterior Hip Precaution Booklet Issued: Yes (comment) Restrictions Weight Bearing Restrictions: Yes LLE Weight Bearing: Weight bearing as tolerated    Mobility  Bed Mobility Overal bed mobility: Needs Assistance Bed Mobility: Supine to Sit     Supine to sit: Max assist;+2 for physical assistance;+2 for safety/equipment     General bed mobility comments: assist for sliding B LE off bed and assist required for upright posture. Once seated at EOB, pt needs assist scooting R hip out towards edge. Pt cries out in pain with all movement  Transfers Overall transfer level: Needs assistance Equipment used: Standard walker Transfers: Sit to/from Stand Sit to Stand: Max assist;+2 physical assistance         General transfer comment: Standing attempt performed with pt pushing from seated surface. +2 assist required. Once standing, pt attempts to weight shift and is unable to take step with L LE. Uncontrolled  descent back onto bed. Not safe for second attempt to stand at this time  Ambulation/Gait             General Gait Details: unable at this time   Stairs            Wheelchair Mobility    Modified Rankin (Stroke Patients Only)       Balance                                    Cognition Arousal/Alertness: Awake/alert Behavior During Therapy: WFL for tasks assessed/performed;Anxious Overall Cognitive Status: Within Functional Limits for tasks assessed                      Exercises Other Exercises Other Exercises: supine ther-ex performed on L LE including ankle pumps, quad sets, SAQ, and hip abd/add. All ther-ex performed x 10 reps with min assist.    General Comments        Pertinent Vitals/Pain Pain Assessment: Faces Faces Pain Scale: Hurts whole lot Pain Location: L hip Pain Descriptors / Indicators: Operative site guarding Pain Intervention(s): Limited activity within patient's tolerance;Premedicated before session;Repositioned    Home Living Family/patient expects to be discharged to:: Private residence Living Arrangements: Spouse/significant other Available Help at Discharge: Family;Available 24 hours/day Type of Home: House Home Access: Ramped entrance   Home Layout: One level Home Equipment: Walker - standard;Wheelchair - manual;Shower seat - built in;Hand held shower head;Grab bars - tub/shower;Grab bars - toilet  Prior Function Level of Independence: Independent with assistive device(s)      Comments: was able to walk 3-4 minutes with AD (mainly SW) secondary to back pain, however [facility] for longer distances. Husband was helping with socks and shoes due to decreased AROM of R knee and back pain.   PT Goals (current goals can now be found in the care plan section) Acute Rehab PT Goals Patient Stated Goal: to get to the bathroom PT Goal Formulation: With patient Time For Goal Achievement:  08/13/16 Potential to Achieve Goals: Good Progress towards PT goals: Progressing toward goals    Frequency    BID      PT Plan Current plan remains appropriate    Co-evaluation             End of Session Equipment Utilized During Treatment: Gait belt Activity Tolerance: Patient limited by pain Patient left: in bed;with bed alarm set     Time: 1191-47821358-1424 PT Time Calculation (min) (ACUTE ONLY): 26 min  Charges:  $Therapeutic Exercise: 8-22 mins $Therapeutic Activity: 8-22 mins                    G Codes:      Amy Sweeney 07/31/2016, 2:44 PM  Elizabeth PalauStephanie Nelvin Tomb, PT, DPT [phone]

## 2016-07-31 NOTE — Care Management (Addendum)
CSW aware of SNF recommendations from PT. Will assist as needed.  RNCM spoke with patient and she is willing to go to SNF. CSW will follow up with patient.

## 2016-07-31 NOTE — Progress Notes (Signed)
Physical Therapy Treatment Patient Details Name: Amy GumRebecca G Sweeney MRN: [id number] DOB: 18-Mar-1951 Today's Date: 07/31/2016    History of Present Illness Pt admitted for L THR. Pt with history of R TKA and has been limited in mobiity since that time.    PT Comments    Pt is making limited progress towards goals and is very limited by pain. Pt crying during session and unable to ambulate in room. 2 attempts for standing with +2 assist and SW with uncontrolled descent back to bed. Limited progress with there-ex. Will continue to assess.  Follow Up Recommendations  SNF     Equipment Recommendations  3in1 (PT)    Recommendations for Other Services       Precautions / Restrictions Precautions Precautions: Fall;Anterior Hip Precaution Booklet Issued: Yes (comment) Restrictions Weight Bearing Restrictions: Yes LLE Weight Bearing: Weight bearing as tolerated    Mobility  Bed Mobility Overal bed mobility: Needs Assistance Bed Mobility: Supine to Sit     Supine to sit: Mod assist;+2 for physical assistance     General bed mobility comments: assist for sliding B LE off bed and assist for upright posture. Once seated pt sits on edge of bed and complains of pain.  Transfers Overall transfer level: Needs assistance Equipment used: Standard walker Transfers: Sit to/from Stand Sit to Stand: Max assist;+2 physical assistance         General transfer comment: transfers performed with pt pushing from seated surface. Once standing, pt stands with flexed posture and only able to stand for 3 seconds prior to uncontrolled descent back to bed. On 2nd attempt, pt able to stand for about 30 seconds prior to sitting uncontrolled back on bed.  Ambulation/Gait             General Gait Details: Poor effort given, unable to ambulate at this time.   Stairs            Wheelchair Mobility    Modified Rankin (Stroke Patients Only)       Balance                                    Cognition Arousal/Alertness: Awake/alert Behavior During Therapy: WFL for tasks assessed/performed;Anxious Overall Cognitive Status: Within Functional Limits for tasks assessed                      Exercises Other Exercises Other Exercises: supine ther-ex performed on L LE including ankle pumps, quad sets, glut sets, and hip abd/add. All ther-ex performed x 10 reps with min assist for correct technique.    General Comments        Pertinent Vitals/Pain Pain Assessment: Faces Faces Pain Scale: Hurts whole lot Pain Location: L hip Pain Descriptors / Indicators: Operative site guarding Pain Intervention(s): Repositioned;Premedicated before session;Limited activity within patient's tolerance    Home Living Family/patient expects to be discharged to:: Private residence Living Arrangements: Spouse/significant other Available Help at Discharge: Family;Available 24 hours/day Type of Home: House Home Access: Ramped entrance   Home Layout: One level Home Equipment: Walker - standard;Wheelchair - manual;Shower seat - built in;Hand held shower head;Grab bars - tub/shower;Grab bars - toilet      Prior Function Level of Independence: Independent with assistive device(s)      Comments: was able to walk 3-4 minutes with AD (mainly SW) secondary to back pain, however mainly gets around in [facility] for longer distances.  Husband was helping with socks and shoes due to decreased AROM of R knee and back pain.   PT Goals (current goals can now be found in the care plan section) Acute Rehab PT Goals Patient Stated Goal: to sstart to do more for myself again PT Goal Formulation: With patient Time For Goal Achievement: 08/13/16 Potential to Achieve Goals: Good Progress towards PT goals: Progressing toward goals    Frequency    BID      PT Plan Current plan remains appropriate    Co-evaluation             End of Session Equipment Utilized During Treatment:  Gait belt Activity Tolerance: Patient limited by pain Patient left: in bed;with bed alarm set     Time: 1000-1038 PT Time Calculation (min) (ACUTE ONLY): 38 min  Charges:  $Therapeutic Exercise: 23-37 mins $Therapeutic Activity: 8-22 mins                    G Codes:      Konstantinos Cordoba 07/31/2016, 11:52 AM  Elizabeth PalauStephanie Harles Evetts, PT, DPT [phone]

## 2016-08-01 LAB — GLUCOSE, CAPILLARY
GLUCOSE-CAPILLARY: 172 mg/dL — AB (ref 65–99)
GLUCOSE-CAPILLARY: 133 mg/dL — AB (ref 65–99)
Glucose-Capillary: 210 mg/dL — ABNORMAL HIGH (ref 65–99)
Glucose-Capillary: 181 mg/dL — ABNORMAL HIGH (ref 65–99)

## 2016-08-01 MED ORDER — FENTANYL 75 MCG/HR TD PT72: 75.0000 ug | MEDICATED_PATCH | TRANSDERMAL | 0 refills | Status: DC

## 2016-08-01 MED ORDER — OXYCODONE HCL 10 MG PO TABS: 10.0000 mg | ORAL_TABLET | Freq: Three times a day (TID) | ORAL | 0 refills | Status: DC

## 2016-08-01 NOTE — Progress Notes (Signed)
Physical Therapy Treatment Patient Details Name: Amy GumRebecca G Sweeney MRN: [id number] DOB: 06-13-1951 Today's Date: 08/01/2016    History of Present Illness Pt admitted for L THR. Pt with history of R TKA and has been limited in mobiity since that time.    PT Comments    Pt in chair ready to return to bed.  Stood with mod a x 2 and transferred with poor technique back to bed.  She would not listed to verbal cues to put both hands on walker once up in order to turn safely.  She kept one hand on walker and one on chair handrail.  Despite education and explanation, pt continued to transfer stating "This is the way I am doing it".  Overall mobility is improving but remains with +2 assist.     Follow Up Recommendations  SNF     Equipment Recommendations  3in1 (PT)    Recommendations for Other Services       Precautions / Restrictions Precautions Precautions: Fall;Anterior Hip Restrictions Weight Bearing Restrictions: Yes LLE Weight Bearing: Weight bearing as tolerated    Mobility  Bed Mobility Overal bed mobility: Needs Assistance Bed Mobility: Sit to Supine     Supine to sit: Max assist;+2 for physical assistance;+2 for safety/equipment     General bed mobility comments: asssit to manage le  Transfers Overall transfer level: Needs assistance Equipment used: Standard walker Transfers: Sit to/from BJ'sStand;Stand Pivot Transfers Sit to Stand: Mod assist;+2 physical assistance Stand pivot transfers: Mod assist;+2 physical assistance       General transfer comment: able to transfer to recliner today but poor quality and pt trying to sit before fully turning to chair.  resisitant to re-direction.    Ambulation/Gait Ambulation/Gait assistance: Min assist;+2 physical assistance Ambulation Distance (Feet): 2 Feet Assistive device: Standard walker Gait Pattern/deviations: Step-to pattern     General Gait Details: able to step to chair but not functionally  ambulate   Stairs            Wheelchair Mobility    Modified Rankin (Stroke Patients Only)       Balance Overall balance assessment: Needs assistance Sitting-balance support: Feet supported Sitting balance-Leahy Scale: Good     Standing balance support: Bilateral upper extremity supported Standing balance-Leahy Scale: Fair                      Cognition Arousal/Alertness: Awake/alert Behavior During Therapy: WFL for tasks assessed/performed;Anxious Overall Cognitive Status: Within Functional Limits for tasks assessed                      Exercises Other Exercises Other Exercises: supine exercises for general ROM to decrease pain prior to mobility for ankle pumps, quad sets, glut squeeze, heel slides and ab/adduction    General Comments        Pertinent Vitals/Pain Pain Assessment: 0-10 Pain Score: 6  Pain Location: L hip Pain Descriptors / Indicators: Operative site guarding;Constant Pain Intervention(s): Limited activity within patient's tolerance    Home Living                      Prior Function            PT Goals (current goals can now be found in the care plan section) Progress towards PT goals: Progressing toward goals    Frequency    BID      PT Plan Current plan remains appropriate    Co-evaluation  End of Session Equipment Utilized During Treatment: Gait belt Activity Tolerance: Patient limited by pain Patient left: in bed;with call bell/phone within reach;with bed alarm set;with nursing/sitter in room;with SCD's reapplied     Time: 1255-1310 PT Time Calculation (min) (ACUTE ONLY): 15 min  Charges:  $Therapeutic Exercise: 8-22 mins $Therapeutic Activity: 8-22 mins                    G Codes:      Danielle DessSarah Avon Sweeney 08/01/2016, 1:10 PM

## 2016-08-01 NOTE — Progress Notes (Addendum)
Subjective: 2 Days Post-Op Procedure(s) (LRB): TOTAL HIP ARTHROPLASTY ANTERIOR APPROACH (Left) Patient reports pain as moderate.   Patient seen in rounds with Dr. Ernest PineHooten. Patient is well, and has had no acute complaints or problems Plan is to go to Rehab after hospital stay. Negative for chest pain and shortness of breath Fever: no Gastrointestinal: Negative for nausea and vomiting  Objective: Vital signs in last 24 hours: Temp:  [98 F (36.7 C)-100 F (37.8 C)] 98.6 F (37 C) (11/18 0401) Pulse Rate:  [91-111] 107 (11/18 0401) Resp:  [18-19] 19 (11/18 0401) BP: (141-170)/(62-90) 164/80 (11/18 0401) SpO2:  [93 %-100 %] 98 % (11/18 0401) Weight:  [115 kg (253 lb 8.5 oz)] 115 kg (253 lb 8.5 oz) (11/17 1830)  Intake/Output from previous day:  Intake/Output Summary (Last 24 hours) at 08/01/16 0643 Last data filed at 08/01/16 0058  Gross per 24 hour  Intake             1590 ml  Output                0 ml  Net             1590 ml    Intake/Output this shift: No intake/output data recorded.  Labs:  Recent Labs  07/30/16 1210 07/31/16 0444  HGB 11.6* 10.1*    Recent Labs  07/30/16 1210  WBC 8.6  RBC 3.75*  HCT 36.2  PLT 254    Recent Labs  07/30/16 1210 07/31/16 0444  NA  --  137  K  --  4.3  CL  --  99*  CO2  --  33*  BUN  --  9  CREATININE 0.74 0.61  GLUCOSE  --  147*  CALCIUM  --  8.2*   No results for input(s): LABPT, INR in the last 72 hours.   EXAM General - Patient is Alert and Oriented Extremity - Dorsiflexion/Plantar flexion intact No cellulitis present Compartment is soft Dressing/Incision - clean, dry, no drainage with the wound VAC in place Motor Function - intact, moving foot and toes well on exam. The patient refused physical therapy yesterday.  Past Medical History:  Diagnosis Date  . Anxiety   . Arthritis    degenerative artgritis  osteoarthritis  . Bipolar disorder (HCC)   . COPD (chronic obstructive pulmonary disease)  (HCC)   . Coronary artery disease   . DDD (degenerative disc disease), cervical   . Diabetes mellitus without complication (HCC)   . Dyspnea   . Family history of adverse reaction to anesthesia    father blood clot after surgery died  . Headache    history of migraines  . History of pulmonary embolus (PE)   . Lower extremity deep venous thrombosis (HCC)   . Lower extremity edema   . 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 Night terrors   . OCD (obsessive compulsive disorder)   . Osteoporosis   . PTSD (post-traumatic stress disorder)     Assessment/Plan: 2 Days Post-Op Procedure(s) (LRB): TOTAL HIP ARTHROPLASTY ANTERIOR APPROACH (Left) Active Problems:   Primary localized osteoarthritis of left hip  Estimated body mass index is 38.55 kg/m as calculated from the following:   Height as of this encounter: 5\' 8"  (1.727 m).   Weight as of this encounter: 115 kg (253 lb 8.5 oz). Advance diet Up with therapy D/C IV fluids We discussed for physical therapy and her lack of effort.  The patient will need a bowel movement.  DVT Prophylaxis - Foot Pumps, TED hose and Eliquis Weight-Bearing as tolerated to left leg  Dedra Skeensodd Aerionna Moravek, PA-C Orthopaedic Surgery 08/01/2016, 6:43 AM

## 2016-08-01 NOTE — Discharge Instructions (Signed)
ANTERIOR APPROACH TOTAL HIP REPLACEMENT POSTOPERATIVE DIRECTIONS   Hip Rehabilitation, Guidelines Following Surgery  The results of a hip operation are greatly improved after range of motion and muscle strengthening exercises. Follow all safety measures which are given to protect your hip. If any of these exercises cause increased pain or swelling in your joint, decrease the amount until you are comfortable again. Then slowly increase the exercises. Call your caregiver if you have problems or questions.   HOME CARE INSTRUCTIONS  Remove items at home which could result in a fall. This includes throw rugs or furniture in walking pathways.   ICE to the affected hip every three hours for 30 minutes at a time and then as needed for pain and swelling.  Continue to use ice on the hip for pain and swelling from surgery. You may notice swelling that will progress down to the foot and ankle.  This is normal after surgery.  Elevate the leg when you are not up walking on it.    Continue to use the breathing machine which will help keep your temperature down.  It is common for your temperature to cycle up and down following surgery, especially at night when you are not up moving around and exerting yourself.  The breathing machine keeps your lungs expanded and your temperature down.  Do not place pillow under knee, focus on keeping the knee straight while resting  DIET You may resume your previous home diet once your are discharged from the hospital.  DRESSING / WOUND CARE / SHOWERING Keep the surgical dressing until follow up.  The dressing is water proof, so you can shower without any extra covering.  IF THE DRESSING FALLS OFF or the wound gets wet inside, change the dressing with sterile gauze.  Please use good hand washing techniques before changing the dressing.  Do not use any lotions or creams on the incision until instructed by your surgeon.    The wound VAC that is on the left hip will be left in  place for 1 week and then removed. A new dressing can be applied after the wound VAC is removed. The container can be changed with the wound VAC as needed. Keep your dressing dry with showering.  You can keep it covered and pat dry. Change the surgical dressing daily and reapply a dry dressing each time.  ACTIVITY Walk with your walker as instructed. Use walker as long as suggested by your caregivers. Avoid periods of inactivity such as sitting longer than an hour when not asleep. This helps prevent blood clots.  You may resume a sexual relationship in one month or when given the OK by your doctor.  You may return to work once you are cleared by your doctor.  Do not drive a car for 6 weeks or until released by you surgeon.  Do not drive while taking narcotics.  WEIGHT BEARING Weight bearing as tolerated with assist device (walker, cane, etc) as directed, use it as long as suggested by your surgeon or therapist, typically at least 4-6 weeks.  POSTOPERATIVE CONSTIPATION PROTOCOL Constipation - defined medically as fewer than three stools per week and severe constipation as less than one stool per week.  One of the most common issues patients have following surgery is constipation.  Even if you have a regular bowel pattern at home, your normal regimen is likely to be disrupted due to multiple reasons following surgery.  Combination of anesthesia, postoperative narcotics, change in appetite and fluid intake  all can affect your bowels.  In order to avoid complications following surgery, here are some recommendations in order to help you during your recovery period.  Colace (docusate) - Pick up an over-the-counter form of Colace or another stool softener and take twice a day as long as you are requiring postoperative pain medications.  Take with a full glass of water daily.  If you experience loose stools or diarrhea, hold the colace until you stool forms back up.  If your symptoms do not get better  within 1 week or if they get worse, check with your doctor.  Dulcolax (bisacodyl) - Pick up over-the-counter and take as directed by the product packaging as needed to assist with the movement of your bowels.  Take with a full glass of water.  Use this product as needed if not relieved by Colace only.   MiraLax (polyethylene glycol) - Pick up over-the-counter to have on hand.  MiraLax is a solution that will increase the amount of water in your bowels to assist with bowel movements.  Take as directed and can mix with a glass of water, juice, soda, coffee, or tea.  Take if you go more than two days without a movement. Do not use MiraLax more than once per day. Call your doctor if you are still constipated or irregular after using this medication for 7 days in a row.  If you continue to have problems with postoperative constipation, please contact the office for further assistance and recommendations.  If you experience "the worst abdominal pain ever" or develop nausea or vomiting, please contact the office immediatly for further recommendations for treatment.  ITCHING  If you experience itching with your medications, try taking only a single pain pill, or even half a pain pill at a time.  You can also use Benadryl over the counter for itching or also to help with sleep.   TED HOSE STOCKINGS Wear the elastic stockings on both legs for three weeks following surgery during the day but you may remove then at night for sleeping.  MEDICATIONS See your medication summary on the After Visit Summary that the nursing staff will review with you prior to discharge.  You may have some home medications which will be placed on hold until you complete the course of blood thinner medication.  It is important for you to complete the blood thinner medication as prescribed by your surgeon.  Continue your approved medications as instructed at time of discharge.  PRECAUTIONS If you experience chest pain or shortness  of breath - call 911 immediately for transfer to the hospital emergency department.  If you develop a fever greater that 101 F, purulent drainage from wound, increased redness or drainage from wound, foul odor from the wound/dressing, or calf pain - CONTACT YOUR SURGEON.                                                   FOLLOW-UP APPOINTMENTS Make sure you keep all of your appointments after your operation with your surgeon and caregivers. You should call the office at the above phone number and make an appointment for approximately two weeks after the date of your surgery or on the date instructed by your surgeon outlined in the "After Visit Summary".  RANGE OF MOTION AND STRENGTHENING EXERCISES  These exercises are designed to help  you keep full movement of your hip joint. Follow your caregiver's or physical therapist's instructions. Perform all exercises about fifteen times, three times per day or as directed. Exercise both hips, even if you have had only one joint replacement. These exercises can be done on a training (exercise) mat, on the floor, on a table or on a bed. Use whatever works the best and is most comfortable for you. Use music or television while you are exercising so that the exercises are a pleasant break in your day. This will make your life better with the exercises acting as a break in routine you can look forward to.  Lying on your back, slowly slide your foot toward your buttocks, raising your knee up off the floor. Then slowly slide your foot back down until your leg is straight again.  Lying on your back spread your legs as far apart as you can without causing discomfort.  Lying on your side, raise your upper leg and foot straight up from the floor as far as is comfortable. Slowly lower the leg and repeat.  Lying on your back, tighten up the muscle in the front of your thigh (quadriceps muscles). You can do this by keeping your leg straight and trying to raise your heel off the  floor. This helps strengthen the largest muscle supporting your knee.  Lying on your back, tighten up the muscles of your buttocks both with the legs straight and with the knee bent at a comfortable angle while keeping your heel on the floor.   IF YOU ARE TRANSFERRED TO A SKILLED REHAB FACILITY If the patient is transferred to a skilled rehab facility following release from the hospital, a list of the current medications will be sent to the facility for the patient to continue.  When discharged from the skilled rehab facility, please have the facility set up the patient's Home Health Physical Therapy prior to being released. Also, the skilled facility will be responsible for providing the patient with their medications at time of release from the facility to include their pain medication, the muscle relaxants, and their blood thinner medication. If the patient is still at the rehab facility at time of the two week follow up appointment, the skilled rehab facility will also need to assist the patient in arranging follow up appointment in our office and any transportation needs.  MAKE SURE YOU:  Understand these instructions.  Get help right away if you are not doing well or get worse.    Pick up stool softner and laxative for home use following surgery while on pain medications. Do not submerge incision under water. Please use good hand washing techniques while changing dressing each day. May shower starting three days after surgery. Please use a clean towel to pat the incision dry following showers. Continue to use ice for pain and swelling after surgery. Do not use any lotions or creams on the incision until instructed by your surgeon.

## 2016-08-01 NOTE — Progress Notes (Signed)
OT Cancellation Note  Patient Details Name: Amy Sweeney MRN: [id number] DOB: 1951-01-29   Cancelled Treatment:    Reason Eval/Treat Not Completed: Patient declined, no reason specified. Pt declined OT treatment, was eating lunch, high pain level, and did not feel she had the energy to participate due to previous PT session. Will attempt to treat at later date/time when pt more agreeable.  Eliezer BottomJamie L Stiller, OTR/L 08/01/2016, 12:09 PM

## 2016-08-01 NOTE — Progress Notes (Signed)
Physical Therapy Treatment Patient Details Name: Amy Sweeney MRN: [id number] DOB: August 11, 1951 Today's Date: 08/01/2016    History of Present Illness Pt admitted for L THR. Pt with history of R TKA and has been limited in mobiity since that time.    PT Comments    Pt in bed initially refusing session but agrees with encouragement.  Educated on importance of daily mobility and risks of immobility.  Participated in exercises as described below.  To edge of bed with max a x 2.  Once sitting she is able to sit without support.  Stood x 2 with mod a x 2.  She was not able to stand on first attempt as she tried to pull up on walker and did not accept redirection.  On second attempt she tried pushing off bed and was successful.  She pivoted with small shuffling steps to recliner at bedside.  Pt attempting to sit prior to turning fully.     Follow Up Recommendations  SNF     Equipment Recommendations  3in1 (PT)    Recommendations for Other Services       Precautions / Restrictions Precautions Precautions: Fall;Anterior Hip Restrictions Weight Bearing Restrictions: Yes LLE Weight Bearing: Weight bearing as tolerated    Mobility  Bed Mobility Overal bed mobility: Needs Assistance Bed Mobility: Supine to Sit     Supine to sit: Max assist;+2 for physical assistance;+2 for safety/equipment     General bed mobility comments: asssit to manage le  Transfers Overall transfer level: Needs assistance Equipment used: Standard walker Transfers: Stand Pivot Transfers Sit to Stand: Mod assist;+2 physical assistance         General transfer comment: able to transfer to recliner today but poor quality and pt trying to sit before fully turning to chair.  resisitant to re-direction.    Ambulation/Gait Ambulation/Gait assistance: Min assist;+2 physical assistance Ambulation Distance (Feet): 2 Feet Assistive device: Standard walker Gait Pattern/deviations: Step-to pattern      General Gait Details: able to step to chair but not functionally ambulate   Stairs            Wheelchair Mobility    Modified Rankin (Stroke Patients Only)       Balance Overall balance assessment: Needs assistance Sitting-balance support: Feet supported Sitting balance-Leahy Scale: Good     Standing balance support: Bilateral upper extremity supported Standing balance-Leahy Scale: Good                      Cognition Arousal/Alertness: Awake/alert Behavior During Therapy: WFL for tasks assessed/performed;Anxious Overall Cognitive Status: Within Functional Limits for tasks assessed                      Exercises Other Exercises Other Exercises: supine exercises for general ROM to decrease pain prior to mobility for ankle pumps, quad sets, glut squeeze, heel slides and ab/adduction    General Comments        Pertinent Vitals/Pain Pain Assessment: 0-10 Pain Score: 8  Pain Location: L hip Pain Intervention(s): Limited activity within patient's tolerance    Home Living                      Prior Function            PT Goals (current goals can now be found in the care plan section) Progress towards PT goals: Progressing toward goals    Frequency    BID  PT Plan Current plan remains appropriate    Co-evaluation             End of Session Equipment Utilized During Treatment: Gait belt Activity Tolerance: Patient limited by pain Patient left: in chair;with call bell/phone within reach;with chair alarm set;with SCD's reapplied     Time: 1015-1050 PT Time Calculation (min) (ACUTE ONLY): 35 min  Charges:  $Therapeutic Exercise: 8-22 mins $Therapeutic Activity: 8-22 mins                    G Codes:      Amy Sweeney 08/01/2016, 11:17 AM

## 2016-08-02 LAB — GLUCOSE, CAPILLARY
GLUCOSE-CAPILLARY: 168 mg/dL — AB (ref 65–99)
GLUCOSE-CAPILLARY: 152 mg/dL — AB (ref 65–99)
Glucose-Capillary: 189 mg/dL — ABNORMAL HIGH (ref 65–99)
Glucose-Capillary: 158 mg/dL — ABNORMAL HIGH (ref 65–99)

## 2016-08-02 MED ORDER — LACTULOSE 10 GM/15ML PO SOLN: 20.0000 g | Freq: Two times a day (BID) | ORAL | Status: DC | PRN

## 2016-08-02 MED ORDER — SENNOSIDES-DOCUSATE SODIUM 8.6-50 MG PO TABS
1.0000 | ORAL_TABLET | Freq: Two times a day (BID) | ORAL | Status: DC
  Administered 2016-08-02 – 2016-08-03 (×3): 1 via ORAL
  Filled 2016-08-02 (×3): qty 1

## 2016-08-02 NOTE — [place] FL2 (Deleted)
[place] MEDICAID FL2 LEVEL OF CARE SCREENING TOOL     IDENTIFICATION  Patient Name: Amy Sweeney Birthdate: 1951-03-06 Sex: female Admission Date (Current Location): 07/30/2016  [place] Number:  Chiropodist and Address:  [facility], [address], [place], [place] [address]      Provider Number: [id number]  Attending Physician Name and Address:  Kennedy Bucker, MD  Relative Name and Phone Number:       Current Level of Care: Hospital Recommended Level of Care: Skilled Nursing Facility Prior Approval Number:    Date Approved/Denied:   PASRR Number: [id number]  Discharge Plan: SNF    Current Diagnoses: Patient Active Problem List   Diagnosis Date Noted  . Primary localized osteoarthritis of left hip 07/30/2016  . History of DVT (deep vein thrombosis) 07/16/2016  . Traumatic arthritis of knee 05/30/2015    Orientation RESPIRATION BLADDER Height & Weight     Self, Situation, Time, Place  Normal Continent Weight: 253 lb 8.5 oz (115 kg) Height:  5\' 8"  (172.7 cm)  BEHAVIORAL SYMPTOMS/MOOD NEUROLOGICAL BOWEL NUTRITION STATUS   (None)  (None.) Continent Diet (Diet: Carb Modified)  AMBULATORY STATUS COMMUNICATION OF NEEDS Skin   Total Care Verbally Surgical wounds                       Personal Care Assistance Level of Assistance  Bathing, Feeding, Dressing, Total care Bathing Assistance: Maximum assistance Feeding assistance: Independent Dressing Assistance: Maximum assistance Total Care Assistance: Maximum assistance   Functional Limitations Info  Sight, Hearing, Speech Sight Info: Adequate Hearing Info: Adequate Speech Info: Adequate    SPECIAL CARE FACTORS FREQUENCY  PT (By licensed PT), OT (By licensed OT)     PT Frequency: Up to 5X per day, 5 days per week OT Frequency:  (5)            Contractures Contractures Info: Present    Additional Factors Info  Allergies Code Status Info:  Full code Allergies Info: Bee Venom, Morphine And Related, Adhesive Tape, Celebrex Celecoxib, Erythromycin, Tegaderm Ag Mesh Silver, Grape Seed, Levaquin Levofloxacin In D5w   Insulin Sliding Scale Info:  (NovoLog)       Current Medications (08/02/2016):  This is the current hospital active medication list Current Facility-Administered Medications  Medication Dose Route Frequency Provider Last Rate Last Dose  . 0.9 %  sodium chloride infusion   Intravenous Continuous Kennedy Bucker, MD   Stopped at 07/31/16 1647  . acetaminophen (TYLENOL) tablet 650 mg  650 mg Oral Q6H PRN Kennedy Bucker, MD   650 mg at 07/30/16 1602   Or  . acetaminophen (TYLENOL) suppository 650 mg  650 mg Rectal Q6H PRN Kennedy Bucker, MD      . alum & mag hydroxide-simeth (MAALOX/MYLANTA) 200-200-20 MG/5ML suspension 30 mL  30 mL Oral Q4H PRN Kennedy Bucker, MD      . apixaban Everlene Balls) tablet 5 mg  5 mg Oral Q12H Kennedy Bucker, MD   5 mg at 08/02/16 0903  . baclofen (LIORESAL) tablet 5 mg  5 mg Oral BID Kennedy Bucker, MD   5 mg at 08/02/16 0901  . bisacodyl (DULCOLAX) suppository 10 mg  10 mg Rectal Daily PRN Kennedy Bucker, MD      . calcium citrate (CALCITRATE - dosed in mg elemental calcium) tablet 2,375 mg  2,375 mg Oral Q1400 Kennedy Bucker, MD   2,375 mg at 08/01/16 1304  . cholecalciferol (VITAMIN D) tablet 5,000  Units  5,000 Units Oral Q1400 Kennedy BuckerMichael Menz, MD   5,000 Units at 08/01/16 1300  . clonazePAM (KLONOPIN) tablet 0.5 mg  0.5 mg Oral BID Kennedy BuckerMichael Menz, MD   0.5 mg at 08/02/16 0903  . [START ON 12/28/2016] denosumab (PROLIA) injection 60 mg  60 mg Subcutaneous Q6 months Kennedy BuckerMichael Menz, MD      . diphenhydrAMINE (BENADRYL) tablet 50 mg  50 mg Oral QHS Kennedy BuckerMichael Menz, MD   50 mg at 08/01/16 2016  . divalproex (DEPAKOTE ER) 24 hr tablet 1,500 mg  1,500 mg Oral Daily Dedra Skeensodd Mundy, PA-C   1,500 mg at 08/02/16 0900  . docusate sodium (COLACE) capsule 100 mg  100 mg Oral BID Kennedy BuckerMichael Menz, MD   100 mg at 08/02/16 0901  . DULoxetine  (CYMBALTA) DR capsule 120 mg  120 mg Oral Daily Kennedy BuckerMichael Menz, MD   120 mg at 08/02/16 0908  . fentaNYL (DURAGESIC - dosed mcg/hr) patch 75 mcg  75 mcg Transdermal Q72H Kennedy BuckerMichael Menz, MD   75 mcg at 08/02/16 0853  . ferrous sulfate tablet 650 mg  650 mg Oral Q breakfast Kennedy BuckerMichael Menz, MD   650 mg at 08/02/16 0854  . fluticasone (FLONASE) 50 MCG/ACT nasal spray 1 spray  1 spray Each Nare BID Kennedy BuckerMichael Menz, MD   1 spray at 08/02/16 0910  . gabapentin (NEURONTIN) capsule 1,200 mg  1,200 mg Oral TID Kennedy BuckerMichael Menz, MD   1,200 mg at 08/02/16 0901  . HYDROcodone-acetaminophen (NORCO) 10-325 MG per tablet 1-2 tablet  1-2 tablet Oral Q4H PRN Kennedy BuckerMichael Menz, MD   2 tablet at 08/02/16 0543  . HYDROmorphone (DILAUDID) injection 1 mg  1 mg Intravenous Q2H PRN Kennedy BuckerMichael Menz, MD   1 mg at 08/02/16 0425  . insulin aspart (novoLOG) injection 0-15 Units  0-15 Units Subcutaneous TID WC Kennedy BuckerMichael Menz, MD   3 Units at 08/02/16 0859  . magnesium citrate solution 1 Bottle  1 Bottle Oral Once PRN Kennedy BuckerMichael Menz, MD      . magnesium hydroxide (MILK OF MAGNESIA) suspension 30 mL  30 mL Oral Daily PRN Kennedy BuckerMichael Menz, MD   30 mL at 08/01/16 [id number]  . magnesium oxide (MAG-OX) tablet 400 mg  400 mg Oral Q1400 Kennedy BuckerMichael Menz, MD   400 mg at 08/01/16 1301  . menthol-cetylpyridinium (CEPACOL) lozenge 3 mg  1 lozenge Oral PRN Kennedy BuckerMichael Menz, MD       Or  . phenol [facility](CHLORASEPTIC) mouth spray 1 spray  1 spray Mouth/Throat PRN Kennedy BuckerMichael Menz, MD      . metFORMIN (GLUCOPHAGE) tablet 1,000 mg  1,000 mg Oral Q breakfast Kennedy BuckerMichael Menz, MD   1,000 mg at 08/02/16 [id number]  . methocarbamol (ROBAXIN) tablet 500 mg  500 mg Oral Q6H PRN Kennedy BuckerMichael Menz, MD   500 mg at 08/01/16 1702   Or  . methocarbamol (ROBAXIN) 500 mg in dextrose 5 % 50 mL IVPB  500 mg Intravenous Q6H PRN Kennedy BuckerMichael Menz, MD      . metoCLOPramide (REGLAN) tablet 5-10 mg  5-10 mg Oral Q8H PRN Kennedy BuckerMichael Menz, MD   10 mg at 07/30/16 1301   Or  . metoCLOPramide (REGLAN) injection 5-10 mg  5-10 mg Intravenous Q8H  PRN Kennedy BuckerMichael Menz, MD      . nitroGLYCERIN (NITROSTAT) SL tablet 0.4 mg  0.4 mg Sublingual Q5 min PRN Kennedy BuckerMichael Menz, MD      . ondansetron [facility](ZOFRAN) tablet 4 mg  4 mg Oral Q6H PRN Kennedy BuckerMichael Menz, MD  Or  . ondansetron (ZOFRAN) injection 4 mg  4 mg Intravenous Q6H PRN Kennedy BuckerMichael Menz, MD      . oxyCODONE (Oxy IR/ROXICODONE) immediate release tablet 10 mg  10 mg Oral TID Kennedy BuckerMichael Menz, MD   10 mg at 08/02/16 0902  . protein supplement (PREMIER PROTEIN) liquid  11 oz Oral Q breakfast Kennedy BuckerMichael Menz, MD   11 oz at 08/01/16 0944  . traZODone (DESYREL) tablet 100 mg  100 mg Oral QHS Kennedy BuckerMichael Menz, MD   100 mg at 08/01/16 2017  . vitamin B-12 (CYANOCOBALAMIN) tablet 1,000 mcg  1,000 mcg Oral Q1400 Kennedy BuckerMichael Menz, MD   1,000 mcg at 08/01/16 1300  . vitamin E capsule 2,000 Units  2,000 Units Oral Q1400 Kennedy BuckerMichael Menz, MD   2,000 Units at 08/01/16 1554  . zinc sulfate capsule 220 mg  220 mg Oral Q1400 Kennedy BuckerMichael Menz, MD   220 mg at 08/01/16 1300  . zolpidem (AMBIEN) tablet 5 mg  5 mg Oral QHS PRN Kennedy BuckerMichael Menz, MD         Discharge Medications: Please see discharge summary for a list of discharge medications.  Relevant Imaging Results:  Relevant Lab Results:   Additional Information  (SSN: [id number])  Judi CongKaren M Kyrin Garn, LCSW

## 2016-08-02 NOTE — Discharge Summary (Addendum)
Physician Discharge Summary  Subjective: 4 Days Post-Op Procedure(s) (LRB): TOTAL HIP ARTHROPLASTY ANTERIOR APPROACH (Left) Patient reports pain as moderate.   Patient seen in rounds with Dr. Ernest Pine. Patient is well, and has had no acute complaints or problems. She has done very little physical therapy because of her hip pain. Patient is ready to go To rehabilitation for physical therapy.  Physician Discharge Summary  Patient ID: Amy Sweeney MRN: [id number] DOB/AGE: 1951-08-21 65 y.o.  Admit date: 07/30/2016 Discharge date: 08/03/2016 Admission Diagnoses:  Discharge Diagnoses:  Active Problems:   Primary localized osteoarthritis of left hip   Discharged Condition: fair  Hospital Course: The patient is postop day 4 from a left anterior total hip replacement. She has remained stable with her vitals and labs. She has done very little with physical therapy and has only ambulated 2 feet. She has difficulties with transfers. She is going to rehabilitation today after discharge. She is continuing to complain of left hip pain. She will have to have a bowel movement before discharge.  Treatments: surgery:  TOTAL HIP ARTHROPLASTY ANTERIOR APPROACH (Left)  SURGEON: Leitha Schuller, MD  ASSISTANTS: None  ANESTHESIA:   spinal  EBL:  Total I/O In: 800 [I.V.:800] Out: 800 [Urine:200; Blood:600]  BLOOD ADMINISTERED:none  DRAINS: (2) Hemovact drain(s) in the Subcutaneous with  Suction Open   LOCAL MEDICATIONS USED:  MARCAINE     SPECIMEN:  Source of Specimen:  Left femoral head  DISPOSITION OF SPECIMEN:  PATHOLOGY  COUNTS:  YES  TOURNIQUET:  * No tourniquets in log *  IMPLANTS: Medacta AMIS 4 standard stem with 56 mm Mpact DM cup with liner and M 28 mm head  Discharge Exam: Blood pressure 128/65, pulse 77, temperature 98.6 F (37 C), temperature source Oral, resp. rate 17, height 5\' 8"  (1.727 m), weight 115 kg (253 lb 8.5 oz), SpO2 97 %.   Disposition:  01-Home or Self Care     Medication List    STOP taking these medications   Oxycodone HCl 10 MG Tabs     TAKE these medications   acetaminophen 500 MG tablet Commonly known as:  TYLENOL Take 1,500 mg by mouth 3 (three) times daily. With Gabapentin   baclofen 10 MG tablet Commonly known as:  LIORESAL Take 5 mg by mouth 2 (two) times daily.   CALCIUM CITRATE PO Take 2,400 mg by mouth daily at 2 PM.   clonazePAM 0.5 MG tablet Commonly known as:  KLONOPIN Take 0.5 mg by mouth 2 (two) times daily.   denosumab 60 MG/ML Soln injection Commonly known as:  PROLIA Inject 60 mg into the skin every 6 (six) months. Administer in upper arm, thigh, or abdomen   diphenhydrAMINE 25 MG tablet Commonly known as:  SOMINEX Take 100 mg by mouth at bedtime. 50 mg at bedtime, if patient is manic due to BIPOLAR 1 she will take 100 mg at bedtime.   divalproex 500 MG 24 hr tablet Commonly known as:  DEPAKOTE ER Take 500 mg by mouth 3 (three) times daily.   DULoxetine 60 MG capsule Commonly known as:  CYMBALTA Take 120 mg by mouth daily.   ELIQUIS 5 MG Tabs tablet Generic drug:  apixaban Take 5 mg by mouth every 12 (twelve) hours.   fentaNYL 75 MCG/HR Commonly known as:  DURAGESIC - dosed mcg/hr Place 1 patch (75 mcg total) onto the skin every 3 (three) days.   ferrous sulfate 325 (65 FE) MG tablet Take 650 mg by mouth daily  at 2 PM.   fluticasone 50 MCG/ACT nasal spray Commonly known as:  FLONASE Place 1 spray into both nostrils 2 (two) times daily.   gabapentin 400 MG capsule Commonly known as:  NEURONTIN Take 1,200 mg by mouth 3 (three) times daily. With Tylenol 1500 mg   Ginkgo Biloba 60 MG Caps Take 120 mg by mouth daily at 2 PM.   HYDROcodone-acetaminophen 10-325 MG tablet Commonly known as:  NORCO Take 1-2 tablets by mouth every 4 (four) hours as needed (breakthrough pain).   Magnesium Oxide -Mg Supplement 250 MG Tabs Take 500 mg by mouth daily at 2 PM.   Melatonin 1  MG Tabs Take 1 tablet (1 mg total) by mouth at bedtime as needed. What changed:  when to take this   metFORMIN 1000 MG tablet Commonly known as:  GLUCOPHAGE Take 1,000 mg by mouth daily with breakfast.   nitroGLYCERIN 0.4 MG SL tablet Commonly known as:  NITROSTAT Place 0.4 mg under the tongue every 5 (five) minutes as needed. For chest pain.   ONE-A-DAY WOMENS 50 PLUS PO Take 2 tablets by mouth daily at 2 PM.   protein supplement shake Liqd Commonly known as:  PREMIER PROTEIN Take 11 oz by mouth daily with breakfast. CARAMEL OR STRAWBERRY PREFERRED   traZODone 100 MG tablet Commonly known as:  DESYREL Take 1 tablet (100 mg total) by mouth at bedtime.   Vitamin B-12 2500 MCG Subl Place 5,000 mcg under the tongue daily at 2 PM.   Vitamin D3 5000 units Caps Take 50,000 Units by mouth daily at 2 PM.   vitamin E 1000 UNIT capsule Take 2,000 Units by mouth daily at 2 PM.   Zinc 50 MG Tabs Take 100 mg by mouth daily at 2 PM.      Follow-up Information    MENZ,MICHAEL, MD Follow up in 2 week(s).   Specialty:  Orthopedic Surgery Why:  For staple removal Contact information: [address] [facility] [place] [address] [phone]           Signed: Amador CunasGAINES, Rozell Kettlewell [facility] InstituteCHRISTOPHER 08/03/2016, 8:28 AM   Objective: Vital signs in last 24 hours: Temp:  [98.6 F (37 C)-99.1 F (37.3 C)] 98.6 F (37 C) (11/20 0739) Pulse Rate:  [76-89] 77 (11/20 0739) Resp:  [16-18] 17 (11/20 0739) BP: (116-128)/(65-75) 128/65 (11/20 0739) SpO2:  [91 %-97 %] 97 % (11/20 0739)  Intake/Output from previous day:  Intake/Output Summary (Last 24 hours) at 08/03/16 0828 Last data filed at 08/03/16 0810  Gross per 24 hour  Intake              120 ml  Output                0 ml  Net              120 ml    Intake/Output this shift: No intake/output data recorded.  Labs: No results for input(s): HGB in the last 72 hours. No results for input(s): WBC,  RBC, HCT, PLT in the last 72 hours. No results for input(s): NA, K, CL, CO2, BUN, CREATININE, GLUCOSE, CALCIUM in the last 72 hours. No results for input(s): LABPT, INR in the last 72 hours.  EXAM: General - Patient is Alert and Oriented Extremity - Sensation intact distally Dorsiflexion/Plantar flexion intact No cellulitis present Compartment soft Incision - clean, dry, wound VAC in place with good suctioning. Motor Function -  the patient can plantarflex and dorsiflex her  feet. She ambulated 2 feet with physical therapy.  Assessment/Plan: 4 Days Post-Op Procedure(s) (LRB): TOTAL HIP ARTHROPLASTY ANTERIOR APPROACH (Left) Procedure(s) (LRB): TOTAL HIP ARTHROPLASTY ANTERIOR APPROACH (Left) Past Medical History:  Diagnosis Date  . Anxiety   . Arthritis    degenerative artgritis  osteoarthritis  . Bipolar disorder (HCC)   . COPD (chronic obstructive pulmonary disease) (HCC)   . Coronary artery disease   . DDD (degenerative disc disease), cervical   . Diabetes mellitus without complication (HCC)   . Dyspnea   . Family history of adverse reaction to anesthesia    father blood clot after surgery died  . Headache    history of migraines  . History of pulmonary embolus (PE)   . Lower extremity deep venous thrombosis (HCC)   . Lower extremity edema   . 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 Night terrors   . OCD (obsessive compulsive disorder)   . Osteoporosis   . PTSD (post-traumatic stress disorder)    Active Problems:   Primary localized osteoarthritis of left hip  Estimated body mass index is 38.55 kg/m as calculated from the following:   Height as of this encounter: 5\' 8"  (1.727 m).   Weight as of this encounter: 115 kg (253 lb 8.5 oz). Advance diet Up with therapy Discharge to SNF Diet - Regular diet Follow up - in 2 weeks for staple removal. Remove wound vac and apply sterile honeycomb  dressing on 11/23 or 11/24 once battery alarm sounds on wound vac. Entire wound vac can be disposed of in trash. Activity - WBAT Disposition - Rehab Condition Upon Discharge - Stable DVT Prophylaxis - TED hose and Eliquis  Dedra Skeensodd Mundy, PA-C Orthopaedic Surgery 08/03/2016, 8:28 AM

## 2016-08-02 NOTE — Progress Notes (Signed)
Physical Therapy Treatment Patient Details Name: Amy GumRebecca G Sweeney MRN: [id number] DOB: October 14, 1950 Today's Date: 08/02/2016    History of Present Illness Pt admitted for L THR. Pt with history of R TKA and has been limited in mobiity since that time.    PT Comments    Agrees reluctantly on 3rd attempt.  Participated in exercises as described below.  Pt did allow for some increased AAROM today but remains limited.  To edge of bed.  Pt initially resisting attempts to help transition to increase comfort.  She was able to get to a mostly upright position making very small slow movements with assist to manage LLE.  When almost upright using rails she yelled and returned to supine and began crying.  Educated pt on proper mechanics and to allow staff to help to increase comfort.  She then agreed to allow help.  To edge of bed with min/mod a x 2 with increased pt comfort and success.  She requested to use bedside commode.  Transferred with mod a x 2 and poor technique sitting before fully turned with poor hand placements.  Once seated on commode she stated it was too small and she wanted a larger one.  A larger one was not immediately available and she did not want to wait until one was obtained from another room and demanded to return to bed.  She was assisted back to bed.  She remains self limiting and is challenging to re-direct her focus for therapy.  Will continue as appropriate.  Refused to remain out of bed this am in recliner.     Follow Up Recommendations  SNF     Equipment Recommendations       Recommendations for Other Services       Precautions / Restrictions Precautions Precautions: Fall;Anterior Hip Restrictions Weight Bearing Restrictions: Yes LLE Weight Bearing: Weight bearing as tolerated    Mobility  Bed Mobility                  Transfers Overall transfer level: Needs assistance Equipment used: Standard walker Transfers: Sit to/from Stand;Stand Pivot  Transfers Sit to Stand: Mod assist;+2 physical assistance Stand pivot transfers: Mod assist;+2 physical assistance       General transfer comment: continues with poor transfer quality and does not accept verbal cues or attemtps to educate to increase safety/ease.  Ambulation/Gait Ambulation/Gait assistance: Mod assist;+2 physical assistance Ambulation Distance (Feet): 2 Feet (to bedside commode and back) Assistive device: Standard walker Gait Pattern/deviations: Step-to pattern   Gait velocity interpretation: <1.8 ft/sec, indicative of risk for recurrent falls General Gait Details: remains generally unsafe.  self limiting   Stairs            Wheelchair Mobility    Modified Rankin (Stroke Patients Only)       Balance Overall balance assessment: Needs assistance Sitting-balance support: Feet supported Sitting balance-Leahy Scale: Good     Standing balance support: Bilateral upper extremity supported Standing balance-Leahy Scale: Fair Standing balance comment: does not stand fully upright                    Cognition Arousal/Alertness: Awake/alert Behavior During Therapy: WFL for tasks assessed/performed;Agitated Overall Cognitive Status: Within Functional Limits for tasks assessed                      Exercises Other Exercises Other Exercises: supine exercises for general ROM to decrease pain prior to mobility for ankle pumps, quad sets, glut  squeeze, heel slides and ab/adduction    General Comments        Pertinent Vitals/Pain Pain Assessment: 0-10 Pain Score: 7  Pain Location: L hip Pain Descriptors / Indicators: Operative site guarding;Crying;Sharp    Home Living                      Prior Function            PT Goals (current goals can now be found in the care plan section) Progress towards PT goals: Not progressing toward goals - comment (self limiting)    Frequency    BID      PT Plan Current plan remains  appropriate    Co-evaluation             End of Session Equipment Utilized During Treatment: Gait belt Activity Tolerance: Patient limited by pain Patient left: in bed;with call bell/phone within reach;with bed alarm set;with nursing/sitter in room;with SCD's reapplied     Time: 1025-1053 PT Time Calculation (min) (ACUTE ONLY): 28 min  Charges:  $Therapeutic Exercise: 8-22 mins $Therapeutic Activity: 8-22 mins                    G Codes:      Danielle DessSarah Currie Dennin 08/02/2016, 11:06 AM

## 2016-08-02 NOTE — Progress Notes (Addendum)
Subjective: 3 Days Post-Op Procedure(s) (LRB): TOTAL HIP ARTHROPLASTY ANTERIOR APPROACH (Left) Patient reports pain as moderate.  Still working on pain control. Patient seen in rounds with Dr. Ernest PineHooten. Patient is well, and has had no acute complaints or problems Plan is to go to Rehab after hospital stay. Negative for chest pain and shortness of breath Fever: no Gastrointestinal: Negative for nausea and vomiting  Objective: Vital signs in last 24 hours: Temp:  [98.6 F (37 C)-100.8 F (38.2 C)] 99.5 F (37.5 C) (11/19 0343) Pulse Rate:  [75-104] 75 (11/19 0343) Resp:  [18-19] 19 (11/19 0343) BP: (117-132)/(71-73) 132/73 (11/19 0343) SpO2:  [90 %-98 %] 97 % (11/19 0343)  Intake/Output from previous day:  Intake/Output Summary (Last 24 hours) at 08/02/16 0640 Last data filed at 08/01/16 1800  Gross per 24 hour  Intake              480 ml  Output                0 ml  Net              480 ml    Intake/Output this shift: No intake/output data recorded.  Labs:  Recent Labs  07/30/16 1210 07/31/16 0444  HGB 11.6* 10.1*    Recent Labs  07/30/16 1210  WBC 8.6  RBC 3.75*  HCT 36.2  PLT 254    Recent Labs  07/30/16 1210 07/31/16 0444  NA  --  137  K  --  4.3  CL  --  99*  CO2  --  33*  BUN  --  9  CREATININE 0.74 0.61  GLUCOSE  --  147*  CALCIUM  --  8.2*   No results for input(s): LABPT, INR in the last 72 hours.   EXAM General - Patient is Alert and Oriented Extremity - Dorsiflexion/Plantar flexion intact No cellulitis present Compartment is soft Dressing/Incision - clean, dry, no drainage with the wound VAC in place Motor Function - intact, moving foot and toes well on exam. The patient Ambulated 2 feet with physical therapy yesterday.  Past Medical History:  Diagnosis Date  . Anxiety   . Arthritis    degenerative artgritis  osteoarthritis  . Bipolar disorder (HCC)   . COPD (chronic obstructive pulmonary disease) (HCC)   . Coronary artery  disease   . DDD (degenerative disc disease), cervical   . Diabetes mellitus without complication (HCC)   . Dyspnea   . Family history of adverse reaction to anesthesia    father blood clot after surgery died  . Headache    history of migraines  . History of pulmonary embolus (PE)   . Lower extremity deep venous thrombosis (HCC)   . Lower extremity edema   . 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 Night terrors   . OCD (obsessive compulsive disorder)   . Osteoporosis   . PTSD (post-traumatic stress disorder)     Assessment/Plan: 3 Days Post-Op Procedure(s) (LRB): TOTAL HIP ARTHROPLASTY ANTERIOR APPROACH (Left) Active Problems:   Primary localized osteoarthritis of left hip  Estimated body mass index is 38.55 kg/m as calculated from the following:   Height as of this encounter: 5\' 8"  (1.727 m).   Weight as of this encounter: 115 kg (253 lb 8.5 oz). Advance diet Up with therapy D/C IV fluids We discussed for physical therapy and herContinued lack of effort. The patient will need a bowel movement  before discharge. Plan to discharge to rehabilitation today.  DVT Prophylaxis - Foot Pumps, TED hose and Eliquis Weight-Bearing as tolerated to left leg  Dedra Skeensodd Litzi Binning, PA-C Orthopaedic Surgery 08/02/2016, 6:40 AM

## 2016-08-02 NOTE — Clinical Social Work Note (Signed)
Clinical Social Work Assessment  Patient Details  Name: Amy Sweeney MRN: [id number] Date of Birth: 08/31/1951  Date of referral:  08/02/16               Reason for consult:  Facility Placement                Permission sought to share information with:  [facility] Contact Representative Permission granted to share information::  Yes, Verbal Permission Granted  Name::        Agency::     Relationship::     Contact Information:     Housing/Transportation Living arrangements for the past 2 months:  Single Family Home Source of Information:  Patient Patient Interpreter Needed:  None Criminal Activity/Legal Involvement Pertinent to Current Situation/Hospitalization:  No - Comment as needed Significant Relationships:  Spouse Lives with:  Spouse Do you feel safe going back to the place where you live?  Yes Need for family participation in patient care:  No (Coment)  Care giving concerns:  STR   Social Worker assessment / plan:  CSW visited patient and her husband at bedside to discuss dc planning. Patient indicated that she would [facility] but does not want [facility] or [facility]. The patient's husband indicated that he would prefer a facility in [place].  PT rec is for SNF.  Employment status:  Retired Health and safety inspectornsurance information:  Medicare PT Recommendations:  Skilled Nursing Facility Information / Referral to community resources:  Skilled Nursing Facility  Patient/Family's Response to care:  Patient and husband thanked CSW for assistance.  Patient/Family's Understanding of and Emotional Response to Diagnosis, Current Treatment, and Prognosis:  Patient understood and agreed with dc plan.  Emotional Assessment Appearance:  Appears stated age Attitude/Demeanor/Rapport:   (Appropriate) Affect (typically observed):  Appropriate Orientation:  Oriented to Self, Oriented to Place, Oriented to  Time, Oriented to Situation Alcohol / Substance use:  Never Used Psych  involvement (Current and /or in the community):  No (Comment)  Discharge Needs  Concerns to be addressed:  Discharge Planning Concerns Readmission within the last 30 days:  Yes Current discharge risk:  None Barriers to Discharge:  Continued Medical Work up, No SNF bed   Judi CongKaren M Genevia Bouldin, LCSW 08/02/2016, 2:52 PM

## 2016-08-03 LAB — GLUCOSE, CAPILLARY
GLUCOSE-CAPILLARY: 177 mg/dL — AB (ref 65–99)
Glucose-Capillary: 197 mg/dL — ABNORMAL HIGH (ref 65–99)

## 2016-08-03 LAB — SURGICAL PATHOLOGY

## 2016-08-03 MED ORDER — HYDROCODONE-ACETAMINOPHEN 10-325 MG PO TABS: 1.0000 | ORAL_TABLET | ORAL | 0 refills | Status: DC | PRN

## 2016-08-03 NOTE — Progress Notes (Signed)
Patient is medically stable for D/C today. Clinical Social Worker (CSW) met with patient and her husband Eddie Dibbles was at bedside. CSW presented bed offers. Patient chose [facility]. Per Seth Bake admissions coordinator at [facility] patient will go to room 329. RN will call report at [phone] and arrange EMS for transport. CSW sent D/C orders to [facility] via [facility]. Patient and her husband Eddie Dibbles are aware of above. Please reconsult if future social work needs arise. CSW signing off.   [employer], LCSW [phone]

## 2016-08-03 NOTE — Progress Notes (Signed)
[place] visited Pt. Pt was with her husband in the room. Pt was getting ready to be moved to a therapy facility. Pt's husband was in [employer], he talked to the [facility] about his experience in [employer] and places he had been to and the people he met. Pt told Ch, she is not pain tolerant and requested for prayers for healing and transportation to the therapy facility, which the [facility], provided for them.    08/03/16 1400  Clinical Encounter Type  Visited With Patient;Patient and family together  Visit Type Initial;Spiritual support  Referral From Nurse  Consult/Referral To Chaplain  Spiritual Encounters  Spiritual Needs Prayer;Emotional;Other (Comment)

## 2016-08-03 NOTE — Care Management Important Message (Signed)
Important Message  Patient Details  Name: Amy GumRebecca G Sweeney MRN: [id number] Date of Birth: [birth year]   Medicare Important Message Given:  Yes    Marily MemosLisa M Pierre Dellarocco, RN 08/03/2016, 9:05 AM

## 2016-08-03 NOTE — Progress Notes (Signed)
   Subjective: 4 Days Post-Op Procedure(s) (LRB): TOTAL HIP ARTHROPLASTY ANTERIOR APPROACH (Left) Patient reports pain as moderate.   Patient is well, and has had no acute complaints or problems Denies any CP, SOB, ABD pain. Large BM 1 day ago We will continue therapy today. Slow progress. Plan is to go Skilled nursing facility after hospital stay.  Objective: Vital signs in last 24 hours: Temp:  [98.6 F (37 C)-99.1 F (37.3 C)] 98.6 F (37 C) (11/20 0739) Pulse Rate:  [76-89] 77 (11/20 0739) Resp:  [16-18] 17 (11/20 0739) BP: (116-128)/(65-75) 128/65 (11/20 0739) SpO2:  [91 %-97 %] 97 % (11/20 0739)  Intake/Output from previous day: 11/19 0701 - 11/20 0700 In: 120 [P.O.:120] Out: 0  Intake/Output this shift: No intake/output data recorded.  No results for input(s): HGB in the last 72 hours. No results for input(s): WBC, RBC, HCT, PLT in the last 72 hours. No results for input(s): NA, K, CL, CO2, BUN, CREATININE, GLUCOSE, CALCIUM in the last 72 hours. No results for input(s): LABPT, INR in the last 72 hours.  EXAM General - Patient is Alert, Appropriate and Oriented. Appears comfortable and in no distress Extremity - Neurovascular intact Sensation intact distally Intact pulses distally Dorsiflexion/Plantar flexion intact No cellulitis present Compartment soft Dressing - dressing C/D/I and wound vac intact Motor Function - intact, moving foot and toes well on exam.   Past Medical History:  Diagnosis Date  . Anxiety   . Arthritis    degenerative artgritis  osteoarthritis  . Bipolar disorder (HCC)   . COPD (chronic obstructive pulmonary disease) (HCC)   . Coronary artery disease   . DDD (degenerative disc disease), cervical   . Diabetes mellitus without complication (HCC)   . Dyspnea   . Family history of adverse reaction to anesthesia    father blood clot after surgery died  . Headache    history of migraines  . History of pulmonary embolus (PE)   . Lower  extremity deep venous thrombosis (HCC)   . Lower extremity edema   . 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 Night terrors   . OCD (obsessive compulsive disorder)   . Osteoporosis   . PTSD (post-traumatic stress disorder)     Assessment/Plan:   4 Days Post-Op Procedure(s) (LRB): TOTAL HIP ARTHROPLASTY ANTERIOR APPROACH (Left) Active Problems:   Primary localized osteoarthritis of left hip  Estimated body mass index is 38.55 kg/m as calculated from the following:   Height as of this encounter: 5\' 8"  (1.727 m).   Weight as of this encounter: 115 kg (253 lb 8.5 oz). Discharge to SNF  [facility] with [facility] ortho in 2 weeks Remove wound vac and apply honeycomb dressing 11/23 or 11/24 once battery on wound vac dies.  DVT Prophylaxis - Foot Pumps, TED hose and Eliquis Weight-Bearing as tolerated to Left leg   T. [place] Neighborhris Gaines, PA-C [facility] 08/03/2016, 8:22 AM

## 2016-08-03 NOTE — Progress Notes (Signed)
Physical Therapy Treatment Patient Details Name: Eliseo GumRebecca G Dresser MRN: [id number] DOB: 09/03/1951 Today's Date: 08/03/2016    History of Present Illness Pt admitted for L THR. Pt with history of R TKA and has been limited in mobiity since that time.    PT Comments    Pt reluctant to participate with PT; demonstrates argumentative and avoidance/distractive behaviors throughout session. Pt refuses all PT except supine bed exercises requiring increased time to perform. PT to discharge ot skilled nursing facility today.   Follow Up Recommendations  SNF     Equipment Recommendations  3in1 (PT)    Recommendations for Other Services       Precautions / Restrictions Precautions Precautions: Fall;Anterior Hip Restrictions Weight Bearing Restrictions: Yes LLE Weight Bearing: Weight bearing as tolerated    Mobility  Bed Mobility               General bed mobility comments: Refused out of bed or up to edge of bed  Transfers                    Ambulation/Gait                 Stairs            Wheelchair Mobility    Modified Rankin (Stroke Patients Only)       Balance                                    Cognition Arousal/Alertness: Awake/alert Behavior During Therapy: Agitated;WFL for tasks assessed/performed (attempts to be argumentative) Overall Cognitive Status: Within Functional Limits for tasks assessed                      Exercises Total Joint Exercises Ankle Circles/Pumps: AROM;Both;Supine;Other (comment) (30) Quad Sets: Strengthening;Both;20 reps;Supine Gluteal Sets: Strengthening;Both;20 reps;Supine Towel Squeeze: Strengthening;Both;20 reps;Supine Short Arc Quad: AROM;Both;20 reps;Supine Heel Slides: AAROM;Left;20 reps;Supine (AROM R) Hip ABduction/ADduction: AAROM;Left;20 reps;Supine (AROM L) Straight Leg Raises: AAROM;Both;10 reps;Supine (2 sets ) Bridges: 10 reps;Supine (partial range) Other  Exercises Other Exercises: Increased time to perform/takes many breaks and creates many distractions     General Comments        Pertinent Vitals/Pain Pain Assessment: 0-10 Pain Score: 9  Pain Location: L hip/thigh Pain Intervention(s): Limited activity within patient's tolerance;Monitored during session;Premedicated before session    Home Living                      Prior Function            PT Goals (current goals can now be found in the care plan section) Progress towards PT goals: Progressing toward goals (slowly)    Frequency    BID      PT Plan Current plan remains appropriate    Co-evaluation             End of Session   Activity Tolerance: Patient limited by pain Patient left: in bed;with call bell/phone within reach;with bed alarm set     Time: [id number] PT Time Calculation (min) (ACUTE ONLY): 35 min  Charges:  $Therapeutic Exercise: 23-37 mins                    G [place]:      Heidi E Barnes, PTA 08/03/2016, 10:49 AM

## 2016-08-03 NOTE — Clinical Social Work Placement (Signed)
   CLINICAL SOCIAL WORK PLACEMENT  NOTE  Date:  08/03/2016  Patient Details  Name: Eliseo GumRebecca G Santalucia MRN: [id number] Date of Birth: 1951/07/01  Clinical Social Work is seeking post-discharge placement for this patient at the Skilled  Nursing Facility level of care (*CSW will initial, date and re-position this form in  chart as items are completed):  Yes   Patient/family provided with [place] Clinical Social Work Department's list of facilities offering this level of care within the geographic area requested by the patient (or if unable, by the patient's family).  Yes   Patient/family informed of their freedom to choose among providers that offer the needed level of care, that participate in Medicare, Medicaid or managed care program needed by the patient, have an available bed and are willing to accept the patient.  Yes   Patient/family informed of [facility]'s ownership interest in [facility] and [facility], as well as of the fact that they are under no obligation to receive care at these facilities.  PASRR submitted to EDS on       PASRR number received on       Existing PASRR number confirmed on 08/02/16     FL2 transmitted to all facilities in geographic area requested by pt/family on 08/02/16     FL2 transmitted to all facilities within larger geographic area on       Patient informed that his/her managed care company has contracts with or will negotiate with certain facilities, including the following:        Yes   Patient/family informed of bed offers received.  Patient chooses bed at  [facility] )     Physician recommends and patient chooses bed at      Patient to be transferred to  [facility] ) on 08/03/16.  Patient to be transferred to facility by  [facility] )     Patient family notified on 08/03/16 of transfer.  Name of family member notified:   (Patient's husband Renae Fickleaul is at bedside and aware of D/C today. )     PHYSICIAN        Additional Comment:    _______________________________________________ Eduard Penkala, Darleen CrockerBailey M, LCSW 08/03/2016, 10:56 AM

## 2016-08-03 NOTE — [place] FL2 (Signed)
[place] MEDICAID FL2 LEVEL OF CARE SCREENING TOOL     IDENTIFICATION  Patient Name: Amy GumRebecca G Hottinger Birthdate: Apr 15, 1951 Sex: female Admission Date (Current Location): 07/30/2016  [place]   Facility and Address:  [facility], [address], [place], [place] [address]      Provider Number: [id number]  Attending Physician Name and Address:  Kennedy BuckerMichael Menz, MD  Relative Name and Phone Number:       Current Level of Care: Hospital Recommended Level of Care: Skilled Nursing Facility Prior Approval Number:    Date Approved/Denied:   PASRR Number:  [id number] )  Discharge Plan: SNF    Current Diagnoses: Patient Active Problem List   Diagnosis Date Noted  . Primary localized osteoarthritis of left hip 07/30/2016  . History of DVT (deep vein thrombosis) 07/16/2016  . Traumatic arthritis of knee 05/30/2015  B12 deficiency COPD  Coronary Artery Disease DDD (degenerative disc disease) Diabetes mellitus type 2, uncomplicated  Orientation RESPIRATION BLADDER Height & Weight     Self, Time, Situation, Place  Normal Continent Weight: 253 lb 8.5 oz (115 kg) Height:  5\' 8"  (172.7 cm)  BEHAVIORAL SYMPTOMS/MOOD NEUROLOGICAL BOWEL NUTRITION STATUS   (none)  (none) Continent Diet (Diet: Carb Modified )  AMBULATORY STATUS COMMUNICATION OF NEEDS Skin   Extensive Assist Verbally Surgical wounds, Wound Vac (Incision: Left Hip. (Provena Wound Vac, Disposable) )                       Personal Care Assistance Level of Assistance  Bathing, Feeding, Dressing Bathing Assistance: Limited assistance Feeding assistance: Independent Dressing Assistance: Limited assistance Total Care Assistance: Maximum assistance   Functional Limitations Info  Sight, Hearing, Speech Sight Info: Adequate Hearing Info: Adequate Speech Info: Adequate    SPECIAL CARE FACTORS FREQUENCY  PT (By licensed PT), OT (By licensed OT)     PT  Frequency:  (5) OT Frequency:  (5)            Contractures Contractures Info: Present    Additional Factors Info  Code Status, Allergies, Insulin Sliding Scale Code Status Info:  (Full Code. ) Allergies Info:  (Bee Venom, Morphine And Related, Adhesive Tape, Celebrex, Celecoxib, Erythromycin, Tegaderm Ag Mesh Silver, Grape Seed, Levaquin, Levofloxacin In D5w)   Insulin Sliding Scale Info:  (NovoLog Insulin Injections 3 times per day. )       Current Medications (08/03/2016):  This is the current hospital active medication list Current Facility-Administered Medications  Medication Dose Route Frequency Provider Last Rate Last Dose  . 0.9 %  sodium chloride infusion   Intravenous Continuous Kennedy BuckerMichael Menz, MD   Stopped at 07/31/16 1647  . acetaminophen (TYLENOL) tablet 650 mg  650 mg Oral Q6H PRN Kennedy BuckerMichael Menz, MD   650 mg at 08/03/16 0831   Or  . acetaminophen (TYLENOL) suppository 650 mg  650 mg Rectal Q6H PRN Kennedy BuckerMichael Menz, MD      . alum & mag hydroxide-simeth (MAALOX/MYLANTA) 200-200-20 MG/5ML suspension 30 mL  30 mL Oral Q4H PRN Kennedy BuckerMichael Menz, MD      . apixaban Everlene Balls(ELIQUIS) tablet 5 mg  5 mg Oral Q12H Kennedy BuckerMichael Menz, MD   5 mg at 08/03/16 0830  . baclofen (LIORESAL) tablet 5 mg  5 mg Oral BID Kennedy BuckerMichael Menz, MD   5 mg at 08/03/16 0831  . bisacodyl (DULCOLAX) suppository 10 mg  10 mg Rectal Daily PRN Kennedy BuckerMichael Menz, MD      .  calcium citrate (CALCITRATE - dosed in mg elemental calcium) tablet 2,375 mg  2,375 mg Oral Q1400 Kennedy Bucker, MD   2,375 mg at 08/02/16 1335  . cholecalciferol (VITAMIN D) tablet 5,000 Units  5,000 Units Oral Q1400 Kennedy Bucker, MD   5,000 Units at 08/02/16 1335  . clonazePAM (KLONOPIN) tablet 0.5 mg  0.5 mg Oral BID Kennedy Bucker, MD   0.5 mg at 08/03/16 0831  . [START ON 12/28/2016] denosumab (PROLIA) injection 60 mg  60 mg Subcutaneous Q6 months Kennedy Bucker, MD      . diphenhydrAMINE (BENADRYL) tablet 50 mg  50 mg Oral QHS Kennedy Bucker, MD   50 mg at 08/02/16 2135  .  divalproex (DEPAKOTE ER) 24 hr tablet 1,500 mg  1,500 mg Oral Daily Dedra Skeens, PA-C   1,500 mg at 08/03/16 0831  . DULoxetine (CYMBALTA) DR capsule 120 mg  120 mg Oral Daily Kennedy Bucker, MD   120 mg at 08/03/16 0831  . fentaNYL (DURAGESIC - dosed mcg/hr) patch 75 mcg  75 mcg Transdermal Q72H Kennedy Bucker, MD   75 mcg at 08/02/16 0853  . ferrous sulfate tablet 650 mg  650 mg Oral Q breakfast Kennedy Bucker, MD   650 mg at 08/03/16 0831  . fluticasone (FLONASE) 50 MCG/ACT nasal spray 1 spray  1 spray Each Nare BID Kennedy Bucker, MD   1 spray at 08/03/16 [phone]  . gabapentin (NEURONTIN) capsule 1,200 mg  1,200 mg Oral TID Kennedy Bucker, MD   1,200 mg at 08/03/16 0831  . HYDROcodone-acetaminophen (NORCO) 10-325 MG per tablet 1-2 tablet  1-2 tablet Oral Q4H PRN Kennedy Bucker, MD   2 tablet at 08/03/16 9604  . HYDROmorphone (DILAUDID) injection 1 mg  1 mg Intravenous Q2H PRN Kennedy Bucker, MD   1 mg at 08/03/16 0416  . insulin aspart (novoLOG) injection 0-15 Units  0-15 Units Subcutaneous TID WC Kennedy Bucker, MD   3 Units at 08/03/16 0831  . lactulose (CHRONULAC) 10 GM/15ML solution 20 g  20 g Oral BID PRN Donato Heinz, MD      . magnesium citrate solution 1 Bottle  1 Bottle Oral Once PRN Kennedy Bucker, MD      . magnesium hydroxide (MILK OF MAGNESIA) suspension 30 mL  30 mL Oral Daily PRN Kennedy Bucker, MD   30 mL at 08/01/16 5409  . magnesium oxide (MAG-OX) tablet 400 mg  400 mg Oral Q1400 Kennedy Bucker, MD   400 mg at 08/02/16 1334  . menthol-cetylpyridinium (CEPACOL) lozenge 3 mg  1 lozenge Oral PRN Kennedy Bucker, MD       Or  . phenol [facility]) mouth spray 1 spray  1 spray Mouth/Throat PRN Kennedy Bucker, MD      . metFORMIN (GLUCOPHAGE) tablet 1,000 mg  1,000 mg Oral Q breakfast Kennedy Bucker, MD   1,000 mg at 08/03/16 0830  . methocarbamol (ROBAXIN) tablet 500 mg  500 mg Oral Q6H PRN Kennedy Bucker, MD   500 mg at 08/01/16 1702   Or  . methocarbamol (ROBAXIN) 500 mg in dextrose 5 % 50 mL IVPB  500 mg  Intravenous Q6H PRN Kennedy Bucker, MD      . metoCLOPramide (REGLAN) tablet 5-10 mg  5-10 mg Oral Q8H PRN Kennedy Bucker, MD   10 mg at 07/30/16 1301   Or  . metoCLOPramide (REGLAN) injection 5-10 mg  5-10 mg Intravenous Q8H PRN Kennedy Bucker, MD      . nitroGLYCERIN (NITROSTAT) SL tablet 0.4 mg  0.4 mg Sublingual Q5 min PRN Kennedy BuckerMichael Menz, MD      . ondansetron [facility](ZOFRAN) tablet 4 mg  4 mg Oral Q6H PRN Kennedy BuckerMichael Menz, MD       Or  . ondansetron [facility](ZOFRAN) injection 4 mg  4 mg Intravenous Q6H PRN Kennedy BuckerMichael Menz, MD      . oxyCODONE (Oxy IR/ROXICODONE) immediate release tablet 10 mg  10 mg Oral TID Kennedy BuckerMichael Menz, MD   10 mg at 08/03/16 0831  . protein supplement (PREMIER PROTEIN) liquid  11 oz Oral Q breakfast Kennedy BuckerMichael Menz, MD   11 oz at 08/03/16 [phone]  . senna-docusate (Senokot-S) tablet 1 tablet  1 tablet Oral BID Donato HeinzJames P Hooten, MD   1 tablet at 08/03/16 0830  . traZODone (DESYREL) tablet 100 mg  100 mg Oral QHS Kennedy BuckerMichael Menz, MD   100 mg at 08/02/16 2135  . vitamin B-12 (CYANOCOBALAMIN) tablet 1,000 mcg  1,000 mcg Oral Q1400 Kennedy BuckerMichael Menz, MD   1,000 mcg at 08/02/16 1334  . vitamin E capsule 2,000 Units  2,000 Units Oral Q1400 Kennedy BuckerMichael Menz, MD   2,000 Units at 08/02/16 1755  . zinc sulfate capsule 220 mg  220 mg Oral Q1400 Kennedy BuckerMichael Menz, MD   220 mg at 08/02/16 1334  . zolpidem (AMBIEN) tablet 5 mg  5 mg Oral QHS PRN Kennedy BuckerMichael Menz, MD         Discharge Medications: Please see discharge summary for a list of discharge medications.  Relevant Imaging Results:  Relevant Lab Results:   Additional Information  (SSN: [id number])  Jastin Fore, Darleen CrockerBailey M, LCSW

## 2016-08-03 NOTE — Progress Notes (Signed)
Pt being discharged to SNF today. Report called to RN, all questions answered. PIV removed. Discharge instructions reviewed with pt, all questions answered. Prescriptions sent with pt and are included in discharge packet sent with pt. She is leaving with all her belongings pt and family are aware and in agreement with plan. She will be transported via EMS.

## 2016-08-04 ENCOUNTER — Encounter: Payer: Self-pay | Admitting: Orthopedic Surgery

## 2016-08-04 DIAGNOSIS — I82502 Chronic embolism and thrombosis of unspecified deep veins of left lower extremity: Secondary | ICD-10-CM | POA: Diagnosis not present

## 2016-08-04 DIAGNOSIS — G894 Chronic pain syndrome: Secondary | ICD-10-CM | POA: Diagnosis not present

## 2016-08-04 DIAGNOSIS — F319 Bipolar disorder, unspecified: Secondary | ICD-10-CM

## 2016-08-04 DIAGNOSIS — M1612 Unilateral primary osteoarthritis, left hip: Secondary | ICD-10-CM | POA: Diagnosis not present

## 2016-08-04 DIAGNOSIS — E1142 Type 2 diabetes mellitus with diabetic polyneuropathy: Secondary | ICD-10-CM | POA: Diagnosis not present

## 2016-08-18 DIAGNOSIS — S71002A Unspecified open wound, left hip, initial encounter: Secondary | ICD-10-CM

## 2016-09-17 ENCOUNTER — Encounter (INDEPENDENT_AMBULATORY_CARE_PROVIDER_SITE_OTHER): Payer: Medicare Other | Admitting: Vascular Surgery

## 2016-09-28 ENCOUNTER — Ambulatory Visit (INDEPENDENT_AMBULATORY_CARE_PROVIDER_SITE_OTHER): Payer: Medicare Other | Admitting: Vascular Surgery

## 2016-09-28 ENCOUNTER — Encounter (INDEPENDENT_AMBULATORY_CARE_PROVIDER_SITE_OTHER): Payer: Self-pay | Admitting: Vascular Surgery

## 2016-09-28 VITALS — BP 127/85 | HR 108 | Resp 17 | Wt 252.0 lb

## 2016-09-28 DIAGNOSIS — Z86718 Personal history of other venous thrombosis and embolism: Secondary | ICD-10-CM | POA: Diagnosis not present

## 2016-09-28 DIAGNOSIS — D6859 Other primary thrombophilia: Secondary | ICD-10-CM | POA: Diagnosis not present

## 2016-09-28 DIAGNOSIS — M12561 Traumatic arthropathy, right knee: Secondary | ICD-10-CM

## 2016-09-28 DIAGNOSIS — M150 Primary generalized (osteo)arthritis: Secondary | ICD-10-CM | POA: Diagnosis not present

## 2016-09-28 DIAGNOSIS — M1612 Unilateral primary osteoarthritis, left hip: Secondary | ICD-10-CM | POA: Diagnosis not present

## 2016-09-28 DIAGNOSIS — M1990 Unspecified osteoarthritis, unspecified site: Secondary | ICD-10-CM | POA: Insufficient documentation

## 2016-09-28 DIAGNOSIS — M159 Polyosteoarthritis, unspecified: Secondary | ICD-10-CM

## 2016-09-28 NOTE — Progress Notes (Signed)
MRN : [id number]  Amy Sweeney is a 66 y.o. (1950/10/06) female who presents with chief complaint of  Chief Complaint  Patient presents with  . Follow-up  .  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  DVT was identified years ago and was treated with anticoagulation.  The presenting symptoms were pain and swelling in the lower extremity.  In preparation for left total hip arthroplasty an IVC filter was inserted on 07/21/2016.  Hip surgery was done on 07/30/2016 and went well.  She is now returning to discuss IVC filter removal.  She is currently taking Eliquis.  The patient notes the leg continues to be mildly painful with dependency and still swells quite a bite.  Symptoms are much better with elevation.  The patient notes minimal edema in the morning which steadily worsens throughout the day.    The patient has not been using compression therapy at this point.  No SOB or pleuritic chest pains.  No cough or hemoptysis.  No blood per rectum or blood in any sputum.  No excessive bruising per the patient.     Current Meds  Medication Sig  . acetaminophen (TYLENOL) 500 MG tablet Take 1,500 mg by mouth 3 (three) times daily. With Gabapentin  . apixaban (ELIQUIS) 5 MG TABS tablet Take 5 mg by mouth every 12 (twelve) hours.   . baclofen (LIORESAL) 10 MG tablet Take 5 mg by mouth 2 (two) times daily.   Marland Kitchen. CALCIUM CITRATE PO Take 2,400 mg by mouth daily at 2 PM.   . Cholecalciferol (VITAMIN D3) 5000 UNITS CAPS Take 50,000 Units by mouth daily at 2 PM.   . clonazePAM (KLONOPIN) 0.5 MG tablet Take 0.5 mg by mouth 2 (two) times daily.  . Cyanocobalamin (VITAMIN B-12) 2500 MCG SUBL Place 5,000 mcg under the tongue daily at 2 PM.   . denosumab (PROLIA) 60 MG/ML SOLN injection Inject 60 mg into the skin every 6 (six) months. Administer in upper arm, thigh, or abdomen  . diphenhydrAMINE  (SOMINEX) 25 MG tablet Take 100 mg by mouth at bedtime. 50 mg at bedtime, if patient is manic due to BIPOLAR 1 she will take 100 mg at bedtime.  . divalproex (DEPAKOTE ER) 500 MG 24 hr tablet Take 500 mg by mouth 3 (three) times daily.  . DULoxetine (CYMBALTA) 60 MG capsule Take 120 mg by mouth daily.   . fentaNYL (DURAGESIC - DOSED MCG/HR) 75 MCG/HR Place 1 patch (75 mcg total) onto the skin every 3 (three) days.  . ferrous sulfate 325 (65 FE) MG tablet Take 650 mg by mouth daily at 2 PM.   . fluticasone (FLONASE) 50 MCG/ACT nasal spray Place 1 spray into both nostrils 2 (two) times daily.   Marland Kitchen. gabapentin (NEURONTIN) 400 MG capsule Take 1,200 mg by mouth 3 (three) times daily. With Tylenol 1500 mg  . Ginkgo Biloba 60 MG CAPS Take 120 mg by mouth daily at 2 PM.   . HYDROcodone-acetaminophen (NORCO) 10-325 MG tablet Take 1-2 tablets by mouth every 4 (four) hours as needed (breakthrough pain).  . Magnesium Oxide -Mg Supplement 250 MG TABS Take 500 mg by mouth daily at 2 PM.   . Melatonin 1 MG TABS Take 1 tablet (1 mg total) by mouth at bedtime as needed. (Patient taking differently: Take 1 mg by mouth at bedtime. )  . metFORMIN (GLUCOPHAGE) 1000 MG tablet Take  1,000 mg by mouth daily with breakfast.   . Multiple Vitamins-Minerals (ONE-A-DAY WOMENS 50 PLUS PO) Take 2 tablets by mouth daily at 2 PM.   . nitroGLYCERIN (NITROSTAT) 0.4 MG SL tablet Place 0.4 mg under the tongue every 5 (five) minutes as needed. For chest pain.  . protein supplement shake (PREMIER PROTEIN) LIQD Take 11 oz by mouth daily with breakfast. CARAMEL OR STRAW[place]ERRY PREFERRED  . traZODone (DESYREL) 100 MG tablet Take 1 tablet (100 mg total) by mouth at bedtime.  . vitamin E 1000 UNIT capsule Take 2,000 Units by mouth daily at 2 PM.   . Zinc 50 MG TABS Take 100 mg by mouth daily at 2 PM.     Past Medical History:  Diagnosis Date  . Anxiety   . Arthritis    degenerative artgritis  osteoarthritis  . Bipolar disorder (HCC)   .  COPD (chronic obstructive pulmonary disease) (HCC)   . Coronary artery disease   . DDD (degenerative disc disease), cervical   . Diabetes mellitus without complication (HCC)   . Dyspnea   . Family history of adverse reaction to anesthesia    father blood clot after surgery died  . Headache    history of migraines  . History of pulmonary embolus (PE)   . Lower extremity deep venous thrombosis (HCC)   . Lower extremity edema   . 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 Night terrors   . OCD (obsessive compulsive disorder)   . Osteoporosis   . PTSD (post-traumatic stress disorder)     Past Surgical History:  Procedure Laterality Date  . CARDIAC CATHETERIZATION  2004  . CERVICAL LAMINOPLASTY    . CESAREAN SECTION     x2  . CHOLECYSTECTOMY    . DILATION AND CURETTAGE OF UTERUS    . GASTRIC BYPASS  2004  . IVC FILTER PLACEMENT ([facility] HX)    . KNEE ARTHROSCOPY Left 2004 & 2006  . LAMINECTOMY  2014   lumbar 3-6 sacral 1-3  fusion  . NECK SURGERY     removed the buldge of the disc and hardware.  Marland Kitchen PERIPHERAL VASCULAR CATHETERIZATION N/A 05/21/2015   Procedure: IVC Filter Insertion;  Surgeon: Renford Dills, MD;  Location: [facility] INVASIVE CV LAB;  Service: Cardiovascular;  Laterality: N/A;  . PERIPHERAL VASCULAR CATHETERIZATION N/A 07/23/2015   Procedure: IVC Filter Removal;  Surgeon: Renford Dills, MD;  Location: [facility] INVASIVE CV LAB;  Service: Cardiovascular;  Laterality: N/A;  . PERIPHERAL VASCULAR CATHETERIZATION N/A 07/21/2016   Procedure: IVC Filter Insertion;  Surgeon: Renford Dills, MD;  Location: [facility] INVASIVE CV LAB;  Service: Cardiovascular;  Laterality: N/A;  . TOTAL HIP ARTHROPLASTY Left 07/30/2016   Procedure: TOTAL HIP ARTHROPLASTY ANTERIOR APPROACH;  Surgeon: Kennedy Bucker, MD;  Location: [facility] ORS;  Service: Orthopedics;  Laterality: Left;  . TOTAL KNEE ARTHROPLASTY Right 05/30/2015    Procedure: TOTAL KNEE ARTHROPLASTY;  Surgeon: Kennedy Bucker, MD;  Location: [facility] ORS;  Service: Orthopedics;  Laterality: Right;    Social History Social History  Substance Use Topics  . Smoking status: Never Smoker  . Smokeless tobacco: Never Used  . Alcohol use No    Family History No family history on file. No family history of bleeding/clotting disorders, porphyria or autoimmune disease   Allergies  Allergen Reactions  . Bee Venom Anaphylaxis  . Morphine And Related Anaphylaxis    Blisters itching, N&V, face and throat swelling  .  Adhesive [Tape] Other (See Comments)    Itching and water blisters. Paper tape is OK.  . Celebrex [Celecoxib] Swelling  . Erythromycin Rash  . Tegaderm Ag Mesh [Silver] Other (See Comments)    itching and water blisters itching and water blisters  . Grape Seed Itching and Swelling    Lips and mouth swelling/itching.  Grapes not grape seed  . Levaquin [Levofloxacin In D5w] Diarrhea     REVIEW OF SYSTEMS (Negative unless checked)  Constitutional: [] Weight loss  [] Fever  [] Chills Cardiac: [] Chest pain   [] Chest pressure   [] Palpitations   [] Shortness of breath when laying flat   [] Shortness of breath with exertion. Vascular:  [x] Pain in legs with walking   [] Pain in legs at rest  [x] History of DVT   [] Phlebitis   [x] Swelling in legs   [] Varicose veins   [] Non-healing ulcers Pulmonary:   [] Uses home oxygen   [] Productive cough   [] Hemoptysis   [] Wheeze  [] COPD   [] Asthma Neurologic:  [] Dizziness   [] Seizures   [] History of stroke   [] History of TIA  [] Aphasia   [] Vissual changes   [] Weakness or numbness in arm   [] Weakness or numbness in leg Musculoskeletal:   [x] Joint swelling   [x] Joint pain   [] Low back pain Hematologic:  [] Easy bruising  [] Easy bleeding   [] Hypercoagulable state   [] Anemic Gastrointestinal:  [] Diarrhea   [] Vomiting  [] Gastroesophageal reflux/heartburn   [] Difficulty swallowing. Genitourinary:  [] Chronic kidney disease    [] Difficult urination  [] Frequent urination   [] Blood in urine Skin:  [] Rashes   [] Ulcers  Psychological:  [x] History of anxiety   []  History of major depression.  Physical Examination  Vitals:   09/28/16 1610  BP: 127/85  Pulse: (!) 108  Resp: 17  Weight: 252 lb (114.3 kg)   Body mass index is 38.32 kg/m. Gen: WD/WN, NAD; presents in a wheelchair Head: [place]/AT, No temporalis wasting.  Ear/Nose/Throat: Hearing grossly intact, nares w/o erythema or drainage, good dentition Eyes: PER, EOMI, sclera nonicteric.  Neck: Supple, no masses.  No bruit or JVD.  Pulmonary:  Good air movement, clear to auscultation bilaterally, no use of accessory muscles.  Cardiac: RRR, normal S1, S2, no Murmurs. Vascular:  2+ edema of the ankles bilaterally Vessel Right Left  Radial Palpable Palpable  Ulnar Palpable Palpable  Brachial Palpable Palpable  Carotid Palpable Palpable  Femoral Palpable Palpable  Popliteal Palpable Palpable  PT Palpable Palpable  DP Palpable Palpable   Gastrointestinal: soft, non-distended. No guarding/no peritoneal signs.  Musculoskeletal: M/S 5/5 throughout.  Arthritic changes with mild  atrophy.  Neurologic: CN 2-12 intact. Pain and light touch intact in extremities.  Symmetrical.  Speech is fluent. Motor exam as listed above. Psychiatric: Judgment intact, Mood & affect appropriate for pt's clinical situation. Dermatologic: No rashes or ulcers noted.  No changes consistent with cellulitis. Lymph : No Cervical lymphadenopathy, no lichenification or skin changes of chronic lymphedema.  CBC Lab Results  Component Value Date   WBC 8.6 07/30/2016   HGB 10.1 (L) 07/31/2016   HCT 36.2 07/30/2016   MCV 96.7 07/30/2016   PLT 254 07/30/2016    BMET    Component Value Date/Time   NA 137 07/31/2016 0444   NA 141 04/26/2014 0448   K 4.3 07/31/2016 0444   K 4.2 04/26/2014 0448   CL 99 (L) 07/31/2016 0444   CL 101 04/26/2014 0448   CO2 33 (H) 07/31/2016 0444   CO2 30  04/26/2014 0448   GLUCOSE 147 (  H) 07/31/2016 0444   GLUCOSE 162 (H) 04/26/2014 0448   BUN 9 07/31/2016 0444   BUN 17 04/26/2014 0448   CREATININE 0.61 07/31/2016 0444   CREATININE 0.90 04/26/2014 0448   CALCIUM 8.2 (L) 07/31/2016 0444   CALCIUM 8.9 04/26/2014 0448   GFRNONAA >60 07/31/2016 0444   GFRNONAA >60 04/26/2014 0448   GFRAA >60 07/31/2016 0444   GFRAA >60 04/26/2014 0448   CrCl cannot be calculated (Patient's most recent lab result is older than the maximum 21 days allowed.).  COAG Lab Results  Component Value Date   INR 0.96 07/23/2016   INR 1.09 05/15/2015   INR 1.0 04/25/2014    Radiology No results found.   Assessment/Plan 1. History of DVT (deep vein thrombosis) The patient will continue anticoagulation for now as there have not been any problems or complications at this point.  At this time removal of the IVC filter is indicated.  Although it appears that there is a likely hood of more surgery in the future per the patient she is hoping to wait for at least two years.  I have contacted Dr Rosita Kea as well and he does not have any imminent plans for further surgery.  Risk and benefits were reviewed the patient.  Indications for the procedure were reviewed.  All questions were answered, the patient agrees to proceed.   I have reviewed the discussion with the patient regarding DVT and post phlebitic changes such as swelling and why it  causes symptoms such as pain.  The patient is encouraged to wear graduated compression stockings on a daily basis.  In addition, behavioral modification including elevation during the day and avoidance of prolonged dependency will be initiated.    The patient will follow-up with me after filter removal.   A total of 35 minutes was spent with this patient and greater than 50% was spent in counseling and coordination of care with the patient.  Discussion included the treatment options for vascular disease including indications for surgery  and intervention.  Also discussed is the appropriate timing of treatment.  In addition medical therapy was discussed.  2. Hypercoagulable state (HCC) The patient will continue Eliquis.  She is instructed to not take the dose the morning of the procedure only.  The day before the removal she should take the morning and the evening dose.  3. Traumatic arthritis of right knee Plan per Dr Rosita Kea  4. Primary localized osteoarthritis of left hip S/p successful left THR  5. Primary osteoarthritis involving multiple joints Continue NSAIDs and medical therapy as ordered and reviewed.    Levora Dredge, MD  09/28/2016 5:05 PM

## 2016-09-29 ENCOUNTER — Encounter (INDEPENDENT_AMBULATORY_CARE_PROVIDER_SITE_OTHER): Payer: Self-pay

## 2016-09-29 ENCOUNTER — Telehealth (INDEPENDENT_AMBULATORY_CARE_PROVIDER_SITE_OTHER): Payer: Self-pay

## 2016-09-29 NOTE — Telephone Encounter (Signed)
ERROR

## 2016-10-05 ENCOUNTER — Other Ambulatory Visit (INDEPENDENT_AMBULATORY_CARE_PROVIDER_SITE_OTHER): Payer: Self-pay | Admitting: Vascular Surgery

## 2016-10-08 DIAGNOSIS — M62830 Muscle spasm of back: Secondary | ICD-10-CM | POA: Insufficient documentation

## 2016-10-13 MED ORDER — METHYLPREDNISOLONE SODIUM SUCC 125 MG IJ SOLR: 125.0000 mg | INTRAMUSCULAR | Status: DC | PRN

## 2016-10-13 MED ORDER — FAMOTIDINE 20 MG PO TABS: 40.0000 mg | ORAL_TABLET | ORAL | Status: DC | PRN

## 2016-10-13 MED ORDER — CEFAZOLIN IN D5W 1 GM/50ML IV SOLN
1.0000 g | Freq: Once | INTRAVENOUS | Status: AC
  Administered 2016-10-16: 1 g via INTRAVENOUS

## 2016-10-13 MED ORDER — SODIUM CHLORIDE 0.9 % IV SOLN
INTRAVENOUS | Status: DC
  Administered 2016-10-16: 08:00:00 via INTRAVENOUS

## 2016-10-16 ENCOUNTER — Encounter
Admission: RE | Disposition: A | Discharge status: Home / Self Care | Payer: Self-pay | Source: Ambulatory Visit | Attending: Vascular Surgery

## 2016-10-16 ENCOUNTER — Ambulatory Visit
Admission: RE | Admit: 2016-10-16 | Discharge: 2016-10-16 | Disposition: A | Discharge status: Home / Self Care | Payer: Medicare Other | Source: Ambulatory Visit | Attending: Vascular Surgery | Admitting: Vascular Surgery

## 2016-10-16 DIAGNOSIS — F514 Sleep terrors [night terrors]: Secondary | ICD-10-CM | POA: Diagnosis not present

## 2016-10-16 DIAGNOSIS — Z881 Allergy status to other antibiotic agents status: Secondary | ICD-10-CM | POA: Diagnosis not present

## 2016-10-16 DIAGNOSIS — Z9109 Other allergy status, other than to drugs and biological substances: Secondary | ICD-10-CM | POA: Insufficient documentation

## 2016-10-16 DIAGNOSIS — E119 Type 2 diabetes mellitus without complications: Secondary | ICD-10-CM | POA: Diagnosis not present

## 2016-10-16 DIAGNOSIS — Z885 Allergy status to narcotic agent status: Secondary | ICD-10-CM | POA: Diagnosis not present

## 2016-10-16 DIAGNOSIS — Z981 Arthrodesis status: Secondary | ICD-10-CM | POA: Diagnosis not present

## 2016-10-16 DIAGNOSIS — Z86718 Personal history of other venous thrombosis and embolism: Secondary | ICD-10-CM | POA: Diagnosis not present

## 2016-10-16 DIAGNOSIS — Z888 Allergy status to other drugs, medicaments and biological substances status: Secondary | ICD-10-CM | POA: Insufficient documentation

## 2016-10-16 DIAGNOSIS — Z9884 Bariatric surgery status: Secondary | ICD-10-CM | POA: Insufficient documentation

## 2016-10-16 DIAGNOSIS — G43909 Migraine, unspecified, not intractable, without status migrainosus: Secondary | ICD-10-CM | POA: Insufficient documentation

## 2016-10-16 DIAGNOSIS — Z91030 Bee allergy status: Secondary | ICD-10-CM | POA: Diagnosis not present

## 2016-10-16 DIAGNOSIS — M810 Age-related osteoporosis without current pathological fracture: Secondary | ICD-10-CM | POA: Insufficient documentation

## 2016-10-16 DIAGNOSIS — D6859 Other primary thrombophilia: Secondary | ICD-10-CM | POA: Diagnosis not present

## 2016-10-16 DIAGNOSIS — M1612 Unilateral primary osteoarthritis, left hip: Secondary | ICD-10-CM | POA: Diagnosis not present

## 2016-10-16 DIAGNOSIS — Z452 Encounter for adjustment and management of vascular access device: Secondary | ICD-10-CM | POA: Insufficient documentation

## 2016-10-16 DIAGNOSIS — Z96651 Presence of right artificial knee joint: Secondary | ICD-10-CM | POA: Insufficient documentation

## 2016-10-16 DIAGNOSIS — Z91018 Allergy to other foods: Secondary | ICD-10-CM | POA: Diagnosis not present

## 2016-10-16 DIAGNOSIS — R600 Localized edema: Secondary | ICD-10-CM | POA: Insufficient documentation

## 2016-10-16 DIAGNOSIS — F4310 Post-traumatic stress disorder, unspecified: Secondary | ICD-10-CM | POA: Diagnosis not present

## 2016-10-16 DIAGNOSIS — Z96642 Presence of left artificial hip joint: Secondary | ICD-10-CM | POA: Diagnosis not present

## 2016-10-16 DIAGNOSIS — Z9049 Acquired absence of other specified parts of digestive tract: Secondary | ICD-10-CM | POA: Insufficient documentation

## 2016-10-16 DIAGNOSIS — Z7902 Long term (current) use of antithrombotics/antiplatelets: Secondary | ICD-10-CM | POA: Insufficient documentation

## 2016-10-16 DIAGNOSIS — J449 Chronic obstructive pulmonary disease, unspecified: Secondary | ICD-10-CM | POA: Diagnosis not present

## 2016-10-16 HISTORY — PX: PERIPHERAL VASCULAR CATHETERIZATION: SHX172C

## 2016-10-16 LAB — GLUCOSE, CAPILLARY: GLUCOSE-CAPILLARY: 127 mg/dL — AB (ref 65–99)

## 2016-10-16 SURGERY — IVC FILTER REMOVAL
Anesthesia: Moderate Sedation

## 2016-10-16 MED ORDER — HEPARIN (PORCINE) IN NACL 2-0.9 UNIT/ML-% IJ SOLN
INTRAMUSCULAR | Status: AC
  Filled 2016-10-16: qty 500

## 2016-10-16 MED ORDER — ONDANSETRON HCL 4 MG/2ML IJ SOLN: 4.0000 mg | Freq: Four times a day (QID) | INTRAMUSCULAR | Status: DC | PRN

## 2016-10-16 MED ORDER — MIDAZOLAM HCL 5 MG/5ML IJ SOLN
INTRAMUSCULAR | Status: AC
  Filled 2016-10-16: qty 5

## 2016-10-16 MED ORDER — FLUMAZENIL 0.5 MG/5ML IV SOLN
INTRAVENOUS | Status: AC
  Filled 2016-10-16: qty 5

## 2016-10-16 MED ORDER — IOPAMIDOL (ISOVUE-300) INJECTION 61%
INTRAVENOUS | Status: DC | PRN
Start: 2016-10-16 — End: 2016-10-16
  Administered 2016-10-16: 30 mL via INTRAVENOUS

## 2016-10-16 MED ORDER — NALOXONE HCL 2 MG/2ML IJ SOSY
PREFILLED_SYRINGE | INTRAMUSCULAR | Status: AC
  Filled 2016-10-16: qty 2

## 2016-10-16 MED ORDER — HEPARIN SODIUM (PORCINE) 1000 UNIT/ML IJ SOLN
INTRAMUSCULAR | Status: AC
  Filled 2016-10-16: qty 1

## 2016-10-16 MED ORDER — MIDAZOLAM HCL 2 MG/2ML IJ SOLN
INTRAMUSCULAR | Status: DC | PRN
  Administered 2016-10-16: 1 mg via INTRAVENOUS

## 2016-10-16 MED ORDER — FENTANYL CITRATE (PF) 100 MCG/2ML IJ SOLN
INTRAMUSCULAR | Status: AC
  Filled 2016-10-16: qty 2

## 2016-10-16 MED ORDER — FENTANYL CITRATE (PF) 100 MCG/2ML IJ SOLN
INTRAMUSCULAR | Status: DC | PRN
Start: 2016-10-16 — End: 2016-10-16
  Administered 2016-10-16: 12.5 ug via INTRAVENOUS

## 2016-10-16 MED ORDER — LIDOCAINE HCL (PF) 1 % IJ SOLN
INTRAMUSCULAR | Status: AC
  Filled 2016-10-16: qty 30

## 2016-10-16 SURGICAL SUPPLY — 7 items
CATH TORCON 5FR 0.38 (CATHETERS) ×2 IMPLANT
NEEDLE ENTRY 21GA 7CM ECHOTIP (NEEDLE) ×2 IMPLANT
PACK ANGIOGRAPHY (CUSTOM PROCEDURE TRAY) ×2 IMPLANT
SET INTRO CAPELLA COAXIAL (SET/KITS/TRAYS/PACK) ×2 IMPLANT
SET VENACAVA FILTER RETRIEVAL (MISCELLANEOUS) ×2 IMPLANT
TOWEL OR 17X26 4PK STRL BLUE (TOWEL DISPOSABLE) ×2 IMPLANT
WIRE J 3MM .035X145CM (WIRE) ×2 IMPLANT

## 2016-10-16 NOTE — Progress Notes (Signed)
I have contacted [facility] regarding the patient's current pain regime.  I was not connected to Dr Jearld LeschStoia but given to voicemail.  I did leave a message with my contact information.  The patient elected to leave and informed the nurses she will follow up with [facility].

## 2016-10-16 NOTE — H&P (Signed)
  OPERATIVE NOTE   PRE-OPERATIVE DIAGNOSIS: DVT with PE  POST-OPERATIVE DIAGNOSIS: Same  PROCEDURE: 1. Retrieval of IVC Filter 2. Inferior Vena Cavagram  SURGEON: Renford DillsGregory G. Marabella Popiel, M.D.  ANESTHESIA:  Conscious sedation was administered under my direct supervision. IV Versed plus fentanyl were utilized. Continuous ECG, pulse oximetry and blood pressure was monitored throughout the entire procedure. Conscious sedation was for a total of 25 minutes.  ESTIMATED BLOOD LOSS: Minimal cc  FINDING(S):inferior vena cava is widely patent filter is in place in good position. Filter is removed without incident  SPECIMEN(S):  IVC filter intact  INDICATIONS:   Amy Sweeney is a 66 y.o. female who presents with DVT and PE. The patient has now tolerated anticoagulation for several months. Therefore, the IVC filter is recommended to be removed. The risks and benefits were reviewed with the patient all questions were answered and they agreed to proceed with IVC filter retrieval. Oral anticoagulation will be continued.  DESCRIPTION: After obtaining full informed written consent, the patient was brought back to the Special Procedure Suite and placed in the supine position.  The patient received IV antibiotics prior to induction.  After obtaining adequate sedation, the patient was prepped and draped in the standard fashion and appropriate time out is called.     Ultrasound was placed in a sterile sleeve.The right neck was then imaged with ultrasound.   Jugular vein was identified it is echolucent and homogeneous indicating patency. 1% lidocaine is then infiltrated under ultrasound visualization and subsequently a Seldinger needle is inserted under real-time ultrasound guidance.  J-wire is then advanced into the inferior vena cava under fluoroscopic guidance. With the tip of the sheath at the confluence of the iliac veins inferior vena caval imaging is performed.  After review of the image the sheath  is repositioned to above the filter and the snares introduced. Snares opened and the hook is secured without difficulty. The filter is then collapsed within the sheath and removed without difficulty.  Sheath is removed by pressures held the patient tolerated the procedure well and there were no immediate complications.  Interpretation: inferior vena cava is widely patent filter is in place in good position. Filter is removed without incident.     COMPLICATIONS: None  CONDITION: Almon RegisterGood  Horice Carrero G. Lido Maske, M.D. [facility] Office: [phone]   10/16/2016, 9:02 AM

## 2016-10-16 NOTE — H&P (Signed)
[facility] History & Physical Update  The patient was interviewed and re-examined.  The patient's previous History and Physical has been reviewed and is unchanged.  There is no change in the plan of care. We plan to proceed with the scheduled procedure.  Amy DredgeGregory British Moyd, MD  10/16/2016, 8:31 AM

## 2016-10-19 ENCOUNTER — Encounter: Payer: Self-pay | Admitting: Vascular Surgery

## 2016-11-09 ENCOUNTER — Encounter (INDEPENDENT_AMBULATORY_CARE_PROVIDER_SITE_OTHER): Payer: Medicare Other | Admitting: Vascular Surgery

## 2016-11-20 DIAGNOSIS — F5104 Psychophysiologic insomnia: Secondary | ICD-10-CM | POA: Insufficient documentation

## 2016-11-20 DIAGNOSIS — F319 Bipolar disorder, unspecified: Secondary | ICD-10-CM | POA: Insufficient documentation

## 2016-12-15 DIAGNOSIS — Z79891 Long term (current) use of opiate analgesic: Secondary | ICD-10-CM | POA: Insufficient documentation

## 2017-02-01 ENCOUNTER — Other Ambulatory Visit: Payer: Self-pay | Admitting: Internal Medicine

## 2017-02-01 DIAGNOSIS — Z1231 Encounter for screening mammogram for malignant neoplasm of breast: Secondary | ICD-10-CM

## 2017-02-02 ENCOUNTER — Inpatient Hospital Stay: Admission: RE | Admit: 2017-02-02 | Payer: Medicare Other | Source: Ambulatory Visit

## 2017-02-09 ENCOUNTER — Ambulatory Visit: Admission: RE | Admit: 2017-02-09 | Payer: Medicare Other | Source: Ambulatory Visit

## 2017-04-01 ENCOUNTER — Encounter: Payer: Self-pay | Admitting: Emergency Medicine

## 2017-04-01 ENCOUNTER — Inpatient Hospital Stay
Admission: EM | Admit: 2017-04-01 | Discharge: 2017-04-03 | DRG: 918 | Disposition: A | Discharge status: Home / Self Care | Payer: Medicare Other | Attending: Internal Medicine | Admitting: Internal Medicine

## 2017-04-01 DIAGNOSIS — F319 Bipolar disorder, unspecified: Secondary | ICD-10-CM | POA: Diagnosis present

## 2017-04-01 DIAGNOSIS — Z86711 Personal history of pulmonary embolism: Secondary | ICD-10-CM

## 2017-04-01 DIAGNOSIS — E872 Acidosis, unspecified: Secondary | ICD-10-CM

## 2017-04-01 DIAGNOSIS — G894 Chronic pain syndrome: Secondary | ICD-10-CM | POA: Diagnosis present

## 2017-04-01 DIAGNOSIS — Z96642 Presence of left artificial hip joint: Secondary | ICD-10-CM | POA: Diagnosis present

## 2017-04-01 DIAGNOSIS — F4310 Post-traumatic stress disorder, unspecified: Secondary | ICD-10-CM | POA: Diagnosis present

## 2017-04-01 DIAGNOSIS — E875 Hyperkalemia: Secondary | ICD-10-CM | POA: Diagnosis not present

## 2017-04-01 DIAGNOSIS — Z79899 Other long term (current) drug therapy: Secondary | ICD-10-CM

## 2017-04-01 DIAGNOSIS — Z96651 Presence of right artificial knee joint: Secondary | ICD-10-CM | POA: Diagnosis present

## 2017-04-01 DIAGNOSIS — T383X1A Poisoning by insulin and oral hypoglycemic [antidiabetic] drugs, accidental (unintentional), initial encounter: Principal | ICD-10-CM | POA: Diagnosis present

## 2017-04-01 DIAGNOSIS — M1990 Unspecified osteoarthritis, unspecified site: Secondary | ICD-10-CM | POA: Diagnosis present

## 2017-04-01 DIAGNOSIS — I2510 Atherosclerotic heart disease of native coronary artery without angina pectoris: Secondary | ICD-10-CM | POA: Diagnosis present

## 2017-04-01 DIAGNOSIS — M810 Age-related osteoporosis without current pathological fracture: Secondary | ICD-10-CM | POA: Diagnosis present

## 2017-04-01 DIAGNOSIS — E119 Type 2 diabetes mellitus without complications: Secondary | ICD-10-CM

## 2017-04-01 DIAGNOSIS — Z7984 Long term (current) use of oral hypoglycemic drugs: Secondary | ICD-10-CM

## 2017-04-01 DIAGNOSIS — J449 Chronic obstructive pulmonary disease, unspecified: Secondary | ICD-10-CM | POA: Diagnosis present

## 2017-04-01 DIAGNOSIS — T50901A Poisoning by unspecified drugs, medicaments and biological substances, accidental (unintentional), initial encounter: Secondary | ICD-10-CM

## 2017-04-01 DIAGNOSIS — Z7901 Long term (current) use of anticoagulants: Secondary | ICD-10-CM

## 2017-04-01 DIAGNOSIS — Z86718 Personal history of other venous thrombosis and embolism: Secondary | ICD-10-CM | POA: Diagnosis not present

## 2017-04-01 DIAGNOSIS — F419 Anxiety disorder, unspecified: Secondary | ICD-10-CM | POA: Diagnosis present

## 2017-04-01 DIAGNOSIS — F429 Obsessive-compulsive disorder, unspecified: Secondary | ICD-10-CM | POA: Diagnosis present

## 2017-04-01 LAB — COMPREHENSIVE METABOLIC PANEL
ALBUMIN: 3.8 g/dL (ref 3.5–5.0)
ALT: 15 U/L (ref 14–54)
ANION GAP: 12 (ref 5–15)
AST: 26 U/L (ref 15–41)
Alkaline Phosphatase: 63 U/L (ref 38–126)
BUN: 11 mg/dL (ref 6–20)
CALCIUM: 9.2 mg/dL (ref 8.9–10.3)
CO2: 25 mmol/L (ref 22–32)
Chloride: 94 mmol/L — ABNORMAL LOW (ref 101–111)
Creatinine, Ser: 0.74 mg/dL (ref 0.44–1.00)
GFR calc non Af Amer: >60 mL/min (ref >60)
Glucose, Bld: 131 mg/dL — ABNORMAL HIGH (ref 65–99)
Potassium: 4.1 mmol/L (ref 3.5–5.1)
Sodium: 131 mmol/L — ABNORMAL LOW (ref 135–145)
TOTAL PROTEIN: 7.3 g/dL (ref 6.5–8.1)
Total Bilirubin: 0.4 mg/dL (ref 0.3–1.2)

## 2017-04-01 LAB — GLUCOSE, CAPILLARY
GLUCOSE-CAPILLARY: 136 mg/dL — AB (ref 65–99)
GLUCOSE-CAPILLARY: 129 mg/dL — AB (ref 65–99)
Glucose-Capillary: 129 mg/dL — ABNORMAL HIGH (ref 65–99)
Glucose-Capillary: 93 mg/dL (ref 65–99)
Glucose-Capillary: 168 mg/dL — ABNORMAL HIGH (ref 65–99)

## 2017-04-01 LAB — CBC WITH DIFFERENTIAL/PLATELET
BASOS ABS: 0 10*3/uL (ref 0–0.1)
Basophils Relative: 1 %
EOS PCT: 0 %
Eosinophils Absolute: 0 10*3/uL (ref 0–0.7)
HCT: 41.2 % (ref 35.0–47.0)
Hemoglobin: 13.7 g/dL (ref 12.0–16.0)
LYMPHS PCT: 34 %
Lymphs Abs: 2.3 10*3/uL (ref 1.0–3.6)
MCH: 31.2 pg (ref 26.0–34.0)
MCHC: 33.4 g/dL (ref 32.0–36.0)
MCV: 93.4 fL (ref 80.0–100.0)
Monocytes Absolute: 0.4 10*3/uL (ref 0.2–0.9)
Monocytes Relative: 6 %
NEUTROS ABS: 4 10*3/uL (ref 1.4–6.5)
Neutrophils Relative %: 59 %
PLATELETS: 237 10*3/uL (ref 150–440)
RBC: 4.4 MIL/uL (ref 3.80–5.20)
RDW: 13.9 % (ref 11.5–14.5)
WBC: 6.7 10*3/uL (ref 3.6–11.0)

## 2017-04-01 LAB — URINALYSIS, COMPLETE (UACMP) WITH MICROSCOPIC
Bacteria, UA: NONE SEEN
Bilirubin Urine: NEGATIVE
GLUCOSE, UA: 50 mg/dL — AB
HGB URINE DIPSTICK: NEGATIVE
Ketones, ur: 5 mg/dL — AB
LEUKOCYTES UA: NEGATIVE
NITRITE: NEGATIVE
PH: 5 (ref 5.0–8.0)
Protein, ur: 30 mg/dL — AB
SPECIFIC GRAVITY, URINE: 1.012 (ref 1.005–1.030)

## 2017-04-01 LAB — LACTIC ACID, PLASMA
LACTIC ACID, VENOUS: 4.3 mmol/L — AB (ref 0.5–1.9)
Lactic Acid, Venous: 4.3 mmol/L (ref 0.5–1.9)
Lactic Acid, Venous: 2.5 mmol/L (ref 0.5–1.9)

## 2017-04-01 LAB — VALPROIC ACID LEVEL: Valproic Acid Lvl: 77 ug/mL (ref 50.0–100.0)

## 2017-04-01 LAB — SALICYLATE LEVEL: Salicylate Lvl: <7 mg/dL (ref 2.8–30.0)

## 2017-04-01 LAB — PROTIME-INR
INR: 1.07
PROTHROMBIN TIME: 13.9 s (ref 11.4–15.2)

## 2017-04-01 LAB — ACETAMINOPHEN LEVEL: ACETAMINOPHEN (TYLENOL), SERUM: 17 ug/mL (ref 10–30)

## 2017-04-01 LAB — LIPASE, BLOOD: LIPASE: 19 U/L (ref 11–51)

## 2017-04-01 LAB — TROPONIN I: Troponin I: <0.03 ng/mL (ref <0.03)

## 2017-04-01 MED ORDER — DEXTROSE 50 % IV SOLN
25.0000 g | Freq: Once | INTRAVENOUS | Status: AC
  Administered 2017-04-01: 25 g via INTRAVENOUS

## 2017-04-01 MED ORDER — ONDANSETRON HCL 4 MG/2ML IJ SOLN
4.0000 mg | Freq: Once | INTRAMUSCULAR | Status: AC
  Administered 2017-04-01: 4 mg via INTRAVENOUS

## 2017-04-01 MED ORDER — MELATONIN 5 MG PO TABS
2.5000 mg | ORAL_TABLET | Freq: Every day | ORAL | Status: DC
  Administered 2017-04-01 – 2017-04-02 (×2): 2.5 mg via ORAL
  Filled 2017-04-01 (×3): qty 0.5

## 2017-04-01 MED ORDER — OXYCODONE HCL 5 MG PO TABS
15.0000 mg | ORAL_TABLET | Freq: Three times a day (TID) | ORAL | Status: DC
  Administered 2017-04-01 – 2017-04-03 (×5): 15 mg via ORAL
  Filled 2017-04-01 (×5): qty 3

## 2017-04-01 MED ORDER — DEXTROSE 50 % IV SOLN
INTRAVENOUS | Status: AC
  Filled 2017-04-01: qty 50

## 2017-04-01 MED ORDER — ACETAMINOPHEN 500 MG PO TABS
1000.0000 mg | ORAL_TABLET | Freq: Three times a day (TID) | ORAL | Status: DC
  Administered 2017-04-01 – 2017-04-03 (×5): 1000 mg via ORAL
  Filled 2017-04-01 (×5): qty 2

## 2017-04-01 MED ORDER — FLUTICASONE PROPIONATE 50 MCG/ACT NA SUSP
2.0000 | Freq: Every day | NASAL | Status: DC | PRN
  Administered 2017-04-01 – 2017-04-03 (×3): 2 via NASAL
  Filled 2017-04-01: qty 16

## 2017-04-01 MED ORDER — INSULIN ASPART 100 UNIT/ML ~~LOC~~ SOLN
0.0000 [IU] | Freq: Three times a day (TID) | SUBCUTANEOUS | Status: DC
  Administered 2017-04-01: 1 [IU] via SUBCUTANEOUS
  Filled 2017-04-01: qty 1

## 2017-04-01 MED ORDER — ONDANSETRON HCL 4 MG PO TABS: 4.0000 mg | ORAL_TABLET | Freq: Four times a day (QID) | ORAL | Status: DC | PRN

## 2017-04-01 MED ORDER — TRAZODONE HCL 100 MG PO TABS
100.0000 mg | ORAL_TABLET | Freq: Every day | ORAL | Status: DC
  Administered 2017-04-01 – 2017-04-02 (×2): 100 mg via ORAL
  Filled 2017-04-01 (×2): qty 1

## 2017-04-01 MED ORDER — DIPHENHYDRAMINE HCL 25 MG PO CAPS
25.0000 mg | ORAL_CAPSULE | Freq: Four times a day (QID) | ORAL | Status: DC | PRN
  Administered 2017-04-01: 25 mg via ORAL
  Filled 2017-04-01: qty 1

## 2017-04-01 MED ORDER — FENTANYL 50 MCG/HR TD PT72
75.0000 ug | MEDICATED_PATCH | TRANSDERMAL | Status: DC
  Administered 2017-04-02: 09:00:00 75 ug via TRANSDERMAL
  Filled 2017-04-01: qty 1

## 2017-04-01 MED ORDER — INSULIN ASPART 100 UNIT/ML ~~LOC~~ SOLN: 0.0000 [IU] | Freq: Every day | SUBCUTANEOUS | Status: DC

## 2017-04-01 MED ORDER — CLONAZEPAM 0.5 MG PO TABS
0.5000 mg | ORAL_TABLET | Freq: Two times a day (BID) | ORAL | Status: DC
  Filled 2017-04-01: qty 1

## 2017-04-01 MED ORDER — DULOXETINE HCL 60 MG PO CPEP
120.0000 mg | ORAL_CAPSULE | Freq: Every day | ORAL | Status: DC
  Administered 2017-04-02 – 2017-04-03 (×2): 120 mg via ORAL
  Filled 2017-04-01 (×2): qty 2

## 2017-04-01 MED ORDER — ONDANSETRON HCL 4 MG/2ML IJ SOLN
INTRAMUSCULAR | Status: AC
  Filled 2017-04-01: qty 2

## 2017-04-01 MED ORDER — DEXTROSE-NACL 5-0.9 % IV SOLN: 250.0000 mL | Freq: Once | INTRAVENOUS | Status: DC

## 2017-04-01 MED ORDER — ONDANSETRON HCL 4 MG/2ML IJ SOLN: 4.0000 mg | Freq: Four times a day (QID) | INTRAMUSCULAR | Status: DC | PRN

## 2017-04-01 MED ORDER — APIXABAN 5 MG PO TABS
5.0000 mg | ORAL_TABLET | Freq: Two times a day (BID) | ORAL | Status: DC
  Administered 2017-04-01 – 2017-04-03 (×4): 5 mg via ORAL
  Filled 2017-04-01 (×4): qty 1

## 2017-04-01 MED ORDER — GABAPENTIN 400 MG PO CAPS
1200.0000 mg | ORAL_CAPSULE | Freq: Three times a day (TID) | ORAL | Status: DC
  Administered 2017-04-01 – 2017-04-03 (×5): 1200 mg via ORAL
  Filled 2017-04-01 (×5): qty 3

## 2017-04-01 MED ORDER — DIVALPROEX SODIUM ER 500 MG PO TB24
1500.0000 mg | ORAL_TABLET | Freq: Every day | ORAL | Status: DC
  Administered 2017-04-02 – 2017-04-03 (×2): 1500 mg via ORAL
  Filled 2017-04-01 (×2): qty 3

## 2017-04-01 MED ORDER — DEXTROSE-NACL 5-0.45 % IV SOLN
INTRAVENOUS | Status: DC
  Administered 2017-04-01 – 2017-04-02 (×3): via INTRAVENOUS

## 2017-04-01 MED ORDER — DIPHENHYDRAMINE HCL 25 MG PO CAPS
50.0000 mg | ORAL_CAPSULE | Freq: Four times a day (QID) | ORAL | Status: DC | PRN
  Administered 2017-04-02 – 2017-04-03 (×3): 50 mg via ORAL
  Filled 2017-04-01 (×5): qty 2

## 2017-04-01 NOTE — Progress Notes (Signed)
Patient called operating stating that she has been calling out for 1 hour and no one will answer her call. The RN in was in her room 20 minutes prior to her calling th eoperator and patient had no needs at the time was oriented to call bell and room phone. Room phone was placed on  Patient's table in front of patient. When RN went to check on patient after the operator called, the patient was unable to to tell the RN why she said that she had been calling out for 1 hour.   Harvie HeckMelanie Corde Antonini, RN

## 2017-04-01 NOTE — ED Notes (Signed)
Attempted to call report x 1  

## 2017-04-01 NOTE — H&P (Signed)
[facility] - [place] at [facility]   PATIENT NAME: Amy Sweeney    MR#:  [id number]  DATE OF BIRTH:  Nov 12, 1950  DATE OF ADMISSION:  04/01/2017  PRIMARY CARE PHYSICIAN: Barbette Reichmann, MD   REQUESTING/REFERRING PHYSICIAN: Mayford Knife, MD  CHIEF COMPLAINT:   Chief Complaint  Patient presents with  . Drug Overdose    HISTORY OF PRESENT ILLNESS:  Amy Sweeney  is a 66 y.o. female who presents with Accidental metformin overdose. Patient states that the pills are about the same size as her Depakote, and so Amy Sweeney accidentally took 7000 mg instead of 2000 mg. Here Amy Sweeney was found to have an elevated lactic acid. Her only symptoms have been some nausea and vomiting. Per poison control recommendations hospitals were called for admission for follow-up  PAST MEDICAL HISTORY:   Past Medical History:  Diagnosis Date  . Anxiety   . Arthritis    degenerative artgritis  osteoarthritis  . Bipolar disorder (HCC)   . COPD (chronic obstructive pulmonary disease) (HCC)   . Coronary artery disease   . DDD (degenerative disc disease), cervical   . Diabetes mellitus without complication (HCC)   . Dyspnea   . Family history of adverse reaction to anesthesia    father blood clot after surgery died  . Headache    history of migraines  . History of pulmonary embolus (PE)   . Lower extremity deep venous thrombosis (HCC)   . Lower extremity edema   . 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 Night terrors   . OCD (obsessive compulsive disorder)   . Osteoporosis   . PTSD (post-traumatic stress disorder)     PAST SURGICAL HISTORY:   Past Surgical History:  Procedure Laterality Date  . CARDIAC CATHETERIZATION  2004  . CERVICAL LAMINOPLASTY    . CESAREAN SECTION     x2  . CHOLECYSTECTOMY    . DILATION AND CURETTAGE OF UTERUS    . GASTRIC BYPASS  2004  . IVC FILTER PLACEMENT ([facility] HX)     . KNEE ARTHROSCOPY Left 2004 & 2006  . LAMINECTOMY  2014   lumbar 3-6 sacral 1-3  fusion  . NECK SURGERY     removed the buldge of the disc and hardware.  Marland Kitchen PERIPHERAL VASCULAR CATHETERIZATION N/A 05/21/2015   Procedure: IVC Filter Insertion;  Surgeon: Renford Dills, MD;  Location: [facility] INVASIVE CV LAB;  Service: Cardiovascular;  Laterality: N/A;  . PERIPHERAL VASCULAR CATHETERIZATION N/A 07/23/2015   Procedure: IVC Filter Removal;  Surgeon: Renford Dills, MD;  Location: [facility] INVASIVE CV LAB;  Service: Cardiovascular;  Laterality: N/A;  . PERIPHERAL VASCULAR CATHETERIZATION N/A 07/21/2016   Procedure: IVC Filter Insertion;  Surgeon: Renford Dills, MD;  Location: [facility] INVASIVE CV LAB;  Service: Cardiovascular;  Laterality: N/A;  . PERIPHERAL VASCULAR CATHETERIZATION N/A 10/16/2016   Procedure: IVC Filter Removal;  Surgeon: Renford Dills, MD;  Location: [facility] INVASIVE CV LAB;  Service: Cardiovascular;  Laterality: N/A;  . TOTAL HIP ARTHROPLASTY Left 07/30/2016   Procedure: TOTAL HIP ARTHROPLASTY ANTERIOR APPROACH;  Surgeon: Kennedy Bucker, MD;  Location: [facility] ORS;  Service: Orthopedics;  Laterality: Left;  . TOTAL KNEE ARTHROPLASTY Right 05/30/2015   Procedure: TOTAL KNEE ARTHROPLASTY;  Surgeon: Kennedy Bucker, MD;  Location: [facility] ORS;  Service: Orthopedics;  Laterality: Right;    SOCIAL HISTORY:   Social History  Substance Use Topics  . Smoking status: Never Smoker  .  Smokeless tobacco: Never Used  . Alcohol use No    FAMILY HISTORY:  History reviewed. No pertinent family history.  DRUG ALLERGIES:   Allergies  Allergen Reactions  . Bee Venom Anaphylaxis  . Morphine And Related Anaphylaxis    Blisters itching, N&V, face and throat swelling  . Adhesive [Tape] Other (See Comments)    Itching and water blisters. Paper tape is OK.  . Celebrex [Celecoxib] Swelling  . Erythromycin Rash  . Tegaderm Ag Mesh [Silver] Other (See Comments)    itching and water blisters itching  and water blisters  . Grape Seed Itching and Swelling    Lips and mouth swelling/itching.  Grapes not grape seed  . Levaquin [Levofloxacin In D5w] Diarrhea    MEDICATIONS AT HOME:   Prior to Admission medications   Medication Sig Start Date End Date Taking? Authorizing Provider  acetaminophen (TYLENOL) 500 MG tablet Take 1,500 mg by mouth 3 (three) times daily. With Gabapentin   Yes [provider]  apixaban (ELIQUIS) 5 MG TABS tablet Take 5 mg by mouth every 12 (twelve) hours.    Yes [provider]  baclofen (LIORESAL) 10 MG tablet Take 5 mg by mouth 2 (two) times daily.    Yes [provider]  CALCIUM CITRATE PO Take 2,400 mg by mouth daily at 2 PM.    Yes [provider]  Cholecalciferol (VITAMIN D3) 5000 UNITS CAPS Take 25,000 Units by mouth daily at 2 PM.    Yes [provider]  clonazePAM (KLONOPIN) 0.5 MG tablet Take 0.5 mg by mouth 2 (two) times daily.   Yes [provider]  Cyanocobalamin (VITAMIN B-12) 2500 MCG SUBL Place 5,000 mcg under the tongue daily at 2 PM.    Yes [provider]  denosumab (PROLIA) 60 MG/ML SOLN injection Inject 60 mg into the skin every 6 (six) months. Administer in upper arm, thigh, or abdomen   Yes [provider]  diphenhydrAMINE (SOMINEX) 25 MG tablet Take 50 mg by mouth at bedtime. 50 mg at bedtime, if patient is manic due to BIPOLAR 1 Amy Sweeney will take 100 mg at bedtime.   Yes [provider]  divalproex (DEPAKOTE ER) 500 MG 24 hr tablet Take 1,500 mg by mouth daily.    Yes [provider]  DULoxetine (CYMBALTA) 60 MG capsule Take 120 mg by mouth daily.    Yes [provider]  fentaNYL (DURAGESIC - DOSED MCG/HR) 75 MCG/HR Place 1 patch (75 mcg total) onto the skin every 3 (three) days. 08/01/16  Yes Dedra Skeens, PA-C  ferrous sulfate 325 (65 FE) MG tablet Take 650 mg by mouth daily at 2 PM.    Yes [provider]  fluticasone (FLONASE) 50 MCG/ACT  nasal spray Place 1 spray into both nostrils 2 (two) times daily.    Yes [provider]  gabapentin (NEURONTIN) 400 MG capsule Take 1,200 mg by mouth 3 (three) times daily. With Tylenol 1500 mg 05/24/16  Yes [provider]  Ginkgo Biloba 60 MG CAPS Take 120 mg by mouth daily at 2 PM.    Yes [provider]  Magnesium Oxide -Mg Supplement 250 MG TABS Take 500 mg by mouth daily at 2 PM.    Yes [provider]  Melatonin 1 MG TABS Take 1 tablet (1 mg total) by mouth at bedtime as needed. Patient taking differently: Take 1 mg by mouth at bedtime.  03/11/16  Yes Cuthriell, Delorise Royals, PA-C  metFORMIN (GLUCOPHAGE) 1000 MG  tablet Take 2,000 mg by mouth daily with breakfast.    Yes [provider]  Multiple Vitamins-Minerals (ONE-A-DAY WOMENS 50 PLUS PO) Take 2 tablets by mouth daily at 2 PM.    Yes [provider]  nitroGLYCERIN (NITROSTAT) 0.4 MG SL tablet Place 0.4 mg under the tongue every 5 (five) minutes as needed. For chest pain.   Yes [provider]  oxyCODONE (ROXICODONE) 15 MG immediate release tablet Take 15 mg by mouth 3 (three) times daily. 03/15/17  Yes [provider]  traZODone (DESYREL) 100 MG tablet Take 1 tablet (100 mg total) by mouth at bedtime. 03/11/16  Yes Cuthriell, Delorise Royals, PA-C  vitamin E 1000 UNIT capsule Take 2,000 Units by mouth at bedtime.    Yes [provider]  Zinc 50 MG TABS Take 100 mg by mouth daily at 2 PM.    Yes [provider]  protein supplement shake (PREMIER PROTEIN) LIQD Take 11 oz by mouth daily with breakfast. CARAMEL OR STRAWBERRY PREFERRED    [provider]    REVIEW OF SYSTEMS:  Review of Systems  Constitutional: Negative for chills, fever, malaise/fatigue and weight loss.  HENT: Negative for ear pain, hearing loss and tinnitus.   Eyes: Negative for blurred vision, double vision, pain and redness.  Respiratory: Negative for cough, hemoptysis and shortness  of breath.   Cardiovascular: Negative for chest pain, palpitations, orthopnea and leg swelling.  Gastrointestinal: Positive for nausea and vomiting. Negative for abdominal pain, constipation and diarrhea.  Genitourinary: Negative for dysuria, frequency and hematuria.  Musculoskeletal: Negative for back pain, joint pain and neck pain.  Skin:       No acne, rash, or lesions  Neurological: Negative for dizziness, tremors, focal weakness and weakness.  Endo/Heme/Allergies: Negative for polydipsia. Does not bruise/bleed easily.  Psychiatric/Behavioral: Negative for depression. The patient is not nervous/anxious and does not have insomnia.      VITAL SIGNS:   Vitals:   04/01/17 1400 04/01/17 1430 04/01/17 1500 04/01/17 1530  BP: 130/87 125/81 139/80 (!) 153/90  Pulse: 92 96 98 93  Resp: 17 19 14 12   Temp:      TempSrc:      SpO2: 91% (!) 85% 91% 91%  Weight:      Height:       Wt Readings from Last 3 Encounters:  04/01/17 113.4 kg (250 lb)  10/16/16 114.3 kg (252 lb)  09/28/16 114.3 kg (252 lb)    PHYSICAL EXAMINATION:  Physical Exam  Vitals reviewed. Constitutional: Amy Sweeney is oriented to person, place, and time. Amy Sweeney appears well-developed and well-nourished. No distress.  HENT:  Head: Normocephalic and atraumatic.  Mouth/Throat: Oropharynx is clear and moist.  Eyes: Pupils are equal, round, and reactive to light. Conjunctivae and EOM are normal. No scleral icterus.  Neck: Normal range of motion. Neck supple. No JVD present. No thyromegaly present.  Cardiovascular: Normal rate, regular rhythm and intact distal pulses.  Exam reveals no gallop and no friction rub.   No murmur heard. Respiratory: Effort normal and breath sounds normal. No respiratory distress. Amy Sweeney has no wheezes. Amy Sweeney has no rales.  GI: Soft. Bowel sounds are normal. Amy Sweeney exhibits no distension. There is no tenderness.  Musculoskeletal: Normal range of motion. Amy Sweeney exhibits no edema.  No arthritis, no gout   Lymphadenopathy:    Amy Sweeney has no cervical adenopathy.  Neurological: Amy Sweeney is alert and oriented to person, place, and time. No cranial nerve deficit.  No dysarthria, no aphasia  Skin:  Skin is warm and dry. No rash noted. No erythema.  Psychiatric: Amy Sweeney has a normal mood and affect. Her behavior is normal. Judgment and thought content normal.    LABORATORY PANEL:   CBC  Recent Labs Lab 04/01/17 1015  WBC 6.7  HGB 13.7  HCT 41.2  PLT 237   ------------------------------------------------------------------------------------------------------------------  Chemistries   Recent Labs Lab 04/01/17 1015  NA 131*  K 4.1  CL 94*  CO2 25  GLUCOSE 131*  BUN 11  CREATININE 0.74  CALCIUM 9.2  AST 26  ALT 15  ALKPHOS 63  BILITOT 0.4   ------------------------------------------------------------------------------------------------------------------  Cardiac Enzymes  Recent Labs Lab 04/01/17 1016  TROPONINI <0.03   ------------------------------------------------------------------------------------------------------------------  RADIOLOGY:  No results found.  EKG:   Orders placed or performed during the hospital encounter of 04/01/17  . ED EKG  . ED EKG  . EKG 12-Lead  . EKG 12-Lead    IMPRESSION AND PLAN:  Principal Problem:   Metformin overdose - with subsequent lactic acidosis. We will keep her on IV fluids with dextrose, and monitor her lactic acid until within normal limits. Active Problems:   COPD (chronic obstructive pulmonary disease) (HCC) - continue home meds   Diabetes (HCC) - sliding scale insulin with corresponding glucose checks   Bipolar disorder (HCC) - continue home medications  All the records are reviewed and case discussed with ED provider. Management plans discussed with the patient and/or family.  DVT PROPHYLAXIS: Systemic anticoagulation  GI PROPHYLAXIS: None  ADMISSION STATUS: Inpatient  CODE STATUS: Full Code Status History     Date Active Date Inactive Code Status Order ID Comments User Context   07/30/2016 11:59 AM 08/03/2016  3:23 PM Full Code [id number]  Kennedy BuckerMenz, Michael, MD Inpatient   05/30/2015  2:54 PM 06/02/2015  5:40 PM Full Code [id number]  Kennedy BuckerMenz, Michael, MD Inpatient      TOTAL TIME TAKING CARE OF THIS PATIENT: 45 minutes.   Katalyna Socarras FIELDING 04/01/2017, 3:54 PM  [facility]  Office  [phone]  CC: Primary care physician; Barbette ReichmannHande, Vishwanath, MD  Note:  This document was prepared using Dragon voice recognition software and may include unintentional dictation errors.

## 2017-04-01 NOTE — ED Triage Notes (Signed)
Pt reports at 945 took 7000 mg of metformin instead of her normal 2000 mg. Came EMS. Has had vomiting since.  Blood sugar with EMS 145.  Does wear fentanyl patch at baseline.  Alert and oriented.

## 2017-04-01 NOTE — ED Notes (Signed)
ED Provider at bedside. 

## 2017-04-01 NOTE — ED Notes (Signed)
Blood sugar checked. Pt feels like it is dropping. Dr Mayford Knifewilliams notified.

## 2017-04-01 NOTE — Progress Notes (Signed)
Admission:  Patient alert oriented. Heart and lung sound normal. No skin issues. Patient stated she accidentally took too much of her metformin. Patient has chronic pain and has Fentanyl patch on her left chest from home. Patient oriented to room and call bell system.   Harvie HeckMelanie Posey Jasmin, RN

## 2017-04-01 NOTE — ED Provider Notes (Signed)
[facility] Emergency Department Provider Note       Time seen: ----------------------------------------- 10:34 AM on 04/01/2017 -----------------------------------------     I have reviewed the triage vital signs and the nursing notes.   HISTORY   Chief Complaint Drug Overdose    HPI Amy Sweeney is a 66 y.o. female who presents to the ED for accidental overdose of metformin. Patient reports taking 7 of her 1000 mg metformin around 8:30 this morning. Patient reports she got confused with her Depakote and took this instead of her scheduled 1000 mg of metformin. She has been vomiting, blood sugar on arrival was 145. She does wear a fentanyl patch for chronic pain and arrives alert and oriented. She states this was accidental and not intentional. She denies any recent illness or other complaints.   Past Medical History:  Diagnosis Date  . Anxiety   . Arthritis    degenerative artgritis  osteoarthritis  . Bipolar disorder (HCC)   . COPD (chronic obstructive pulmonary disease) (HCC)   . Coronary artery disease   . DDD (degenerative disc disease), cervical   . Diabetes mellitus without complication (HCC)   . Dyspnea   . Family history of adverse reaction to anesthesia    father blood clot after surgery died  . Headache    history of migraines  . History of pulmonary embolus (PE)   . Lower extremity deep venous thrombosis (HCC)   . Lower extremity edema   . 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 Night terrors   . OCD (obsessive compulsive disorder)   . Osteoporosis   . PTSD (post-traumatic stress disorder)     Patient Active Problem List   Diagnosis Date Noted  . Hypercoagulable state (HCC) 09/28/2016  . DJD (degenerative joint disease) 09/28/2016  . Primary localized osteoarthritis of left hip 07/30/2016  . History of DVT (deep vein thrombosis) 07/16/2016  .  Traumatic arthritis of knee 05/30/2015    Past Surgical History:  Procedure Laterality Date  . CARDIAC CATHETERIZATION  2004  . CERVICAL LAMINOPLASTY    . CESAREAN SECTION     x2  . CHOLECYSTECTOMY    . DILATION AND CURETTAGE OF UTERUS    . GASTRIC BYPASS  2004  . IVC FILTER PLACEMENT ([facility] HX)    . KNEE ARTHROSCOPY Left 2004 & 2006  . LAMINECTOMY  2014   lumbar 3-6 sacral 1-3  fusion  . NECK SURGERY     removed the buldge of the disc and hardware.  Marland Kitchen PERIPHERAL VASCULAR CATHETERIZATION N/A 05/21/2015   Procedure: IVC Filter Insertion;  Surgeon: Renford Dills, MD;  Location: [facility] INVASIVE CV LAB;  Service: Cardiovascular;  Laterality: N/A;  . PERIPHERAL VASCULAR CATHETERIZATION N/A 07/23/2015   Procedure: IVC Filter Removal;  Surgeon: Renford Dills, MD;  Location: [facility] INVASIVE CV LAB;  Service: Cardiovascular;  Laterality: N/A;  . PERIPHERAL VASCULAR CATHETERIZATION N/A 07/21/2016   Procedure: IVC Filter Insertion;  Surgeon: Renford Dills, MD;  Location: [facility] INVASIVE CV LAB;  Service: Cardiovascular;  Laterality: N/A;  . PERIPHERAL VASCULAR CATHETERIZATION N/A 10/16/2016   Procedure: IVC Filter Removal;  Surgeon: Renford Dills, MD;  Location: [facility] INVASIVE CV LAB;  Service: Cardiovascular;  Laterality: N/A;  . TOTAL HIP ARTHROPLASTY Left 07/30/2016   Procedure: TOTAL HIP ARTHROPLASTY ANTERIOR APPROACH;  Surgeon: Kennedy Bucker, MD;  Location: [facility] ORS;  Service: Orthopedics;  Laterality: Left;  . TOTAL KNEE ARTHROPLASTY  Right 05/30/2015   Procedure: TOTAL KNEE ARTHROPLASTY;  Surgeon: Kennedy Bucker, MD;  Location: [facility] ORS;  Service: Orthopedics;  Laterality: Right;    Allergies Bee venom; Morphine and related; Adhesive [tape]; Celebrex [celecoxib]; Erythromycin; Tegaderm ag mesh [silver]; Grape seed; and Levaquin [levofloxacin in d5w]  Social History Social History  Substance Use Topics  . Smoking status: Never Smoker  . Smokeless tobacco: Never Used  . Alcohol use No     Review of Systems Constitutional: Negative for fever. Eyes: Negative for vision changes ENT:  Negative for congestion, sore throat Cardiovascular: Negative for chest pain. Respiratory: Negative for shortness of breath. Gastrointestinal: Negative for abdominal pain,Positive for vomiting Genitourinary: Negative for dysuria. Musculoskeletal: Negative for back pain. Skin: Negative for rash. Neurological: Negative for headaches, focal weakness or numbness.  All systems negative/normal/unremarkable except as stated in the HPI  ____________________________________________   PHYSICAL EXAM:  VITAL SIGNS: ED Triage Vitals  Enc Vitals Group     BP 04/01/17 1017 (!) 213/117     Pulse Rate 04/01/17 1017 (!) 117     Resp 04/01/17 1017 (!) 24     Temp 04/01/17 1017 98.2 F (36.8 C)     Temp Source 04/01/17 1017 Oral     SpO2 04/01/17 1017 90 %     Weight 04/01/17 1013 250 lb (113.4 kg)     Height 04/01/17 1013 5\' 8"  (1.727 m)     Head Circumference --      Peak Flow --      Pain Score --      Pain Loc --      Pain Edu? --      Excl. in GC? --    Constitutional: Alert and oriented.Mild distress Eyes: Conjunctivae are normal. Normal extraocular movements. ENT   Head: Normocephalic and atraumatic.   Nose: No congestion/rhinnorhea.   Mouth/Throat: Mucous membranes are moist.   Neck: No stridor. Cardiovascular: Normal rate, regular rhythm. No murmurs, rubs, or gallops. Respiratory: Normal respiratory effort without tachypnea nor retractions. Breath sounds are clear and equal bilaterally. No wheezes/rales/rhonchi. Gastrointestinal: Soft and nontender. Normal bowel sounds Musculoskeletal: Nontender with normal range of motion in extremities. No lower extremity tenderness nor edema. Neurologic:  Normal speech and language. No gross focal neurologic deficits are appreciated.  Skin:  Skin is warm, dry and intact. No rash noted. Psychiatric: Mood and affect are normal.  Speech and behavior are normal.  ____________________________________________  EKG: Interpreted by me.Sinus tachycardia rate of 101 bpm, borderline prolonged PR interval, normal QRS, normal QT.  ____________________________________________  ED COURSE:  Pertinent labs & imaging results that were available during my care of the patient were reviewed by me and considered in my medical decision making (see chart for details). Patient presents for an accidental overdose, we will assess with labs and imaging as indicated. Clinical Course as of Apr 01 1457  Thu Apr 01, 2017  1043 Poison control recommends 6 hours observation, serial lactic acid, supportive care  [JW]  1147 Lactic Acid, Venous: (!!) 4.3 [JW]    Clinical Course User Index [JW] Emily Filbert, MD   Procedures ____________________________________________   LABS (pertinent positives/negatives)  Labs Reviewed  COMPREHENSIVE METABOLIC PANEL - Abnormal; Notable for the following:       Result Value   Sodium 131 (*)    Chloride 94 (*)    Glucose, Bld 131 (*)    All other components within normal limits  GLUCOSE, CAPILLARY - Abnormal; Notable for the following:  Glucose-Capillary 129 (*)    All other components within normal limits  LACTIC ACID, PLASMA - Abnormal; Notable for the following:    Lactic Acid, Venous 4.3 (*)    All other components within normal limits  LACTIC ACID, PLASMA - Abnormal; Notable for the following:    Lactic Acid, Venous 4.3 (*)    All other components within normal limits  CBC WITH DIFFERENTIAL/PLATELET  TROPONIN I  VALPROIC ACID LEVEL  LIPASE, BLOOD  GLUCOSE, CAPILLARY  URINALYSIS, COMPLETE (UACMP) WITH MICROSCOPIC  PROTIME-INR  ACETAMINOPHEN LEVEL  SALICYLATE LEVEL  CBG MONITORING, ED  CRITICAL CARE Performed by: Emily FilbertWilliams, Jonathan E   Total critical care time: 30 minutes  Critical care time was exclusive of separately billable procedures and treating other  patients.  Critical care was necessary to treat or prevent imminent or life-threatening deterioration.  Critical care was time spent personally by me on the following activities: development of treatment plan with patient and/or surrogate as well as nursing, discussions with consultants, evaluation of patient's response to treatment, examination of patient, obtaining history from patient or surrogate, ordering and performing treatments and interventions, ordering and review of laboratory studies, ordering and review of radiographic studies, pulse oximetry and re-evaluation of patient's condition.  ____________________________________________  FINAL ASSESSMENT AND PLAN  Accidental overdose, lactic acidosis  Plan: Patient's labs and imaging were dictated above. Patient had presented for An accidental overdose of metformin. This will likely result in a 6 hour observation at a minimum. This has been known to cause lactic acidosis but not significant hypoglycemia.  I have discussed with Poison control who recommends admission for 24 hours of monitoring, repeat chemistries, serial lactic acids and monitoring.  Emily FilbertWilliams, Jonathan E, MD   Note: This note was generated in part or whole with voice recognition software. Voice recognition is usually quite accurate but there are transcription errors that can and very often do occur. I apologize for any typographical errors that were not detected and corrected.     Emily FilbertWilliams, Jonathan E, MD 04/01/17 [phone]

## 2017-04-02 LAB — CBC
HCT: 35.4 % (ref 35.0–47.0)
Hemoglobin: 12 g/dL (ref 12.0–16.0)
MCH: 31.7 pg (ref 26.0–34.0)
MCHC: 33.9 g/dL (ref 32.0–36.0)
MCV: 93.4 fL (ref 80.0–100.0)
PLATELETS: 197 10*3/uL (ref 150–440)
RBC: 3.79 MIL/uL — ABNORMAL LOW (ref 3.80–5.20)
RDW: 13.2 % (ref 11.5–14.5)
WBC: 5.4 10*3/uL (ref 3.6–11.0)

## 2017-04-02 LAB — POTASSIUM: Potassium: 4.6 mmol/L (ref 3.5–5.1)

## 2017-04-02 LAB — LACTIC ACID, PLASMA
LACTIC ACID, VENOUS: 2.4 mmol/L — AB (ref 0.5–1.9)
LACTIC ACID, VENOUS: 1.8 mmol/L (ref 0.5–1.9)
LACTIC ACID, VENOUS: 2.9 mmol/L — AB (ref 0.5–1.9)
LACTIC ACID, VENOUS: 1.4 mmol/L (ref 0.5–1.9)
Lactic Acid, Venous: 2 mmol/L (ref 0.5–1.9)
Lactic Acid, Venous: 1.7 mmol/L (ref 0.5–1.9)

## 2017-04-02 LAB — BASIC METABOLIC PANEL
Anion gap: 6 (ref 5–15)
BUN: 8 mg/dL (ref 6–20)
CALCIUM: 8.9 mg/dL (ref 8.9–10.3)
CO2: 33 mmol/L — ABNORMAL HIGH (ref 22–32)
CREATININE: 0.71 mg/dL (ref 0.44–1.00)
Chloride: 97 mmol/L — ABNORMAL LOW (ref 101–111)
GFR calc Af Amer: >60 mL/min (ref >60)
GLUCOSE: 120 mg/dL — AB (ref 65–99)
Potassium: 5.5 mmol/L — ABNORMAL HIGH (ref 3.5–5.1)
SODIUM: 136 mmol/L (ref 135–145)

## 2017-04-02 LAB — HIV ANTIBODY (ROUTINE TESTING W REFLEX): HIV SCREEN 4TH GENERATION: NONREACTIVE

## 2017-04-02 LAB — GLUCOSE, CAPILLARY
GLUCOSE-CAPILLARY: 106 mg/dL — AB (ref 65–99)
GLUCOSE-CAPILLARY: 112 mg/dL — AB (ref 65–99)
Glucose-Capillary: 113 mg/dL — ABNORMAL HIGH (ref 65–99)
Glucose-Capillary: 100 mg/dL — ABNORMAL HIGH (ref 65–99)

## 2017-04-02 MED ORDER — SODIUM CHLORIDE 0.9 % IV SOLN
INTRAVENOUS | Status: DC
  Administered 2017-04-02 – 2017-04-03 (×2): via INTRAVENOUS

## 2017-04-02 MED ORDER — SODIUM POLYSTYRENE SULFONATE 15 GM/60ML PO SUSP
30.0000 g | Freq: Once | ORAL | Status: AC
  Administered 2017-04-02: 30 g via ORAL
  Filled 2017-04-02: qty 120

## 2017-04-02 NOTE — Progress Notes (Signed)
[facility] [place] at [facility]   PATIENT NAME: Amy MaladyRebecca Sweeney    MR#:  [id number]  DATE OF BIRTH:  1950/09/25  SUBJECTIVE:  CHIEF COMPLAINT:   Chief Complaint  Patient presents with  . Drug Overdose   -Has chronic pain. The rates her pain high, appears very comfortable and very conversational. -Lactic acid remains elevated. Came in with an unintentional metformin overdose. -Sugars starting to drop without D5 fluids today  REVIEW OF SYSTEMS:  Review of Systems  Constitutional: Negative for chills, fever and malaise/fatigue.  HENT: Negative for ear discharge and hearing loss.   Eyes: Negative for blurred vision and double vision.  Respiratory: Negative for cough, shortness of breath and wheezing.   Cardiovascular: Negative for chest pain and palpitations.  Gastrointestinal: Negative for abdominal pain, constipation, diarrhea, nausea and vomiting.  Genitourinary: Negative for dysuria.  Musculoskeletal: Positive for back pain, joint pain and myalgias.  Neurological: Negative for dizziness, speech change, focal weakness, seizures and headaches.  Psychiatric/Behavioral: Negative for depression.    DRUG ALLERGIES:   Allergies  Allergen Reactions  . Bee Venom Anaphylaxis  . Morphine And Related Anaphylaxis    Blisters itching, N&V, face and throat swelling  . Adhesive [Tape] Other (See Comments)    Itching and water blisters. Paper tape is OK.  . Celebrex [Celecoxib] Swelling  . Erythromycin Rash  . Tegaderm Ag Mesh [Silver] Other (See Comments)    itching and water blisters itching and water blisters  . Grape Seed Itching and Swelling    Lips and mouth swelling/itching.  Grapes not grape seed  . Levaquin [Levofloxacin In D5w] Diarrhea    VITALS:  Blood pressure (!) 119/57, pulse 65, temperature 97.8 F (36.6 C), temperature source Oral, resp. rate 20, height 5\' 8"  (1.727 m), weight 113.4 kg (250 lb), SpO2 97 %.  PHYSICAL EXAMINATION:    Physical Exam  GENERAL:  66 y.o.-year-old Obese patient lying in the bed with no acute distress.  EYES: Pupils equal, round, reactive to light and accommodation. No scleral icterus. Extraocular muscles intact.  HEENT: Head atraumatic, normocephalic. Oropharynx and nasopharynx clear.  NECK:  Supple, no jugular venous distention. No thyroid enlargement, no tenderness.  LUNGS: Normal breath sounds bilaterally, no wheezing, rales,rhonchi or crepitation. No use of accessory muscles of respiration.  CARDIOVASCULAR: S1, S2 normal. No murmurs, rubs, or gallops.  ABDOMEN: Soft, nontender, nondistended. Bowel sounds present. No organomegaly or mass.  EXTREMITIES: No pedal edema, cyanosis, or clubbing.  NEUROLOGIC: Cranial nerves II through XII are intact. Muscle strength 5/5 in all extremities. Sensation intact. Gait not checked.  PSYCHIATRIC: The patient is alert and oriented x 3.  SKIN: No obvious rash, lesion, or ulcer.    LABORATORY PANEL:   CBC  Recent Labs Lab 04/02/17 0509  WBC 5.4  HGB 12.0  HCT 35.4  PLT 197   ------------------------------------------------------------------------------------------------------------------  Chemistries   Recent Labs Lab 04/01/17 1015 04/02/17 0509 04/02/17 1048  NA 131* 136  --   K 4.1 5.5* 4.6  CL 94* 97*  --   CO2 25 33*  --   GLUCOSE 131* 120*  --   BUN 11 8  --   CREATININE 0.74 0.71  --   CALCIUM 9.2 8.9  --   AST 26  --   --   ALT 15  --   --   ALKPHOS 63  --   --   BILITOT 0.4  --   --    ------------------------------------------------------------------------------------------------------------------  Cardiac  Enzymes  Recent Labs Lab 04/01/17 1016  TROPONINI <0.03   ------------------------------------------------------------------------------------------------------------------  RADIOLOGY:  No results found.  EKG:   Orders placed or performed during the hospital encounter of 04/01/17  . ED EKG  . ED EKG  .  EKG 12-Lead  . EKG 12-Lead    ASSESSMENT AND PLAN:   66 year old female with past medical history significant for chronic pain syndrome, COPD not on home oxygen, bipolar, arthritis, history of DVT and PE diabetes presents to hospital secondary to accidental overdose of metformin.  #1 unintentional metformin overdose-patient got confused with her medications and took about 7000 mg of metformin yesterday. -GI symptoms have resolved today. -Discontinue D5 fluids and monitor her sugars. Lactic acid remains elevated but slowly improving. -Follow up lactic acid again in a.m., if sugars are stable-can be discharged.  #2 chronic pain syndrome-follows with pain management clinic and is on a pain contract. -Continue fentanyl, gabapentin, oxycodone and Tylenol  #3 hyperkalemia-likely triggered from her acidosis. Received 1 dose of Kayexalate and potassium normalized.  #4 diabetes mellitus-for now hold metformin. Continue to check sugars and is on sliding scale insulin.  #5 DVT prophylaxis-patient already on eliquis from her history of DVT and PE.   All the records are reviewed and case discussed with Care Management/Social Workerr. Management plans discussed with the patient, family and they are in agreement.  CODE STATUS:  Full code  TOTAL TIME TAKING CARE OF THIS PATIENT: 38 minutes.   POSSIBLE D/C tomorrow, DEPENDING ON CLINICAL CONDITION.   Ginni Eichler M.D on 04/02/2017 at 2:54 PM  Between 7am to 6pm - Pager - [phone]  After 6pm go to [email or web address] - Social research officer, government  [facility] [place] [facility]  Office  [phone]  CC: Primary care physician; Barbette Reichmann, MD

## 2017-04-02 NOTE — Care Management Important Message (Signed)
Important Message  Patient Details  Name: Amy Sweeney MRN: [id number] Date of Birth: 1951-04-25   Medicare Important Message Given:  Yes    Marily MemosLisa M Catherine Oak, RN 04/02/2017, 9:06 AM

## 2017-04-03 LAB — BASIC METABOLIC PANEL
Anion gap: 5 (ref 5–15)
BUN: 7 mg/dL (ref 6–20)
CO2: 35 mmol/L — AB (ref 22–32)
Calcium: 8.8 mg/dL — ABNORMAL LOW (ref 8.9–10.3)
Chloride: 96 mmol/L — ABNORMAL LOW (ref 101–111)
Creatinine, Ser: 0.81 mg/dL (ref 0.44–1.00)
GFR calc Af Amer: >60 mL/min (ref >60)
GLUCOSE: 127 mg/dL — AB (ref 65–99)
POTASSIUM: 4.5 mmol/L (ref 3.5–5.1)
Sodium: 136 mmol/L (ref 135–145)

## 2017-04-03 LAB — LACTIC ACID, PLASMA
LACTIC ACID, VENOUS: 1.8 mmol/L (ref 0.5–1.9)
Lactic Acid, Venous: 1.5 mmol/L (ref 0.5–1.9)
Lactic Acid, Venous: 1.3 mmol/L (ref 0.5–1.9)

## 2017-04-03 LAB — GLUCOSE, CAPILLARY: GLUCOSE-CAPILLARY: 100 mg/dL — AB (ref 65–99)

## 2017-04-03 NOTE — Progress Notes (Signed)
Patient asking for baclofen, medication she normally takes at home twice daily. MD notified.

## 2017-04-03 NOTE — Discharge Summary (Signed)
[facility] [place] [facility]   PATIENT NAME: Amy MaladyRebecca Sweeney    MR#:  [id number]  DATE OF BIRTH:  1951/04/09  DATE OF ADMISSION:  04/01/2017 ADMITTING PHYSICIAN: Oralia Manisavid Willis, MD  DATE OF DISCHARGE: 04/03/2017   PRIMARY CARE PHYSICIAN: Barbette ReichmannHande, Vishwanath, MD    ADMISSION DIAGNOSIS:  Lactic acidosis [E87.2] Accidental drug overdose, initial encounter [T50.901A]  DISCHARGE DIAGNOSIS:  Principal Problem:   Metformin overdose Active Problems:   COPD (chronic obstructive pulmonary disease) (HCC)   Diabetes (HCC)   Bipolar disorder (HCC)   SECONDARY DIAGNOSIS:   Past Medical History:  Diagnosis Date  . Anxiety   . Arthritis    degenerative artgritis  osteoarthritis  . Bipolar disorder (HCC)   . COPD (chronic obstructive pulmonary disease) (HCC)   . Coronary artery disease   . DDD (degenerative disc disease), cervical   . Diabetes mellitus without complication (HCC)   . Dyspnea   . Family history of adverse reaction to anesthesia    father blood clot after surgery died  . Headache    history of migraines  . History of pulmonary embolus (PE)   . Lower extremity deep venous thrombosis (HCC)   . Lower extremity edema   . 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 Night terrors   . OCD (obsessive compulsive disorder)   . Osteoporosis   . PTSD (post-traumatic stress disorder)     HOSPITAL COURSE:   66 year old female with past medical history significant for chronic pain syndrome, COPD not on home oxygen, bipolar, arthritis, history of DVT and PE diabetes presents to hospital secondary to accidental overdose of metformin.  #1 unintentional metformin overdose-patient got confused with her medications and took about 7000 mg of metformin . -GI symptoms have resolved today. -Discontinue D5 fluids and monitor her sugars. Lactic acid was elevated but slowly  improving. -blood sugar and lactic acid are normal.  #2 chronic pain syndrome-follows with pain management clinic and is on a pain contract. -Continue fentanyl, gabapentin, oxycodone and Tylenol  #3 hyperkalemia-likely triggered from her acidosis. Received 1 dose of Kayexalate and potassium normalized.  #4 diabetes mellitus-for now hold metformin. Continue to check sugars and is on sliding scale insulin.   Resume metformin on d/c.  #5 DVT prophylaxis-patient already on eliquis from her history of DVT and PE.   DISCHARGE CONDITIONS:   stable  CONSULTS OBTAINED:    DRUG ALLERGIES:   Allergies  Allergen Reactions  . Bee Venom Anaphylaxis  . Morphine And Related Anaphylaxis    Blisters itching, N&V, face and throat swelling  . Adhesive [Tape] Other (See Comments)    Itching and water blisters. Paper tape is OK.  . Celebrex [Celecoxib] Swelling  . Erythromycin Rash  . Tegaderm Ag Mesh [Silver] Other (See Comments)    itching and water blisters itching and water blisters  . Grape Seed Itching and Swelling    Lips and mouth swelling/itching.  Grapes not grape seed  . Levaquin [Levofloxacin In D5w] Diarrhea    DISCHARGE MEDICATIONS:   Current Discharge Medication List    CONTINUE these medications which have NOT CHANGED   Details  acetaminophen (TYLENOL) 500 MG tablet Take 1,500 mg by mouth 3 (three) times daily. With Gabapentin    apixaban (ELIQUIS) 5 MG TABS tablet Take 5 mg by mouth every 12 (twelve) hours.     baclofen (LIORESAL) 10 MG tablet Take 5 mg by mouth 2 (two)  times daily.     CALCIUM CITRATE PO Take 2,400 mg by mouth daily at 2 PM.     Cholecalciferol (VITAMIN D3) 5000 UNITS CAPS Take 25,000 Units by mouth daily at 2 PM.     clonazePAM (KLONOPIN) 0.5 MG tablet Take 0.5 mg by mouth 2 (two) times daily.    Cyanocobalamin (VITAMIN B-12) 2500 MCG SUBL Place 5,000 mcg under the tongue daily at 2 PM.     denosumab (PROLIA) 60 MG/ML SOLN injection  Inject 60 mg into the skin every 6 (six) months. Administer in upper arm, thigh, or abdomen    diphenhydrAMINE (SOMINEX) 25 MG tablet Take 50 mg by mouth at bedtime. 50 mg at bedtime, if patient is manic due to BIPOLAR 1 she will take 100 mg at bedtime.    divalproex (DEPAKOTE ER) 500 MG 24 hr tablet Take 1,500 mg by mouth daily.     DULoxetine (CYMBALTA) 60 MG capsule Take 120 mg by mouth daily.     fentaNYL (DURAGESIC - DOSED MCG/HR) 75 MCG/HR Place 1 patch (75 mcg total) onto the skin every 3 (three) days. Qty: 5 patch, Refills: 0    ferrous sulfate 325 (65 FE) MG tablet Take 650 mg by mouth daily at 2 PM.     fluticasone (FLONASE) 50 MCG/ACT nasal spray Place 1 spray into both nostrils 2 (two) times daily.     gabapentin (NEURONTIN) 400 MG capsule Take 1,200 mg by mouth 3 (three) times daily. With Tylenol 1500 mg    Ginkgo Biloba 60 MG CAPS Take 120 mg by mouth daily at 2 PM.     Magnesium Oxide -Mg Supplement 250 MG TABS Take 500 mg by mouth daily at 2 PM.     Melatonin 1 MG TABS Take 1 tablet (1 mg total) by mouth at bedtime as needed. Qty: 5 tablet, Refills: 0    metFORMIN (GLUCOPHAGE) 1000 MG tablet Take 2,000 mg by mouth daily with breakfast.     Multiple Vitamins-Minerals (ONE-A-DAY WOMENS 50 PLUS PO) Take 2 tablets by mouth daily at 2 PM.     nitroGLYCERIN (NITROSTAT) 0.4 MG SL tablet Place 0.4 mg under the tongue every 5 (five) minutes as needed. For chest pain.    oxyCODONE (ROXICODONE) 15 MG immediate release tablet Take 15 mg by mouth 3 (three) times daily. Refills: 0    traZODone (DESYREL) 100 MG tablet Take 1 tablet (100 mg total) by mouth at bedtime. Qty: 5 tablet, Refills: 0    vitamin E 1000 UNIT capsule Take 2,000 Units by mouth at bedtime.     Zinc 50 MG TABS Take 100 mg by mouth daily at 2 PM.     protein supplement shake (PREMIER PROTEIN) LIQD Take 11 oz by mouth daily with breakfast. CARAMEL OR STRAWBERRY PREFERRED         DISCHARGE INSTRUCTIONS:     Follow with PMD in 1-2 weeks.  If you experience worsening of your admission symptoms, develop shortness of breath, life threatening emergency, suicidal or homicidal thoughts you must seek medical attention immediately by calling 911 or calling your MD immediately  if symptoms less severe.  You Must read complete instructions/literature along with all the possible adverse reactions/side effects for all the Medicines you take and that have been prescribed to you. Take any new Medicines after you have completely understood and accept all the possible adverse reactions/side effects.   Please note  You were cared for by a hospitalist during your hospital stay. If you  have any questions about your discharge medications or the care you received while you were in the hospital after you are discharged, you can call the unit and asked to speak with the hospitalist on call if the hospitalist that took care of you is not available. Once you are discharged, your primary care physician will handle any further medical issues. Please note that NO REFILLS for any discharge medications will be authorized once you are discharged, as it is imperative that you return to your primary care physician (or establish a relationship with a primary care physician if you do not have one) for your aftercare needs so that they can reassess your need for medications and monitor your lab values.    Today   CHIEF COMPLAINT:   Chief Complaint  Patient presents with  . Drug Overdose    HISTORY OF PRESENT ILLNESS:  Amy Sweeney  is a 66 y.o. female presents with Accidental metformin overdose. Patient states that the pills are about the same size as her Depakote, and so she accidentally took 7000 mg instead of 2000 mg. Here she was found to have an elevated lactic acid. Her only symptoms have been some nausea and vomiting. Per poison control recommendations hospitals were called for admission for follow-up  VITAL SIGNS:   Blood pressure (!) 150/83, pulse 68, temperature 97.8 F (36.6 C), temperature source Oral, resp. rate 16, height 5\' 8"  (1.727 m), weight 113.4 kg (250 lb), SpO2 92 %.  I/O:   Intake/Output Summary (Last 24 hours) at 04/03/17 1003 Last data filed at 04/03/17 0400  Gross per 24 hour  Intake          1198.75 ml  Output                0 ml  Net          1198.75 ml    PHYSICAL EXAMINATION:   GENERAL:  66 y.o.-year-old Obese patient lying in the bed with no acute distress.  EYES: Pupils equal, round, reactive to light and accommodation. No scleral icterus. Extraocular muscles intact.  HEENT: Head atraumatic, normocephalic. Oropharynx and nasopharynx clear.  NECK:  Supple, no jugular venous distention. No thyroid enlargement, no tenderness.  LUNGS: Normal breath sounds bilaterally, no wheezing, rales,rhonchi or crepitation. No use of accessory muscles of respiration.  CARDIOVASCULAR: S1, S2 normal. No murmurs, rubs, or gallops.  ABDOMEN: Soft, nontender, nondistended. Bowel sounds present. No organomegaly or mass.  EXTREMITIES: No pedal edema, cyanosis, or clubbing.  NEUROLOGIC: Cranial nerves II through XII are intact. Muscle strength 5/5 in all extremities. Sensation intact. Gait not checked.  PSYCHIATRIC: The patient is alert and oriented x 3.  SKIN: No obvious rash, lesion, or ulcer.   DATA REVIEW:   CBC  Recent Labs Lab 04/02/17 0509  WBC 5.4  HGB 12.0  HCT 35.4  PLT 197    Chemistries   Recent Labs Lab 04/01/17 1015  04/03/17 0413  NA 131*  < > 136  K 4.1  < > 4.5  CL 94*  < > 96*  CO2 25  < > 35*  GLUCOSE 131*  < > 127*  BUN 11  < > 7  CREATININE 0.74  < > 0.81  CALCIUM 9.2  < > 8.8*  AST 26  --   --   ALT 15  --   --   ALKPHOS 63  --   --   BILITOT 0.4  --   --   < > =  values in this interval not displayed.  Cardiac Enzymes  Recent Labs Lab 04/01/17 1016  TROPONINI <0.03    Microbiology Results  Results for orders placed or performed during the  hospital encounter of 07/23/16  Urine culture     Status: None   Collection Time: 07/23/16 11:33 AM  Result Value Ref Range Status   Specimen Description URINE, CLEAN CATCH  Final   Special Requests NONE  Final   Culture NO GROWTH Performed at [facility]   Final   Report Status 07/24/2016 FINAL  Final  Surgical pcr screen     Status: None   Collection Time: 07/23/16 11:33 AM  Result Value Ref Range Status   MRSA, PCR NEGATIVE NEGATIVE Final   Staphylococcus aureus NEGATIVE NEGATIVE Final    Comment:        The Xpert SA Assay (FDA approved for NASAL specimens in patients over 76 years of age), is one component of a comprehensive surveillance program.  Test performance has been validated by [facility] for patients greater than or equal to 102 year old. It is not intended to diagnose infection nor to guide or monitor treatment.     RADIOLOGY:  No results found.  EKG:   Orders placed or performed during the hospital encounter of 04/01/17  . ED EKG  . ED EKG  . EKG 12-Lead  . EKG 12-Lead      Management plans discussed with the patient, family and they are in agreement.  CODE STATUS:     Code Status Orders        Start     Ordered   04/01/17 1716  Full code  Continuous     04/01/17 1715    Code Status History    Date Active Date Inactive Code Status Order ID Comments User Context   07/30/2016 11:59 AM 08/03/2016  3:23 PM Full Code [id number]  Kennedy Bucker, MD Inpatient   05/30/2015  2:54 PM 06/02/2015  5:40 PM Full Code [id number]  Kennedy Bucker, MD Inpatient    Advance Directive Documentation     Most Recent Value  Type of Advance Directive  Healthcare Power of Attorney, Living will  Pre-existing out of facility DNR order (yellow form or pink MOST form)  -  "MOST" Form in Place?  -      TOTAL TIME TAKING CARE OF THIS PATIENT: 35 minutes.    Altamese Dilling M.D on 04/03/2017 at 10:03 AM  Between 7am to 6pm - Pager -  [phone]  After 6pm go to [email or web address] - password [email or web address]  [facility] [place] [facility]  Office  [phone]  CC: Primary care physician; Barbette Reichmann, MD   Note: This dictation was prepared with Dragon dictation along with smaller phrase technology. Any transcriptional errors that result from this process are unintentional.

## 2017-04-03 NOTE — Progress Notes (Signed)
Patient c/o 10/10 pain, states she cannot wait until 1000 when medications are due. No PRN medications for pain. MD notified, stated to give 1000 scheduled pain medications now.

## 2017-04-03 NOTE — Progress Notes (Signed)
Reviewed Discharge summary with verbal understanding. Answered all questions. Belongings given upon discharge.

## 2017-04-03 NOTE — Progress Notes (Signed)
Patient eating [place] sandwich tray, states that her throat feels like it is getting tight. Benadryl given. Continue to monitor.

## 2017-04-09 LAB — HEMOGLOBIN A1C
Hgb A1c MFr Bld: 6 % — ABNORMAL HIGH (ref 4.8–5.6)
Mean Plasma Glucose: 126 mg/dL

## 2017-05-20 ENCOUNTER — Encounter: Payer: Self-pay | Admitting: Emergency Medicine

## 2017-05-20 ENCOUNTER — Emergency Department: Payer: Medicare Other

## 2017-05-20 ENCOUNTER — Emergency Department
Admission: EM | Admit: 2017-05-20 | Discharge: 2017-05-21 | Disposition: A | Discharge status: Home / Self Care | Payer: Medicare Other | Attending: Emergency Medicine | Admitting: Emergency Medicine

## 2017-05-20 DIAGNOSIS — E119 Type 2 diabetes mellitus without complications: Secondary | ICD-10-CM | POA: Diagnosis not present

## 2017-05-20 DIAGNOSIS — W51XXXA Accidental striking against or bumped into by another person, initial encounter: Secondary | ICD-10-CM | POA: Insufficient documentation

## 2017-05-20 DIAGNOSIS — Z7984 Long term (current) use of oral hypoglycemic drugs: Secondary | ICD-10-CM | POA: Insufficient documentation

## 2017-05-20 DIAGNOSIS — J449 Chronic obstructive pulmonary disease, unspecified: Secondary | ICD-10-CM | POA: Insufficient documentation

## 2017-05-20 DIAGNOSIS — Z86718 Personal history of other venous thrombosis and embolism: Secondary | ICD-10-CM | POA: Insufficient documentation

## 2017-05-20 DIAGNOSIS — Y92003 Bedroom of unspecified non-institutional (private) residence as the place of occurrence of the external cause: Secondary | ICD-10-CM | POA: Insufficient documentation

## 2017-05-20 DIAGNOSIS — S060X0A Concussion without loss of consciousness, initial encounter: Secondary | ICD-10-CM | POA: Diagnosis not present

## 2017-05-20 DIAGNOSIS — Y999 Unspecified external cause status: Secondary | ICD-10-CM | POA: Insufficient documentation

## 2017-05-20 DIAGNOSIS — Z79899 Other long term (current) drug therapy: Secondary | ICD-10-CM | POA: Insufficient documentation

## 2017-05-20 DIAGNOSIS — Y9389 Activity, other specified: Secondary | ICD-10-CM | POA: Diagnosis not present

## 2017-05-20 DIAGNOSIS — Z7901 Long term (current) use of anticoagulants: Secondary | ICD-10-CM | POA: Diagnosis not present

## 2017-05-20 DIAGNOSIS — S0990XA Unspecified injury of head, initial encounter: Secondary | ICD-10-CM | POA: Diagnosis present

## 2017-05-20 HISTORY — DX: Unspecified osteoarthritis, unspecified site: M19.90
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
## 2017-05-20 NOTE — Discharge Instructions (Addendum)
It is normal for the symptoms of a concussion to last up to a full month.  Please make sure you get good rest and follow-up with your primary care physician if your symptoms last longer than 2 weeks. Return to the emergency department for any concerns.  It was a pleasure to take care of you today, and thank you for coming to our emergency department.  If you have any questions or concerns before leaving please ask the nurse to grab me and I'm more than happy to go through your aftercare instructions again.  If you were prescribed any opioid pain medication today such as Norco, Vicodin, Percocet, morphine, hydrocodone, or oxycodone please make sure you do not drive when you are taking this medication as it can alter your ability to drive safely.  If you have any concerns once you are home that you are not improving or are in fact getting worse before you can make it to your follow-up appointment, please do not hesitate to call 911 and come back for further evaluation.  Merrily Brittle, MD  Results for orders placed or performed during the hospital encounter of 04/01/17  CBC with Differential  Result Value Ref Range   WBC 6.7 3.6 - 11.0 K/uL   RBC 4.40 3.80 - 5.20 MIL/uL   Hemoglobin 13.7 12.0 - 16.0 g/dL   HCT 16.1 09.6 - 04.5 %   MCV 93.4 80.0 - 100.0 fL   MCH 31.2 26.0 - 34.0 pg   MCHC 33.4 32.0 - 36.0 g/dL   RDW 40.9 81.1 - 91.4 %   Platelets 237 150 - 440 K/uL   Neutrophils Relative % 59 %   Neutro Abs 4.0 1.4 - 6.5 K/uL   Lymphocytes Relative 34 %   Lymphs Abs 2.3 1.0 - 3.6 K/uL   Monocytes Relative 6 %   Monocytes Absolute 0.4 0.2 - 0.9 K/uL   Eosinophils Relative 0 %   Eosinophils Absolute 0.0 0 - 0.7 K/uL   Basophils Relative 1 %   Basophils Absolute 0.0 0 - 0.1 K/uL  Comprehensive metabolic panel  Result Value Ref Range   Sodium 131 (L) 135 - 145 mmol/L   Potassium 4.1 3.5 - 5.1 mmol/L   Chloride 94 (L) 101 - 111 mmol/L   CO2 25 22 - 32 mmol/L   Glucose, Bld 131 (H) 65 -  99 mg/dL   BUN 11 6 - 20 mg/dL   Creatinine, Ser 7.82 0.44 - 1.00 mg/dL   Calcium 9.2 8.9 - 95.6 mg/dL   Total Protein 7.3 6.5 - 8.1 g/dL   Albumin 3.8 3.5 - 5.0 g/dL   AST 26 15 - 41 U/L   ALT 15 14 - 54 U/L   Alkaline Phosphatase 63 38 - 126 U/L   Total Bilirubin 0.4 0.3 - 1.2 mg/dL   GFR calc non Af Amer >60 >60 mL/min   GFR calc Af Amer >60 >60 mL/min   Anion gap 12 5 - 15  Urinalysis, Complete w Microscopic  Result Value Ref Range   Color, Urine YELLOW (A) YELLOW   APPearance CLEAR (A) CLEAR   Specific Gravity, Urine 1.012 1.005 - 1.030   pH 5.0 5.0 - 8.0   Glucose, UA 50 (A) NEGATIVE mg/dL   Hgb urine dipstick NEGATIVE NEGATIVE   Bilirubin Urine NEGATIVE NEGATIVE   Ketones, ur 5 (A) NEGATIVE mg/dL   Protein, ur 30 (A) NEGATIVE mg/dL   Nitrite NEGATIVE NEGATIVE   Leukocytes, UA NEGATIVE NEGATIVE  RBC / HPF 0-5 0 - 5 RBC/hpf   WBC, UA 0-5 0 - 5 WBC/hpf   Bacteria, UA NONE SEEN NONE SEEN   Squamous Epithelial / LPF 0-5 (A) NONE SEEN   Mucus PRESENT   Troponin I  Result Value Ref Range   Troponin I <0.03 <0.03 ng/mL  Valproic acid level  Result Value Ref Range   Valproic Acid Lvl 77 50.0 - 100.0 ug/mL  Glucose, capillary  Result Value Ref Range   Glucose-Capillary 129 (H) 65 - 99 mg/dL  Lactic acid, plasma  Result Value Ref Range   Lactic Acid, Venous 4.3 (HH) 0.5 - 1.9 mmol/L  Lactic acid, plasma  Result Value Ref Range   Lactic Acid, Venous 4.3 (HH) 0.5 - 1.9 mmol/L  Lipase, blood  Result Value Ref Range   Lipase 19 11 - 51 U/L  Glucose, capillary  Result Value Ref Range   Glucose-Capillary 93 65 - 99 mg/dL  Protime-INR  Result Value Ref Range   Prothrombin Time 13.9 11.4 - 15.2 seconds   INR 1.07   Acetaminophen level  Result Value Ref Range   Acetaminophen (Tylenol), Serum 17 10 - 30 ug/mL  Salicylate level  Result Value Ref Range   Salicylate Lvl <7.0 2.8 - 30.0 mg/dL  Glucose, capillary  Result Value Ref Range   Glucose-Capillary 168 (H) 65 -  99 mg/dL  HIV antibody (Routine Testing)  Result Value Ref Range   HIV Screen 4th Generation wRfx Non Reactive Non Reactive  Basic metabolic panel  Result Value Ref Range   Sodium 136 135 - 145 mmol/L   Potassium 5.5 (H) 3.5 - 5.1 mmol/L   Chloride 97 (L) 101 - 111 mmol/L   CO2 33 (H) 22 - 32 mmol/L   Glucose, Bld 120 (H) 65 - 99 mg/dL   BUN 8 6 - 20 mg/dL   Creatinine, Ser 4.090.71 0.44 - 1.00 mg/dL   Calcium 8.9 8.9 - 81.110.3 mg/dL   GFR calc non Af Amer >60 >60 mL/min   GFR calc Af Amer >60 >60 mL/min   Anion gap 6 5 - 15  CBC  Result Value Ref Range   WBC 5.4 3.6 - 11.0 K/uL   RBC 3.79 (L) 3.80 - 5.20 MIL/uL   Hemoglobin 12.0 12.0 - 16.0 g/dL   HCT 91.435.4 78.235.0 - 95.647.0 %   MCV 93.4 80.0 - 100.0 fL   MCH 31.7 26.0 - 34.0 pg   MCHC 33.9 32.0 - 36.0 g/dL   RDW 21.313.2 08.611.5 - 57.814.5 %   Platelets 197 150 - 440 K/uL  Hemoglobin A1c  Result Value Ref Range   Hgb A1c MFr Bld 6.0 (H) 4.8 - 5.6 %   Mean Plasma Glucose 126 mg/dL  Lactic acid, plasma  Result Value Ref Range   Lactic Acid, Venous 2.5 (HH) 0.5 - 1.9 mmol/L  Lactic acid, plasma  Result Value Ref Range   Lactic Acid, Venous 2.4 (HH) 0.5 - 1.9 mmol/L  Lactic acid, plasma  Result Value Ref Range   Lactic Acid, Venous 1.8 0.5 - 1.9 mmol/L  Glucose, capillary  Result Value Ref Range   Glucose-Capillary 136 (H) 65 - 99 mg/dL  Glucose, capillary  Result Value Ref Range   Glucose-Capillary 129 (H) 65 - 99 mg/dL  Lactic acid, plasma  Result Value Ref Range   Lactic Acid, Venous 2.9 (HH) 0.5 - 1.9 mmol/L  Lactic acid, plasma  Result Value Ref Range   Lactic Acid, Venous 2.0 (HH)  0.5 - 1.9 mmol/L  Lactic acid, plasma  Result Value Ref Range   Lactic Acid, Venous 1.7 0.5 - 1.9 mmol/L  Lactic acid, plasma  Result Value Ref Range   Lactic Acid, Venous 1.4 0.5 - 1.9 mmol/L  Potassium  Result Value Ref Range   Potassium 4.6 3.5 - 5.1 mmol/L  Glucose, capillary  Result Value Ref Range   Glucose-Capillary 113 (H) 65 - 99 mg/dL    Glucose, capillary  Result Value Ref Range   Glucose-Capillary 100 (H) 65 - 99 mg/dL  Lactic acid, plasma  Result Value Ref Range   Lactic Acid, Venous 1.8 0.5 - 1.9 mmol/L  Basic metabolic panel  Result Value Ref Range   Sodium 136 135 - 145 mmol/L   Potassium 4.5 3.5 - 5.1 mmol/L   Chloride 96 (L) 101 - 111 mmol/L   CO2 35 (H) 22 - 32 mmol/L   Glucose, Bld 127 (H) 65 - 99 mg/dL   BUN 7 6 - 20 mg/dL   Creatinine, Ser 9.60 0.44 - 1.00 mg/dL   Calcium 8.8 (L) 8.9 - 10.3 mg/dL   GFR calc non Af Amer >60 >60 mL/min   GFR calc Af Amer >60 >60 mL/min   Anion gap 5 5 - 15  Lactic acid, plasma  Result Value Ref Range   Lactic Acid, Venous 1.5 0.5 - 1.9 mmol/L  Glucose, capillary  Result Value Ref Range   Glucose-Capillary 106 (H) 65 - 99 mg/dL  Lactic acid, plasma  Result Value Ref Range   Lactic Acid, Venous 1.3 0.5 - 1.9 mmol/L  Glucose, capillary  Result Value Ref Range   Glucose-Capillary 112 (H) 65 - 99 mg/dL  Glucose, capillary  Result Value Ref Range   Glucose-Capillary 100 (H) 65 - 99 mg/dL   Comment 1 Notify RN    Comment 2 Document in Chart    Ct Head Wo Contrast  Result Date: 05/20/2017 CLINICAL DATA:  Head trauma with headache EXAM: CT HEAD WITHOUT CONTRAST TECHNIQUE: Contiguous axial images were obtained from the base of the skull through the vertex without intravenous contrast. COMPARISON:  None. FINDINGS: Brain: No evidence of acute infarction, hemorrhage, hydrocephalus, extra-axial collection or mass lesion/mass effect. Moderate atrophy. Patchy periventricular white matter hypodensity consistent with small vessel ischemic changes. Vascular: No hyperdense vessels.  Carotid artery calcifications. Skull: No fracture or suspicious bone lesion. Sinuses/Orbits: Mild mucosal thickening in the ethmoid sinuses. No acute orbital abnormality. Other: None IMPRESSION: 1. No CT evidence for acute intracranial abnormality 2. Atrophy with mild small vessel ischemic changes of the  white matter Electronically Signed   By: Jasmine Pang M.D.   On: 05/20/2017 22:30
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
## 2017-05-20 NOTE — ED Notes (Signed)
ED Provider at bedside. 

## 2017-05-21 MED ORDER — ONDANSETRON HCL 4 MG PO TABS: 4.0000 mg | ORAL_TABLET | Freq: Every day | ORAL | 0 refills | Status: AC | PRN

## 2017-09-22 DIAGNOSIS — M810 Age-related osteoporosis without current pathological fracture: Secondary | ICD-10-CM | POA: Insufficient documentation

## 2017-10-05 ENCOUNTER — Inpatient Hospital Stay: Admission: RE | Admit: 2017-10-05 | Payer: Medicare Other | Source: Ambulatory Visit

## 2017-10-08 ENCOUNTER — Other Ambulatory Visit: Payer: Self-pay | Admitting: Orthopedic Surgery

## 2017-10-08 DIAGNOSIS — M25551 Pain in right hip: Secondary | ICD-10-CM

## 2017-10-08 DIAGNOSIS — M1611 Unilateral primary osteoarthritis, right hip: Secondary | ICD-10-CM

## 2017-10-15 ENCOUNTER — Other Ambulatory Visit: Payer: Medicare Other

## 2017-10-15 ENCOUNTER — Emergency Department: Payer: Medicare Other

## 2017-10-15 ENCOUNTER — Emergency Department
Admission: EM | Admit: 2017-10-15 | Discharge: 2017-10-15 | Disposition: A | Discharge status: Home / Self Care | Payer: Medicare Other | Attending: Emergency Medicine | Admitting: Emergency Medicine

## 2017-10-15 ENCOUNTER — Other Ambulatory Visit: Payer: Self-pay

## 2017-10-15 ENCOUNTER — Ambulatory Visit
Admission: RE | Admit: 2017-10-15 | Discharge: 2017-10-15 | Disposition: A | Discharge status: Home / Self Care | Payer: Medicare Other | Source: Ambulatory Visit | Attending: Orthopedic Surgery | Admitting: Orthopedic Surgery

## 2017-10-15 DIAGNOSIS — Z96642 Presence of left artificial hip joint: Secondary | ICD-10-CM | POA: Diagnosis not present

## 2017-10-15 DIAGNOSIS — Z7901 Long term (current) use of anticoagulants: Secondary | ICD-10-CM | POA: Diagnosis not present

## 2017-10-15 DIAGNOSIS — M1611 Unilateral primary osteoarthritis, right hip: Secondary | ICD-10-CM

## 2017-10-15 DIAGNOSIS — Z9049 Acquired absence of other specified parts of digestive tract: Secondary | ICD-10-CM | POA: Insufficient documentation

## 2017-10-15 DIAGNOSIS — M25551 Pain in right hip: Secondary | ICD-10-CM

## 2017-10-15 DIAGNOSIS — Z96651 Presence of right artificial knee joint: Secondary | ICD-10-CM | POA: Diagnosis not present

## 2017-10-15 DIAGNOSIS — F419 Anxiety disorder, unspecified: Secondary | ICD-10-CM | POA: Diagnosis not present

## 2017-10-15 DIAGNOSIS — Z7984 Long term (current) use of oral hypoglycemic drugs: Secondary | ICD-10-CM | POA: Insufficient documentation

## 2017-10-15 DIAGNOSIS — F319 Bipolar disorder, unspecified: Secondary | ICD-10-CM | POA: Insufficient documentation

## 2017-10-15 DIAGNOSIS — I2510 Atherosclerotic heart disease of native coronary artery without angina pectoris: Secondary | ICD-10-CM | POA: Insufficient documentation

## 2017-10-15 DIAGNOSIS — K439 Ventral hernia without obstruction or gangrene: Secondary | ICD-10-CM

## 2017-10-15 DIAGNOSIS — Z79899 Other long term (current) drug therapy: Secondary | ICD-10-CM | POA: Diagnosis not present

## 2017-10-15 DIAGNOSIS — Z9884 Bariatric surgery status: Secondary | ICD-10-CM | POA: Insufficient documentation

## 2017-10-15 DIAGNOSIS — R109 Unspecified abdominal pain: Secondary | ICD-10-CM | POA: Diagnosis present

## 2017-10-15 DIAGNOSIS — K469 Unspecified abdominal hernia without obstruction or gangrene: Secondary | ICD-10-CM | POA: Diagnosis not present

## 2017-10-15 DIAGNOSIS — J449 Chronic obstructive pulmonary disease, unspecified: Secondary | ICD-10-CM | POA: Diagnosis not present

## 2017-10-15 DIAGNOSIS — E119 Type 2 diabetes mellitus without complications: Secondary | ICD-10-CM | POA: Insufficient documentation

## 2017-10-15 LAB — CBC
HEMATOCRIT: 36.1 % (ref 35.0–47.0)
HEMOGLOBIN: 11.9 g/dL — AB (ref 12.0–16.0)
MCH: 29.9 pg (ref 26.0–34.0)
MCHC: 33 g/dL (ref 32.0–36.0)
MCV: 90.5 fL (ref 80.0–100.0)
Platelets: 251 10*3/uL (ref 150–440)
RBC: 3.98 MIL/uL (ref 3.80–5.20)
RDW: 14 % (ref 11.5–14.5)
WBC: 4.7 10*3/uL (ref 3.6–11.0)

## 2017-10-15 LAB — URINALYSIS, COMPLETE (UACMP) WITH MICROSCOPIC
BILIRUBIN URINE: NEGATIVE
Bacteria, UA: NONE SEEN
Glucose, UA: NEGATIVE mg/dL
HGB URINE DIPSTICK: NEGATIVE
Ketones, ur: NEGATIVE mg/dL
Leukocytes, UA: NEGATIVE
NITRITE: NEGATIVE
PROTEIN: NEGATIVE mg/dL
Specific Gravity, Urine: 1.016 (ref 1.005–1.030)
pH: 7 (ref 5.0–8.0)

## 2017-10-15 LAB — COMPREHENSIVE METABOLIC PANEL
ALT: 13 U/L — ABNORMAL LOW (ref 14–54)
ANION GAP: 10 (ref 5–15)
AST: 18 U/L (ref 15–41)
Albumin: 3.5 g/dL (ref 3.5–5.0)
Alkaline Phosphatase: 54 U/L (ref 38–126)
BUN: 12 mg/dL (ref 6–20)
CALCIUM: 8.9 mg/dL (ref 8.9–10.3)
CO2: 29 mmol/L (ref 22–32)
Chloride: 95 mmol/L — ABNORMAL LOW (ref 101–111)
Creatinine, Ser: 0.7 mg/dL (ref 0.44–1.00)
GFR calc non Af Amer: >60 mL/min (ref >60)
Glucose, Bld: 87 mg/dL (ref 65–99)
POTASSIUM: 4.5 mmol/L (ref 3.5–5.1)
SODIUM: 134 mmol/L — AB (ref 135–145)
TOTAL PROTEIN: 6.7 g/dL (ref 6.5–8.1)
Total Bilirubin: 0.4 mg/dL (ref 0.3–1.2)

## 2017-10-15 LAB — LIPASE, BLOOD: LIPASE: 22 U/L (ref 11–51)

## 2017-10-15 MED ORDER — IOPAMIDOL (ISOVUE-300) INJECTION 61%
30.0000 mL | Freq: Once | INTRAVENOUS | Status: AC | PRN
  Administered 2017-10-15: 30 mL via ORAL
  Filled 2017-10-15: qty 30

## 2017-10-15 MED ORDER — IOPAMIDOL (ISOVUE-300) INJECTION 61%
100.0000 mL | Freq: Once | INTRAVENOUS | Status: AC | PRN
  Administered 2017-10-15: 100 mL via INTRAVENOUS
  Filled 2017-10-15: qty 100

## 2017-10-15 NOTE — Discharge Instructions (Signed)
Please seek medical attention for any high fevers, chest pain, shortness of breath, change in behavior, persistent vomiting, bloody stool or any other new or concerning symptoms.  

## 2017-10-15 NOTE — ED Notes (Signed)
Pt presents today for abd pain that she says is worsen then "normal". While in transport pt asked "why don't I have yellow bracelet on I am a high fall risk". Pt was placed in bed by this RN and Morrie SheldonAshley RN side rails are up stretcher lowest position yellow bractlet applied.

## 2017-10-15 NOTE — ED Notes (Signed)
Pt laughing and joking in triage.

## 2017-10-15 NOTE — ED Triage Notes (Signed)
Pt to ER via POV c/o right abdominal pain X 1 month, states today that pain is worsening. Pain is described as tearing pain and burning. Pt alert and oriented X4, active, cooperative, pt in NAD. RR even and unlabored, color WNL.

## 2017-10-15 NOTE — ED Provider Notes (Signed)
[facility] Emergency Department Provider Note   ____________________________________________   I have reviewed the triage vital signs and the nursing notes.   HISTORY  Chief Complaint Abdominal Pain   History limited by: Not Limited   HPI Amy Sweeney is a 67 y.o. female who presents to the emergency department today because of concerns for abdominal pain.  Is located in the right mid abdomen.  It has been present for 1 year.  Has been worse for 1 month.  It is severe today.  It does come and go.  It is worse with certain movements.  The patient states that she just felt like today she had to come in because of excessive ear.  She did have one episode of associated vomiting last night.  She states she is not talked to her primary care doctor about this over the past year.  Does have history of gastric bypass and cholecystectomy.    Per medical record review patient has a history of gastric bypass, cholecystectomy.  Past Medical History:  Diagnosis Date  . Anxiety   . Arthritis    degenerative artgritis  osteoarthritis  . Bipolar disorder (HCC)   . COPD (chronic obstructive pulmonary disease) (HCC)   . Coronary artery disease   . DDD (degenerative disc disease), cervical   . Diabetes mellitus without complication (HCC)   . Dyspnea   . Family history of adverse reaction to anesthesia    father blood clot after surgery died  . Headache    history of migraines  . History of pulmonary embolus (PE)   . Lower extremity deep venous thrombosis (HCC)   . Lower extremity edema   . 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 Night terrors   . OCD (obsessive compulsive disorder)   . Osteoarthritis   . Osteoporosis   . PTSD (post-traumatic stress disorder)     Patient Active Problem List   Diagnosis Date Noted  . Metformin overdose 04/01/2017  . COPD (chronic obstructive  pulmonary disease) (HCC) 04/01/2017  . Diabetes (HCC) 04/01/2017  . Bipolar disorder (HCC) 04/01/2017  . Hypercoagulable state (HCC) 09/28/2016  . DJD (degenerative joint disease) 09/28/2016  . Primary localized osteoarthritis of left hip 07/30/2016  . History of DVT (deep vein thrombosis) 07/16/2016  . Traumatic arthritis of knee 05/30/2015    Past Surgical History:  Procedure Laterality Date  . CARDIAC CATHETERIZATION  2004  . CERVICAL LAMINOPLASTY    . CESAREAN SECTION     x2  . CHOLECYSTECTOMY    . DILATION AND CURETTAGE OF UTERUS    . GASTRIC BYPASS  2004  . IVC FILTER PLACEMENT ([facility] HX)    . KNEE ARTHROSCOPY Left 2004 & 2006  . LAMINECTOMY  2014   lumbar 3-6 sacral 1-3  fusion  . NECK SURGERY      times 2  . PERIPHERAL VASCULAR CATHETERIZATION N/A 05/21/2015   Procedure: IVC Filter Insertion;  Surgeon: Renford Dills, MD;  Location: [facility] INVASIVE CV LAB;  Service: Cardiovascular;  Laterality: N/A;  . PERIPHERAL VASCULAR CATHETERIZATION N/A 07/23/2015   Procedure: IVC Filter Removal;  Surgeon: Renford Dills, MD;  Location: [facility] INVASIVE CV LAB;  Service: Cardiovascular;  Laterality: N/A;  . PERIPHERAL VASCULAR CATHETERIZATION N/A 07/21/2016   Procedure: IVC Filter Insertion;  Surgeon: Renford Dills, MD;  Location: [facility] INVASIVE CV LAB;  Service: Cardiovascular;  Laterality: N/A;  . PERIPHERAL VASCULAR CATHETERIZATION N/A 10/16/2016  Procedure: IVC Filter Removal;  Surgeon: Renford Dills, MD;  Location: [facility] INVASIVE CV LAB;  Service: Cardiovascular;  Laterality: N/A;  . TOTAL HIP ARTHROPLASTY Left 07/30/2016   Procedure: TOTAL HIP ARTHROPLASTY ANTERIOR APPROACH;  Surgeon: Kennedy Bucker, MD;  Location: [facility] ORS;  Service: Orthopedics;  Laterality: Left;  . TOTAL KNEE ARTHROPLASTY Right 05/30/2015   Procedure: TOTAL KNEE ARTHROPLASTY;  Surgeon: Kennedy Bucker, MD;  Location: [facility] ORS;  Service: Orthopedics;  Laterality: Right;    Prior to Admission medications    Medication Sig Start Date End Date Taking? Authorizing Provider  acetaminophen (TYLENOL) 500 MG tablet Take 1,500 mg by mouth 3 (three) times daily. With Gabapentin    [provider]  apixaban (ELIQUIS) 5 MG TABS tablet Take 5 mg by mouth every 12 (twelve) hours.     [provider]  baclofen (LIORESAL) 10 MG tablet Take 5 mg by mouth 2 (two) times daily.     [provider]  CALCIUM CITRATE PO Take 2,400 mg by mouth daily at 2 PM.     [provider]  Cholecalciferol (VITAMIN D3) 5000 UNITS CAPS Take 25,000 Units by mouth daily at 2 PM.     [provider]  clonazePAM (KLONOPIN) 0.5 MG tablet Take 0.5 mg by mouth 2 (two) times daily.    [provider]  Cyanocobalamin (VITAMIN B-12) 2500 MCG SUBL Place 5,000 mcg under the tongue daily at 2 PM.     [provider]  denosumab (PROLIA) 60 MG/ML SOLN injection Inject 60 mg into the skin every 6 (six) months. Administer in upper arm, thigh, or abdomen    [provider]  diphenhydrAMINE (SOMINEX) 25 MG tablet Take 50 mg by mouth at bedtime. 50 mg at bedtime, if patient is manic due to BIPOLAR 1 she will take 100 mg at bedtime.    [provider]  divalproex (DEPAKOTE ER) 500 MG 24 hr tablet Take 1,500 mg by mouth daily.     [provider]  DULoxetine (CYMBALTA) 60 MG capsule Take 120 mg by mouth daily.     [provider]  fentaNYL (DURAGESIC - DOSED MCG/HR) 75 MCG/HR Place 1 patch (75 mcg total) onto the skin every 3 (three) days. 08/01/16   Dedra Skeens, PA-C  ferrous sulfate 325 (65 FE) MG tablet Take 650 mg by mouth daily at 2 PM.     [provider]  fluticasone (FLONASE) 50 MCG/ACT nasal spray Place 1 spray into both nostrils 2 (two) times daily.     [provider]  gabapentin (NEURONTIN) 400 MG capsule Take 1,200 mg by mouth 3 (three) times daily. With Tylenol 1500 mg 05/24/16   [provider]  Ginkgo Biloba 60 MG CAPS  Take 120 mg by mouth daily at 2 PM.     [provider]  Magnesium Oxide -Mg Supplement 250 MG TABS Take 500 mg by mouth daily at 2 PM.     [provider]  Melatonin 1 MG TABS Take 1 tablet (1 mg total) by mouth at bedtime as needed. Patient taking differently: Take 1 mg by mouth at bedtime.  03/11/16   Cuthriell, Delorise Royals, PA-C  metFORMIN (GLUCOPHAGE) 1000 MG tablet Take 2,000 mg by mouth daily with breakfast.     [provider]  Multiple Vitamins-Minerals (ONE-A-DAY WOMENS 50 PLUS PO) Take 2 tablets by mouth daily at 2 PM.     [provider]  nitroGLYCERIN (NITROSTAT) 0.4 MG SL tablet Place 0.4  mg under the tongue every 5 (five) minutes as needed. For chest pain.    [provider]  ondansetron (ZOFRAN) 4 MG tablet Take 1 tablet (4 mg total) by mouth daily as needed for nausea or vomiting. 05/21/17 05/21/18  Merrily Brittle, MD  oxyCODONE (ROXICODONE) 15 MG immediate release tablet Take 15 mg by mouth 3 (three) times daily. 03/15/17   [provider]  protein supplement shake (PREMIER PROTEIN) LIQD Take 11 oz by mouth daily with breakfast. CARAMEL OR STRAWBERRY PREFERRED    [provider]  traZODone (DESYREL) 100 MG tablet Take 1 tablet (100 mg total) by mouth at bedtime. 03/11/16   Cuthriell, Delorise Royals, PA-C  vitamin E 1000 UNIT capsule Take 2,000 Units by mouth at bedtime.     [provider]  Zinc 50 MG TABS Take 100 mg by mouth daily at 2 PM.     [provider]    Allergies Bee venom; Morphine and related; Adhesive [tape]; Celebrex [celecoxib]; Erythromycin; Tegaderm ag mesh [silver]; and Grape seed  No family history on file.  Social History Social History   Tobacco Use  . Smoking status: Never Smoker  . Smokeless tobacco: Never Used  Substance Use Topics  . Alcohol use: No  . Drug use: No    Review of Systems Constitutional: No fever/chills Eyes: No visual changes. ENT: No sore  throat. Cardiovascular: Denies chest pain. Respiratory: Denies shortness of breath. Gastrointestinal: Positive for abdominal pain. Positive for nausea.  Genitourinary: Negative for dysuria. Musculoskeletal: Negative for back pain. Skin: Negative for rash. Neurological: Negative for headaches, focal weakness or numbness.  ____________________________________________   PHYSICAL EXAM:  VITAL SIGNS: ED Triage Vitals  Enc Vitals Group     BP 10/15/17 1647 (!) 152/109     Pulse Rate 10/15/17 1647 83     Resp 10/15/17 1647 18     Temp 10/15/17 1647 98.8 F (37.1 C)     Temp Source 10/15/17 1647 Oral     SpO2 10/15/17 1647 97 %     Weight 10/15/17 1648 240 lb (108.9 kg)     Height --      Head Circumference --      Peak Flow --      Pain Score 10/15/17 1648 10    Constitutional: Alert and oriented. Well appearing and in no distress. Eyes: Conjunctivae are normal.  ENT   Head: Normocephalic and atraumatic.   Nose: No congestion/rhinnorhea.   Mouth/Throat: Mucous membranes are moist.   Neck: No stridor. Hematological/Lymphatic/Immunilogical: No cervical lymphadenopathy. Cardiovascular: Normal rate, regular rhythm.  No murmurs, rubs, or gallops.  Respiratory: Normal respiratory effort without tachypnea nor retractions. Breath sounds are clear and equal bilaterally. No wheezes/rales/rhonchi. Gastrointestinal: Soft and tender to palpation somewhat diffusely. No rebound. No guarding.  Genitourinary: Deferred Musculoskeletal: Normal range of motion in all extremities. No lower extremity edema. Neurologic:  Normal speech and language. No gross focal neurologic deficits are appreciated.  Skin:  Skin is warm, dry and intact. No rash noted. Psychiatric: Mood and affect are normal. Speech and behavior are normal. Patient exhibits appropriate insight and judgment.  ____________________________________________    LABS (pertinent positives/negatives)  Lipase 22 CBC wbc 4.7,  hgb 11.9, plt 251 CMP na 134, k 4.5, glu 87  ____________________________________________   EKG  None  ____________________________________________    RADIOLOGY  CT abd./pel Fat containing abdominal wall hernias  ____________________________________________   PROCEDURES  Procedures  ____________________________________________   INITIAL IMPRESSION / ASSESSMENT AND PLAN /  ED COURSE  Pertinent labs & imaging results that were available during my care of the patient were reviewed by me and considered in my medical decision making (see chart for details).  Patient presents to the emergency department today because of concerns for abdominal pain that has been present for 1 year.  She does state is been getting more severe recently.  On exam she is mildly tender to palpation in the abdomen but it is certainly soft with no rebound or guarding.  Blood work without any concerning signs for leukocytosis, pancreatitis, hepatitis.  Urine not consistent with kidney stone or infection.  CT scan was obtained given patient's concern for severe abdominal pain.  This did show a couple abdominal wall hernias however they were only fat-containing.  I discussed with patient that this could potentially be the cause of her pain.  Discussed importance of following up with surgery.  Will give surgery follow-up.  Discussed return precautions.  The   ____________________________________________   FINAL CLINICAL IMPRESSION(S) / ED DIAGNOSES  Final diagnoses:  Abdominal pain, unspecified abdominal location  Hernia of anterior abdominal wall     Note: This dictation was prepared with Dragon dictation. Any transcriptional errors that result from this process are unintentional      Phineas SemenGoodman, Skilynn Durney, MD 10/15/17 2241

## 2017-10-16 ENCOUNTER — Ambulatory Visit: Payer: Medicare Other

## 2017-10-19 ENCOUNTER — Other Ambulatory Visit: Payer: Self-pay | Admitting: Orthopedic Surgery

## 2017-10-20 ENCOUNTER — Ambulatory Visit: Payer: Medicare Other

## 2017-10-22 ENCOUNTER — Telehealth: Payer: Self-pay | Admitting: General Practice

## 2017-10-22 NOTE — Telephone Encounter (Signed)
Left a message for the patient to call the office. °

## 2017-10-22 NOTE — Telephone Encounter (Signed)
-----   Message from Nicole KindredAngela K Brouillard sent at 10/22/2017 12:29 PM EST ----- Regarding: ED referral ED follow up-new patient -abdominal wall hernias-any surgeon.

## 2017-10-22 NOTE — Telephone Encounter (Signed)
Returned call to patient at this time and advised her that we were trying to see her for a follow up after being seen in the Ed. Patient advised that she has an appointment with Dr. Katrinka BlazingSmith. She asked if our office is apart of [place] and I advised that we are. She became upset and stayed that she does not affiliate with [facility] at all. I verbalized understanding at this time.

## 2017-11-10 DIAGNOSIS — R1900 Intra-abdominal and pelvic swelling, mass and lump, unspecified site: Secondary | ICD-10-CM | POA: Insufficient documentation

## 2017-11-15 IMAGING — CR DG THORACIC SPINE 2V
3 series · 3 of 3 positions shown · non-contrast
Comparison: Thoracic spine MRI 07/30/2014.

CLINICAL DATA: 64-year-old female with chronic mid back pain for
several years radiating bilaterally. No known injury. Subsequent
encounter.

EXAM:
THORACIC SPINE 2 VIEWS

[t-spine ap]
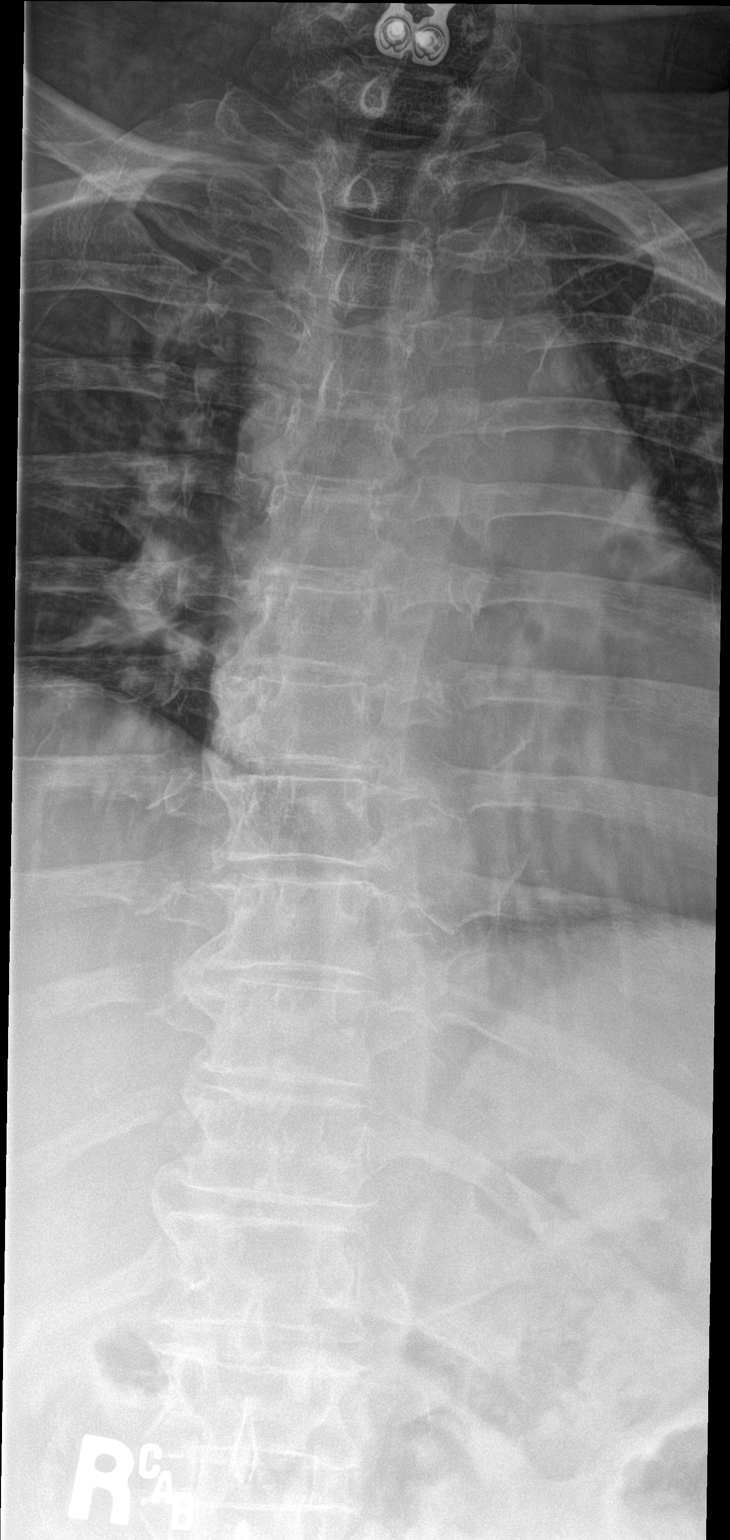

[t-spine lat]
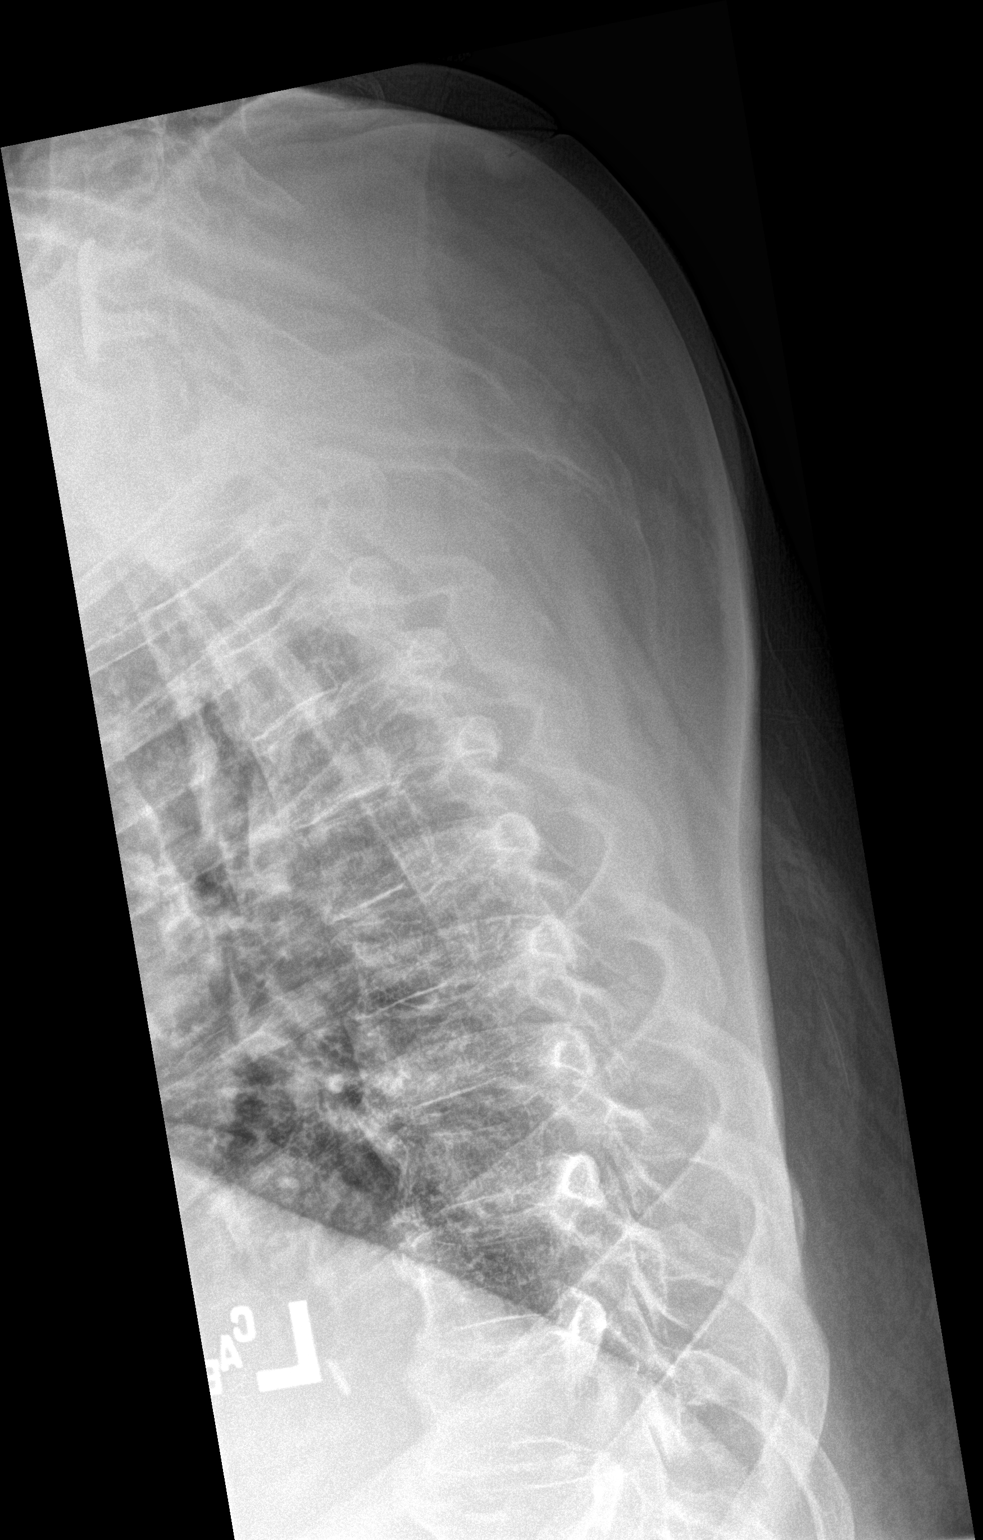

[t-spine swimmers]
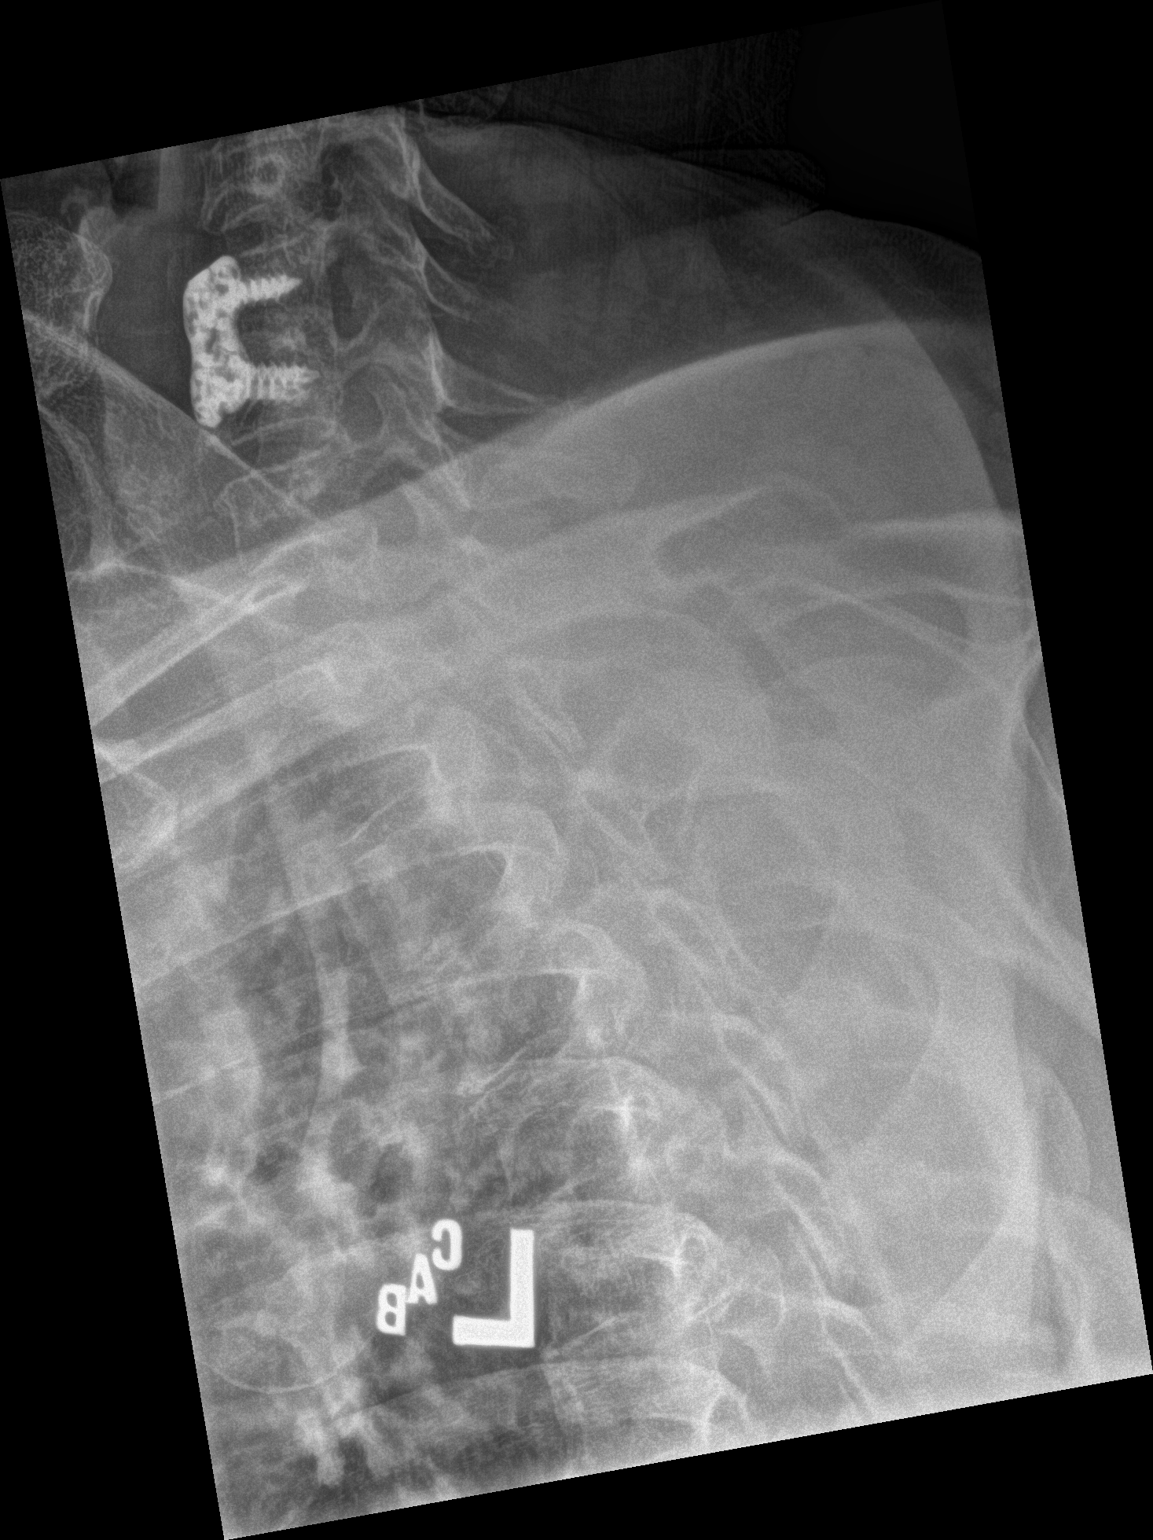

[3 of 3 positions shown; findings below may reference images not displayed]

FINDINGS: Partially visible lower cervical ACDF. Osteopenia. Normal thoracic
segmentation. Cervicothoracic junction alignment is within normal
limits. Thoracic vertebral height and alignment appears stable since
9885. Chronic degenerative endplate spurring. Stable disc spaces.
Grossly intact posterior ribs. Grossly negative visualized thoracic
visceral contours.
IMPRESSION: 1.  No acute osseous abnormality identified in the thoracic spine.
2. If there is clinical suspicion of occult compression fracture
Nuclear Medicine Whole-body Bone Scan or repeat Thoracic Spine MRI
would best evaluate further.

## 2017-11-30 DIAGNOSIS — E119 Type 2 diabetes mellitus without complications: Secondary | ICD-10-CM | POA: Insufficient documentation

## 2018-01-12 ENCOUNTER — Other Ambulatory Visit: Payer: Self-pay | Admitting: Family Medicine

## 2018-01-12 DIAGNOSIS — R51 Headache: Principal | ICD-10-CM

## 2018-01-12 DIAGNOSIS — R519 Headache, unspecified: Secondary | ICD-10-CM

## 2018-01-17 ENCOUNTER — Ambulatory Visit
Admission: RE | Admit: 2018-01-17 | Discharge: 2018-01-17 | Disposition: A | Discharge status: Home / Self Care | Payer: Medicare Other | Source: Ambulatory Visit | Attending: Family Medicine | Admitting: Family Medicine

## 2018-01-17 DIAGNOSIS — E871 Hypo-osmolality and hyponatremia: Secondary | ICD-10-CM | POA: Insufficient documentation

## 2018-01-17 DIAGNOSIS — R51 Headache: Secondary | ICD-10-CM | POA: Insufficient documentation

## 2018-01-17 DIAGNOSIS — R519 Headache, unspecified: Secondary | ICD-10-CM

## 2018-02-17 ENCOUNTER — Other Ambulatory Visit: Payer: Self-pay | Admitting: Internal Medicine

## 2018-02-17 DIAGNOSIS — Z1231 Encounter for screening mammogram for malignant neoplasm of breast: Secondary | ICD-10-CM

## 2018-02-22 ENCOUNTER — Other Ambulatory Visit: Payer: Self-pay

## 2018-02-22 ENCOUNTER — Ambulatory Visit (HOSPITAL_BASED_OUTPATIENT_CLINIC_OR_DEPARTMENT_OTHER): Payer: Medicare Other | Admitting: Pain Medicine

## 2018-02-22 ENCOUNTER — Encounter: Payer: Self-pay | Admitting: Pain Medicine

## 2018-02-22 ENCOUNTER — Ambulatory Visit
Admission: RE | Admit: 2018-02-22 | Discharge: 2018-02-22 | Disposition: A | Discharge status: Home / Self Care | Payer: Medicare Other | Source: Ambulatory Visit | Attending: Pain Medicine | Admitting: Pain Medicine

## 2018-02-22 VITALS — BP 96/54 | HR 72 | Temp 97.9°F | Resp 16 | Ht 68.0 in | Wt 238.0 lb

## 2018-02-22 DIAGNOSIS — G894 Chronic pain syndrome: Secondary | ICD-10-CM

## 2018-02-22 DIAGNOSIS — Z79899 Other long term (current) drug therapy: Secondary | ICD-10-CM | POA: Insufficient documentation

## 2018-02-22 DIAGNOSIS — Z96642 Presence of left artificial hip joint: Secondary | ICD-10-CM | POA: Insufficient documentation

## 2018-02-22 DIAGNOSIS — M25551 Pain in right hip: Secondary | ICD-10-CM | POA: Insufficient documentation

## 2018-02-22 DIAGNOSIS — Z0289 Encounter for other administrative examinations: Secondary | ICD-10-CM

## 2018-02-22 DIAGNOSIS — G8929 Other chronic pain: Secondary | ICD-10-CM

## 2018-02-22 DIAGNOSIS — Z7901 Long term (current) use of anticoagulants: Secondary | ICD-10-CM

## 2018-02-22 DIAGNOSIS — S82109A Unspecified fracture of upper end of unspecified tibia, initial encounter for closed fracture: Secondary | ICD-10-CM | POA: Insufficient documentation

## 2018-02-22 DIAGNOSIS — M1611 Unilateral primary osteoarthritis, right hip: Secondary | ICD-10-CM | POA: Insufficient documentation

## 2018-02-22 DIAGNOSIS — Z79891 Long term (current) use of opiate analgesic: Secondary | ICD-10-CM | POA: Diagnosis not present

## 2018-02-22 DIAGNOSIS — M5030 Other cervical disc degeneration, unspecified cervical region: Secondary | ICD-10-CM | POA: Insufficient documentation

## 2018-02-22 MED ORDER — ROPIVACAINE HCL 2 MG/ML IJ SOLN
INTRAMUSCULAR | Status: AC
  Filled 2018-02-22: qty 10

## 2018-02-22 MED ORDER — METHYLPREDNISOLONE ACETATE 80 MG/ML IJ SUSP
80.0000 mg | Freq: Once | INTRAMUSCULAR | Status: AC
  Administered 2018-02-22: 80 mg via INTRA_ARTICULAR
  Filled 2018-02-22: qty 1

## 2018-02-22 MED ORDER — IOPAMIDOL (ISOVUE-M 200) INJECTION 41%
10.0000 mL | Freq: Once | INTRAMUSCULAR | Status: AC
  Administered 2018-02-22: 10 mL via INTRA_ARTICULAR
  Filled 2018-02-22: qty 10

## 2018-02-22 MED ORDER — MIDAZOLAM HCL 5 MG/5ML IJ SOLN
1.0000 mg | INTRAMUSCULAR | Status: DC | PRN
  Administered 2018-02-22: 2 mg via INTRAVENOUS
  Filled 2018-02-22 (×2): qty 5

## 2018-02-22 MED ORDER — IOPAMIDOL (ISOVUE-M 200) INJECTION 41%
INTRAMUSCULAR | Status: AC
  Filled 2018-02-22: qty 10

## 2018-02-22 MED ORDER — LACTATED RINGERS IV SOLN
1000.0000 mL | Freq: Once | INTRAVENOUS | Status: AC
  Administered 2018-02-22: 1000 mL via INTRAVENOUS

## 2018-02-22 MED ORDER — ROPIVACAINE HCL 2 MG/ML IJ SOLN
4.0000 mL | Freq: Once | INTRAMUSCULAR | Status: AC
  Administered 2018-02-22: 10 mL via INTRA_ARTICULAR
  Filled 2018-02-22: qty 10

## 2018-02-22 MED ORDER — FENTANYL CITRATE (PF) 100 MCG/2ML IJ SOLN
25.0000 ug | INTRAMUSCULAR | Status: DC | PRN
  Administered 2018-02-22: 50 ug via INTRAVENOUS
  Filled 2018-02-22 (×2): qty 2

## 2018-02-22 MED ORDER — LIDOCAINE HCL 2 % IJ SOLN
20.0000 mL | Freq: Once | INTRAMUSCULAR | Status: AC
  Administered 2018-02-22: 400 mg
  Filled 2018-02-22: qty 20

## 2018-02-22 MED ORDER — LIDOCAINE HCL (PF) 2 % IJ SOLN
INTRAMUSCULAR | Status: AC
  Filled 2018-02-22: qty 10

## 2018-02-22 MED ORDER — METHYLPREDNISOLONE ACETATE 80 MG/ML IJ SUSP
INTRAMUSCULAR | Status: AC
  Filled 2018-02-22: qty 1

## 2018-02-22 NOTE — Progress Notes (Addendum)
Patient's Name: Amy Sweeney  MRN: [id number]  Referring Provider: Renata Caprice  DOB: 09-23-1950  PCP: Tracie Harrier, MD  DOS: 02/22/2018  Note by: Gaspar Cola, MD  Service setting: Ambulatory outpatient  Specialty: Interventional Pain Management  Location: [facility] (AMB) Pain Management Facility    Patient type: New patient ("FAST-TRACK") Evaluation + Procedure   Warning: This referral option does not include the extensive pharmacological evaluation required for [place] to take over the patient's medication management. The "Fast-Track" system is designed to bypass the new patient referral waiting list, as well as the normal patient evaluation process, in order to provide a patient in distress with a timely pain management intervention. Because the system was not designed to unfairly get a patient into our pain practice ahead of those already waiting, certain restrictions apply. By requesting a "Fast-Track" consult, the referring physician has opted to continue managing the patient's medications in order to get interventional urgent care.  Primary Reason for Visit: Interventional Pain Management Treatment. CC: Hip Pain (right)  Procedure:       Anesthesia, Analgesia, Anxiolysis:  Type: Intra-Articular Hip Injection #1  Primary Purpose: Diagnostic/Therapeutic Region: Posterolateral hip joint area. Level: Lower pelvic and hip joint level. Target Area: Superior aspect of the hip joint cavity, going thru the superior portion of the capsular ligament. Approach: Posterolateral approach. Laterality: Right-Sided  Type: Moderate (Conscious) Sedation combined with Local Anesthesia Indication(s): Analgesia and Anxiety Route: Intravenous (IV) IV Access: Secured Sedation: Meaningful verbal contact was maintained at all times during the procedure  Local Anesthetic: Lidocaine 1-2%  Position: Lateral Decubitus with bad side up Prepped Area: Entire Posterolateral hip area. Prepping  solution: ChloraPrep (2% chlorhexidine gluconate and 70% isopropyl alcohol)   Indications: 1. Osteoarthritis of hip (Right)   2. Chronic hip pain, right   3. Chronic anticoagulation (Eliquis)    Pain Score: Pre-procedure: 7 /10 Post-procedure: 0-No pain/10  HPI  Amy Sweeney is a 67 y.o. year old, female patient, who comes today for a  "Fast-Track" new patient evaluation, as requested by Duanne Guess, PA-C. The patient has been made aware that this type of referral option is reserved for the Interventional Pain Management portion of our practice and completely excludes the option of medication management. Her primarily concern today is the Hip Pain (right)  Pain Assessment: Location: Right Hip Radiating: denies Onset: More than a month ago Duration: Chronic pain Quality: Burning, Pressure Severity: 7 /10 (subjective, self-reported pain score)  Note: Reported level is inconsistent with clinical observations. Clinically the patient looks like a 5/10 A 5/10 is viewed as "Severe" and described as intense and extremely unpleasant. Associated with frowning face and frequent crying. Pain overwhelms the senses.  Ability to do any activity or maintain social relationships becomes significantly limited. Conversation becomes difficult. Pacing back and forth is common, as getting into a comfortable position is nearly impossible. Pain wakes you up from deep sleep. Physical signs will be obvious: pupillary dilation; increased sweating; goosebumps; brisk reflexes; cold, clammy hands and feet; nausea, vomiting or dry heaves; loss of appetite; significant sleep disturbance with inability to fall asleep or to remain asleep. When persistent, significant weight loss is observed due to the complete loss of appetite and sleep deprivation.  Blood pressure and heart rate becomes significantly elevated. Information on the proper use of the pain scale provided to the patient today. When using our objective Pain  Scale, levels between 6 and 10/10 are said to belong in an emergency room, as  it progressively worsens from a 6/10, described as severely limiting, requiring emergency care not usually available at an outpatient pain management facility. At a 6/10 level, communication becomes difficult and requires great effort. Assistance to reach the emergency department may be required. Facial flushing and profuse sweating along with potentially dangerous increases in heart rate and blood pressure will be evident. Effect on ADL: difficulty walking, difficulty performing daily tasks Timing: Intermittent Modifying factors: medications BP: (!) 96/54  HR: 72  Onset and Duration: Gradual and Date of injury: 31yr Cause of pain: Arthritis Severity: NAS-11 at its worse: 10/10, NAS-11 at its best: 02/10, NAS-11 now: 07/10 and NAS-11 on the average: 08/10 Timing: Not influenced by the time of the day and After a period of immobility Aggravating Factors: Prolonged sitting, Prolonged standing and Walking Alleviating Factors: Lying down, Medications and Resting Associated Problems: Constipation, Depression, Inability to concentrate, Numbness, Sadness, Spasms, Swelling, Tingling, Weakness, Pain that wakes patient up and Pain that does not allow patient to sleep Quality of Pain: Burning, Intermittent, Deep, Exhausting, Feeling of constriction, Feeling of weight, Getting longer, Horrible, Hot, Pressure-like, Sharp and Shooting Previous Examinations or Tests: MRI scan and X-rays Previous Treatments: Narcotic medications  The patient comes into the clinics today, referred [place]-articular [place] joint injection by Dr. [place]ie Knows([place]. The patient indicates that she is [facility]. She has multiple medical and chronic pain problems but today we will be concentrating only on her right hip pain. She indicates that she is a candidate for  right hip replacement. She indicates that her left hip was already replaced and this has considerably helped her pain. She also indicates taking a considerable amount of narcotics, all of which are prescribed by the [facility] pain clinic. She was concerned not to violate her medication agreement and therefore I had her call the [facility] pain clinic to make sure that this was not the case. She did and they indicated that receiving sedation during our procedure would not constitute a breach of contract.  Meds   Current Outpatient Medications:  .  acetaminophen (TYLENOL) 500 MG tablet, Take 1,500 mg by mouth 3 (three) times daily. With Gabapentin, Disp: , Rfl:  .  apixaban (ELIQUIS) 5 MG TABS tablet, Take 5 mg by mouth every 12 (twelve) hours. , Disp: , Rfl:  .  baclofen (LIORESAL) 10 MG tablet, Take 5 mg by mouth 2 (two) times daily. , Disp: , Rfl:  .  CALCIUM CITRATE PO, Take 2,400 mg by mouth daily at 2 PM. , Disp: , Rfl:  .  Cholecalciferol (VITAMIN D3) 5000 UNITS CAPS, Take 25,000 Units by mouth daily at 2 PM. , Disp: , Rfl:  .  Cyanocobalamin (VITAMIN B-12) 2500 MCG SUBL, Place 5,000 mcg under the tongue daily at 2 PM. , Disp: , Rfl:  .  denosumab (PROLIA) 60 MG/ML SOLN injection, Inject 60 mg into the skin every 6 (six) months. Administer in upper arm, thigh, or abdomen, Disp: , Rfl:  .  diphenhydrAMINE (SOMINEX) 25 MG tablet, Take 50 mg by mouth at bedtime. 50 mg at bedtime, if patient is manic due to BIPOLAR 1 she will take 100 mg at bedtime., Disp: , Rfl:  .  divalproex (DEPAKOTE ER) 500 MG 24 hr tablet, Take 1,500 mg by mouth daily. , Disp: , Rfl:  .  DULoxetine (CYMBALTA) 60 MG capsule, Take 120 mg by mouth daily. ,  Disp: , Rfl:  .  ferrous sulfate 325 (65 FE) MG tablet, Take 650 mg by mouth daily at 2 PM. , Disp: , Rfl:  .  fluticasone (FLONASE) 50 MCG/ACT nasal spray, Place 1 spray into both nostrils 2 (two) times daily. , Disp: , Rfl:  .  gabapentin (NEURONTIN) 400 MG capsule, Take 1,200 mg by  mouth 3 (three) times daily. With Tylenol 1500 mg, Disp: , Rfl:  .  Ginkgo Biloba 60 MG CAPS, Take 120 mg by mouth daily at 2 PM. , Disp: , Rfl:  .  metFORMIN (GLUCOPHAGE) 1000 MG tablet, Take 2,000 mg by mouth daily with breakfast. , Disp: , Rfl:  .  metoprolol tartrate (LOPRESSOR) 25 MG tablet, Take 25 mg by mouth 2 (two) times daily., Disp: , Rfl:  .  Multiple Vitamins-Minerals (ONE-A-DAY WOMENS 50 PLUS PO), Take 2 tablets by mouth daily at 2 PM. , Disp: , Rfl:  .  nitroGLYCERIN (NITROSTAT) 0.4 MG SL tablet, Place 0.4 mg under the tongue every 5 (five) minutes as needed. For chest pain., Disp: , Rfl:  .  olmesartan (BENICAR) 40 MG tablet, Take 40 mg by mouth daily., Disp: , Rfl:  .  ondansetron (ZOFRAN) 4 MG tablet, Take 1 tablet (4 mg total) by mouth daily as needed for nausea or vomiting. (Patient taking differently: Take 8 mg by mouth daily as needed for nausea or vomiting. ), Disp: 30 tablet, Rfl: 0 .  oxyCODONE (ROXICODONE) 15 MG immediate release tablet, Take 15 mg by mouth 3 (three) times daily., Disp: , Rfl: 0 .  traZODone (DESYREL) 100 MG tablet, Take 1 tablet (100 mg total) by mouth at bedtime., Disp: 5 tablet, Rfl: 0 .  vitamin E 1000 UNIT capsule, Take 2,000 Units by mouth at bedtime. , Disp: , Rfl:  .  Zinc 50 MG TABS, Take 100 mg by mouth daily at 2 PM. , Disp: , Rfl:  .  clonazePAM (KLONOPIN) 0.5 MG tablet, Take 0.5 mg by mouth 2 (two) times daily., Disp: , Rfl:  .  fentaNYL (DURAGESIC - DOSED MCG/HR) 75 MCG/HR, Place 1 patch (75 mcg total) onto the skin every 3 (three) days. (Patient not taking: Reported on 02/22/2018), Disp: 5 patch, Rfl: 0 .  Magnesium Oxide -Mg Supplement 250 MG TABS, Take 500 mg by mouth daily at 2 PM. , Disp: , Rfl:  .  Melatonin 1 MG TABS, Take 1 tablet (1 mg total) by mouth at bedtime as needed. (Patient not taking: Reported on 02/22/2018), Disp: 5 tablet, Rfl: 0 .  protein supplement shake (PREMIER PROTEIN) LIQD, Take 11 oz by mouth daily with breakfast.  CARAMEL OR STRAWBERRY PREFERRED, Disp: , Rfl:   Current Facility-Administered Medications:  .  fentaNYL (SUBLIMAZE) injection 25-50 mcg, 25-50 mcg, Intravenous, Q5 min PRN, Milinda Pointer, MD, 50 mcg at 02/22/18 1441 .  lactated ringers infusion 1,000 mL, 1,000 mL, Intravenous, Once, Milinda Pointer, MD, Last Rate: 125 mL/hr at 02/22/18 1447, 1,000 mL at 02/22/18 1447 .  midazolam (VERSED) 5 MG/5ML injection 1-2 mg, 1-2 mg, Intravenous, Q5 min PRN, Milinda Pointer, MD, 2 mg at 02/22/18 1439  Imaging Review  Cervical Imaging: Cervical MR wo contrast:  Results for orders placed during the hospital encounter of 06/07/14  MR Cervical Spine Wo Contrast   Narrative CLINICAL DATA:  Neck pain.  EXAM: MRI CERVICAL SPINE WITHOUT CONTRAST  TECHNIQUE: Multiplanar, multisequence MR imaging of the cervical spine was performed. No intravenous contrast was administered.  COMPARISON:  None.  FINDINGS:  There are surgical changes noted at C5-6 with anterior plate and screws and interbody fusion. No complicating features are demonstrated. The alignment of the cervical vertebral bodies is normal. Normal marrow signal. The cervical spinal cord is normal. No cord lesions or syrinx. The facets are normally aligned. Mild to moderate facet disease.  C2-3: Shallow disc osteophyte complex on the right contributing to mild right foraminal stenosis. No spinal or left foraminal stenosis.  C3-4:  No significant findings.  C4-5: Bulging annulus and mild osteophytic ridging but no spinal or foraminal stenosis.  C5-6: Anterior and interbody fusion changes. No spinal or foraminal stenosis.  C6-7: Mild annular bulge with slight flattening of the ventral thecal sac but no significant spinal or foraminal stenosis.  C7-T1:  No significant findings.  IMPRESSION: Shallow disc osteophyte complex on the right at C2-3 with mild right foraminal stenosis.  Anterior and interbody fusion changes at C5-6.  No spinal or foraminal stenosis.   Electronically Signed   By: Kalman Jewels M.D.   On: 06/07/2014 14:23    Thoracic Imaging: Thoracic MR w/wo contrast:  Results for orders placed during the hospital encounter of 07/30/14  MR Thoracic Spine W Wo Contrast   Narrative CLINICAL DATA:  Thoracic back pain, many years duration. Slipped and fell in May of 2015. Numbness in both legs and feet.  BUN and creatinine were obtained on site at [place] at [address]Results: BUN 19 mg/dL, Creatinine 0.9 mg/dL.  EXAM: MRI THORACIC SPINE WITHOUT AND WITH CONTRAST  TECHNIQUE: Multiplanar and multiecho pulse sequences of the thoracic spine were obtained without and with intravenous contrast.  CONTRAST:  46m MULTIHANCE GADOBENATE DIMEGLUMINE 529 MG/ML IV SOLN  COMPARISON:  Radiography 12/28/2013.  CT chest 04/25/2014.  FINDINGS: No evidence of acute regional fracture. No spinal stenosis. Ample subarachnoid space surrounds the cord. No abnormal cord signal focally. One could question if there was mild generalized cord atrophy, cord diameter in the upper cervical region measuring 5 x 8 mm.  Minimal, non-compressive disc bulges noted at L1-2, L2-3 and L3-4.  L4-5 and L5-S1 show small central disc herniations that indent the ventral subarachnoid space but do not compress the cord or show foraminal extension.  Minimal noncompressive disc bulges at T6-7, T7-8, T8-9 and T9-10.  No disc pathology at T10-11. Minimal bulging of the disc at T11-12. No disc pathology T12-L1.  Mild lower thoracic facet and ligamentous hypertrophy without significant encroachment upon the canal or foramina.  No focal osseous lesion.  IMPRESSION: No evidence of fracture or acute/ traumatic pathology. No neural compressive pathology. No focal cord lesion. However, the cord in general appears atrophic with transverse diameter an the upper thoracic region only 5 x 8 mm. Normal measurement  would be more on the order of 1 x 1.3 cm.  Non-compressive disc bulges as outlined above. Small central protrusions at T4-5 and T5-6 but without any apparent effect upon the neural structures.   Electronically Signed   By: MNelson ChimesM.D.   On: 07/30/2014 15:33    Thoracic DG 2-3 views:  Results for orders placed during the hospital encounter of 06/04/16  DG Thoracic Spine 2 View   Narrative CLINICAL DATA:  67year old female with chronic mid back pain for several years radiating bilaterally. No known injury. Subsequent encounter.  EXAM: THORACIC SPINE 2 VIEWS  COMPARISON:  Thoracic spine MRI 07/30/2014.  FINDINGS: Partially visible lower cervical ACDF. Osteopenia. Normal thoracic segmentation. Cervicothoracic junction alignment is within normal limits. Thoracic vertebral height  and alignment appears stable since 2015. Chronic degenerative endplate spurring. Stable disc spaces. Grossly intact posterior ribs. Grossly negative visualized thoracic visceral contours.  IMPRESSION: 1.  No acute osseous abnormality identified in the thoracic spine. 2. If there is clinical suspicion of occult compression fracture Nuclear Medicine Whole-body Bone Scan or repeat Thoracic Spine MRI would best evaluate further.   Electronically Signed   By: Genevie Ann M.D.   On: 06/04/2016 17:22    Lumbosacral Imaging: Lumbar MR wo contrast:  Results for orders placed during the hospital encounter of 11/23/15  MR Lumbar Spine Wo Contrast   Narrative CLINICAL DATA:  Low back pain radiating down the posterior left leg. Right leg pain and bilateral leg weakness as well. Intermittent pain for multiple years. Prior laminectomy in 2014.  EXAM: MRI LUMBAR SPINE WITHOUT CONTRAST  TECHNIQUE: Multiplanar, multisequence MR imaging of the lumbar spine was performed. No intravenous contrast was administered.  COMPARISON:  06/07/2014  FINDINGS: Vertebral alignment is unchanged and near anatomic. A  chronic, moderate L3 compression fracture is unchanged. There are new mild L1 and moderate L5 compression fractures without evidence of significant edema, likely chronic. There is no retropulsion. L2 and L4 vertebral body heights are preserved. Minimal degenerative endplate edema is noted at L5-S1. The conus medullaris is normal in signal and terminates at the superior aspect of L1. Paraspinal soft tissues are unremarkable aside from postoperative changes and mild subcutaneous edema.  T11-12: Only imaged sagittally. Minimal disc bulging without evidence of significant stenosis.  T12-L1:  Minimal disc bulging without significant stenosis.  L1-2:  Minimal disc bulging without stenosis.  L2-3: Mild disc bulging and mild left greater than right facet and ligamentum flavum hypertrophy results in mild spinal stenosis with mild bilateral lateral recess narrowing, unchanged. No spinal stenosis.  L3-4: Prior laminectomies. Disc bulging and moderate facet hypertrophy result in minimal residual lateral recess narrowing bilaterally and minimal right and mild left neural foraminal stenosis, not significantly changed. No spinal stenosis.  L4-5: Prior laminectomies. Mild disc bulging and moderate facet hypertrophy result in minimal bilateral lateral recess narrowing without spinal canal or neural foraminal stenosis, unchanged.  L5-S1: Mild disc bulging and disc space height loss result in mild bilateral neural foraminal stenosis, not significantly changed. No spinal stenosis.  IMPRESSION: 1. L1 and L5 compression fractures, new from the prior MRI but chronic in appearance. 2. Remote L3 compression fracture. 3. Multilevel disc and facet degeneration with mild lateral recess and neural foraminal narrowing as above, not significantly changed.   Electronically Signed   By: Logan Bores M.D.   On: 11/24/2015 10:48    Lumbar MR w/wo contrast:  Results for orders placed during the hospital  encounter of 06/07/14  MR Lumbar Spine W Wo Contrast   Narrative CLINICAL DATA:  Low back pain and urinary incontinence.  EXAM: MRI LUMBAR SPINE WITHOUT AND WITH CONTRAST  TECHNIQUE: Multiplanar and multiecho pulse sequences of the lumbar spine were obtained without and with intravenous contrast.  CONTRAST:  31m MULTIHANCE GADOBENATE DIMEGLUMINE 529 MG/ML IV SOLN  COMPARISON:  11/28/2013  FINDINGS: Stable alignment of the lumbar vertebral bodies. No acute fracture. A remote L3 fracture is noted. The conus medullaris terminates at the top of L1. The facets are normally aligned. No pars defects.  Improved postoperative changes involving the posterior soft tissues.  L1-2:  No significant findings.  L2-3: Mild annular bulge with slight flattening of the ventral thecal sac and mild lateral recess encroachment. Moderate facet disease is noted. No  foraminal stenosis.  L3-4: Postoperative changes. There is advanced facet disease. A bulging annulus and osteophytic ridging contribute to flattening of the ventral thecal sac and mild bilateral lateral recess stenosis. No significant spinal or foraminal stenosis.  L4-5: Postoperative changes with decompressive laminectomy. There is a bulging degenerated annulus and osteophytic ridging but no significant spinal or foraminal stenosis. Advanced facet disease.  L5-S1: Bulging annulus and facet disease but no significant spinal or foraminal stenosis.  IMPRESSION: Degenerative lumbar spondylosis with multilevel disc disease and facet disease.  Mild lateral recess encroachment bilaterally at L2-3 but no significant spinal or foraminal stenosis.  Postoperative changes at L3-4 and L4-5 with mild bilateral lateral recess stenosis at L3-4. No significant spinal or foraminal stenosis.   Electronically Signed   By: Kalman Jewels M.D.   On: 06/07/2014 14:18    Hip Imaging: Hip-L MR wo contrast:  Results for orders placed during the  hospital encounter of 06/08/16  MR Hip Left Wo Contrast   Narrative CLINICAL DATA:  Bilateral hip and back pain. Hip pain is greater on the left side. History of arthritis and osteoporosis. Pain for 2 years.  EXAM: MR OF THE LEFT HIP WITHOUT CONTRAST  TECHNIQUE: Multiplanar, multisequence MR imaging was performed. No intravenous contrast was administered.  COMPARISON:  06/07/2014 lumbar spine MRI  FINDINGS: Despite efforts by the technologist and patient, motion artifact is present on today's exam and could not be eliminated. This reduces exam sensitivity and specificity.  Bones: Low-level edema in the ischial tuberosities at the hamstring attachment sites, left greater than right, likely reactive.  Degenerative subcortical cystic lesions along the anterior superior acetabular rim on the left.  Articular cartilage and labrum  Articular cartilage: Moderate degenerative chondral thinning in both hips.  Labrum: Motion artifact precludes sensitive assessment, especially on the small field diffuse sagittal images.  Joint or bursal effusion  Joint effusion: Upper normal amount of fluid in both hip joints, without overt effusion.  Bursae:  No regional bursitis.  Muscles and tendons  Muscles and tendons: Proximal hamstring tendinopathy bilaterally. Small amount of edema focally in the left gluteus maximus muscle laterally on image 22/5, could reflect a mild strain. Generalized regional muscular atrophy, symmetric.  Other findings  Miscellaneous:  No supplemental non-categorized findings.  IMPRESSION: 1. Moderate degenerative findings in both hips, without overt hip joint effusion or regional bursitis. 2. Proximal hamstring tendinopathy with some mild reactive findings in both ischial tuberosities. 3. Mild focal edema in the lateral portion of the left gluteus maximus muscle which could represent a mild muscle strain.   Electronically Signed   By: Van Clines M.D.   On: 06/08/2016 20:35    Hip-L DG 2-3 views:  Results for orders placed during the hospital encounter of 07/30/16  DG HIP UNILAT W OR W/O PELVIS 2-3 VIEWS LEFT   Narrative CLINICAL DATA:  Status post left hip arthroplasty, with pain  EXAM: DG HIP (WITH OR WITHOUT PELVIS) 2-3V LEFT  COMPARISON:  06/08/2016 hip MRI  FINDINGS: Interval left total hip arthroplasty, with no evidence of dislocation at the left hip and no evidence of hardware fracture or loosening. No acute osseous fracture. No suspicious focal osseous lesion. Expected soft tissue gas and skin staples lateral to the left hip.  IMPRESSION: Satisfactory immediate postoperative appearance status post interval left total hip arthroplasty.   Electronically Signed   By: Ilona Sorrel M.D.   On: 07/30/2016 10:49    Knee Imaging: Knee-R CT wo contrast:  Results for  orders placed during the hospital encounter of 02/04/15  CT Knee Right Wo Contrast   Narrative CLINICAL DATA:  [place] 1 year ago with tibial plateau fx. Hx of osteoporosis. Planning now for knee replacement-my knee  EXAM: CT OF THE RIGHT KNEE WITHOUT CONTRAST  TECHNIQUE: Multidetector CT imaging of the RIGHT knee was performed according to the standard protocol. Multiplanar CT image reconstructions were also generated.  COMPARISON:  None.  FINDINGS: The hip demonstrates no fracture or dislocation. There is no lytic or blastic lesion.  The right knee demonstrates no fracture or dislocation. There is no lytic or blastic lesion. 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 Secondary moderate osteoarthritis of the medial femorotibial compartment. Mild osteoarthritis of the lateral femorotibial compartment and patellofemoral compartment. Trace joint effusion.  The ankle demonstrates no fracture or dislocation. There is no lytic or blastic lesion.  Overall there is severe  osteopenia.  IMPRESSION: 1. 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   Electronically Signed   By: Kathreen Devoid   On: 02/04/2015 15:22    Knee-R DG 1-2 views:  Results for orders placed during the hospital encounter of 05/30/15  DG Knee 1-2 Views Right   Narrative CLINICAL DATA:  Postop right knee replacement  EXAM: RIGHT KNEE - 1-2 VIEW  COMPARISON:  01/14/2014  FINDINGS: Tricompartmental knee prosthesis with components in anticipated position. Anticipated postoperative change over the joint including air and fluid in the joint. Small joint effusion. Drainage catheter projects over the joint as well.  IMPRESSION: Anticipated postoperative appearance   Electronically Signed   By: Skipper Cliche M.D.   On: 05/30/2015 14:04    Knee-R DG 4 views:  Results for orders placed during the hospital encounter of 03/11/16  DG Knee Complete 4 Views Right   Narrative CLINICAL DATA:  Tripped going up steps yesterday with right knee pain.  EXAM: RIGHT KNEE - COMPLETE 4+ VIEW  COMPARISON:  None.  FINDINGS: Three views study shows no fracture. No subluxation or dislocation although positioning on the frontal projection and oblique projection is limited. No evidence for joint effusion. Bones are diffusely demineralized.  IMPRESSION: Negative.   Electronically Signed   By: Misty Stanley M.D.   On: 03/11/2016 19:27    Knee-L DG 4 views:  Results for orders placed during the hospital encounter of 06/20/16  DG Knee Complete 4 Views Left   Narrative CLINICAL DATA:  Pain after fall  EXAM: LEFT KNEE - COMPLETE 4+ VIEW  COMPARISON:  None.  FINDINGS: No evidence of fracture, dislocation, or joint effusion. No evidence of arthropathy or other focal bone abnormality. Soft tissues  are unremarkable.  IMPRESSION: Negative.   Electronically Signed   By: Dorise Bullion III M.D   On: 06/20/2016 17:09    Ankle Imaging: Ankle-R DG Complete:  Results for orders placed during the hospital encounter of 03/11/16  DG Ankle Complete Right   Narrative CLINICAL DATA:  Knee, ankle and foot pain after falling up stairs yesterday. History of osteoporosis and osteoarthritis.  EXAM: RIGHT ANKLE - COMPLETE 3+ VIEW  COMPARISON:  08/05/2011 radiographs.  FINDINGS: Progressive severe osseous demineralization. No evidence of acute fracture or dislocation. There are stable mild tibiotalar degenerative changes. The the soft tissue surrounding the ankle are diffusely prominent without apparent focal swelling.  IMPRESSION: Progressive osteopenia and generalized soft tissue prominence around  the ankle. No acute osseous findings.   Electronically Signed   By: Richardean Sale M.D.   On: 03/11/2016 19:29    Foot Imaging: Foot-R DG Complete:  Results for orders placed during the hospital encounter of 03/11/16  DG Foot Complete Right   Narrative CLINICAL DATA:  Knee, ankle and foot pain after falling up stairs yesterday. History of osteoporosis and osteoarthritis.  EXAM: RIGHT FOOT COMPLETE - 3+ VIEW  COMPARISON:  None.  FINDINGS: The bones are diffusely demineralized. No evidence of acute fracture or dislocation. The alignment is normal at the Lisfranc joint. There are mild first MTP degenerative changes. The soft tissues throughout the foot are prominent without focal swelling or foreign body.  IMPRESSION: Osteopenia and generalized soft tissue prominence. No acute osseous findings.   Electronically Signed   By: Richardean Sale M.D.   On: 03/11/2016 19:30    Complexity Note: Imaging results reviewed.                         ROS  Cardiovascular: High blood pressure Pulmonary or Respiratory: No reported pulmonary signs or symptoms such as wheezing and  difficulty taking a deep full breath (Asthma), difficulty blowing air out (Emphysema), coughing up mucus (Bronchitis), persistent dry cough, or temporary stoppage of breathing during sleep Neurological: No reported neurological signs or symptoms such as seizures, abnormal skin sensations, urinary and/or fecal incontinence, being born with an abnormal open spine and/or a tethered spinal cord Review of Past Neurological Studies:  Results for orders placed or performed during the hospital encounter of 01/17/18  CT HEAD WO CONTRAST   Narrative   CLINICAL DATA:  67 year old female with severe headache symptoms involving the skull and region of the left eye beginning about 2 months ago. Associated blurred vision and lightheadedness. Prior concussion in July 2018.  EXAM: CT HEAD WITHOUT CONTRAST  TECHNIQUE: Contiguous axial images were obtained from the base of the skull through the vertex without intravenous contrast.  COMPARISON:  Head CT without contrast 05/20/2017.  FINDINGS: Brain: No midline shift, ventriculomegaly, mass effect, evidence of mass lesion, intracranial hemorrhage or evidence of cortically based acute infarction. Stable gray-white matter differentiation throughout the brain. Mild for age patchy periventricular white matter hypodensity appears stable and is most apparent in the periatrial regions. No cortical encephalomalacia identified.  Vascular: Calcified atherosclerosis at the skull base. No suspicious intracranial vascular hyperdensity.  Skull: Stable.  No acute osseous abnormality identified.  Sinuses/Orbits: Visualized paranasal sinuses, tympanic cavities, and mastoids are stable and well pneumatized.  Other: Visualized orbit soft tissues are within normal limits. Visualized scalp soft tissues are within normal limits.  IMPRESSION: No acute intracranial abnormality.  Stable since 2018 and largely unremarkable for age noncontrast CT appearance of the  brain.   Electronically Signed   By: Genevie Ann M.D.   On: 01/17/2018 17:47    Psychological-Psychiatric: Psychiatric disorder, Anxiousness, Depressed, Prone to panicking and Difficulty sleeping and or falling asleep Gastrointestinal: Heartburn due to stomach pushing into lungs (Hiatal hernia) and Irregular, infrequent bowel movements (Constipation) Genitourinary: No reported renal or genitourinary signs or symptoms such as difficulty voiding or producing urine, peeing blood, non-functioning kidney, kidney stones, difficulty emptying the bladder, difficulty controlling the flow of urine, or chronic kidney disease Hematological: Weakness due to low blood hemoglobin or red blood cell count (Anemia) Endocrine: High blood sugar requiring insulin (IDDM) Rheumatologic: Joint aches and or swelling due to excess weight (Osteoarthritis) Musculoskeletal: Negative for myasthenia gravis,  muscular dystrophy, multiple sclerosis or malignant hyperthermia Work History: Retired and Disabled  Allergies  Ms. Bayon is allergic to bee venom; morphine and related; adhesive [tape]; celebrex [celecoxib]; tegaderm ag mesh [silver]; and grape seed.  Laboratory Chemistry  Inflammation Markers (CRP: Acute Phase) (ESR: Chronic Phase) Lab Results  Component Value Date   ESRSEDRATE 39 (H) 07/23/2016   LATICACIDVEN 1.3 04/03/2017                         Rheumatology Markers No results found.  Renal Function Markers Lab Results  Component Value Date   BUN 12 10/15/2017   CREATININE 0.70 10/15/2017   GFRAA >60 10/15/2017   GFRNONAA >60 10/15/2017                             Hepatic Function Markers Lab Results  Component Value Date   AST 18 10/15/2017   ALT 13 (L) 10/15/2017   ALBUMIN 3.5 10/15/2017   ALKPHOS 54 10/15/2017   LIPASE 22 10/15/2017                        Electrolytes Lab Results  Component Value Date   NA 134 (L) 10/15/2017   K 4.5 10/15/2017   CL 95 (L) 10/15/2017   CALCIUM 8.9  10/15/2017   PHOS 3.4 06/07/2012                        Neuropathy Markers Lab Results  Component Value Date   HGBA1C 6.0 (H) 04/01/2017   HIV Non Reactive 04/01/2017                        Bone Pathology Markers No results found.  Coagulation Parameters Lab Results  Component Value Date   INR 1.07 04/01/2017   LABPROT 13.9 04/01/2017   APTT 31 07/23/2016   PLT 251 10/15/2017                        Cardiovascular Markers Lab Results  Component Value Date   CKTOTAL 21 (L) 04/25/2014   CKMB < 0.5 (L) 04/25/2014   TROPONINI <0.03 04/01/2017   HGB 11.9 (L) 10/15/2017   HCT 36.1 10/15/2017                         CA Markers No results found.  Note: Lab results reviewed.  PFSH  Drug: Ms. Zell  reports that she does not use drugs. Alcohol:  reports that she does not drink alcohol. Tobacco:  reports that she has never smoked. She has never used smokeless tobacco. 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 Family: family history is not on file.  Past Surgical History:  Procedure Laterality Date  . CARDIAC CATHETERIZATION  2004  . CERVICAL LAMINOPLASTY    . CESAREAN SECTION     x2  . CHOLECYSTECTOMY    .  DILATION AND CURETTAGE OF UTERUS    . GASTRIC BYPASS  2004  . IVC FILTER PLACEMENT ([facility] HX)    . KNEE ARTHROSCOPY Left 2004 & 2006  . LAMINECTOMY  2014    lumbar 3-6 sacral 1-3  fusion  . NECK SURGERY      times 2  . PERIPHERAL VASCULAR CATHETERIZATION N/A 05/21/2015   Procedure: IVC Filter Insertion;  Surgeon: Katha Cabal, MD;  Location: [facility];  Service: Cardiovascular;  Laterality: N/A;  . PERIPHERAL VASCULAR CATHETERIZATION N/A 07/23/2015   Procedure: IVC Filter Removal;  Surgeon: Katha Cabal, MD;  Location: [facility];  Service: Cardiovascular;  Laterality: N/A;  . PERIPHERAL VASCULAR CATHETERIZATION N/A 07/21/2016   Procedure: IVC Filter Insertion;  Surgeon: Katha Cabal, MD;  Location: [facility];  Service: Cardiovascular;  Laterality: N/A;  . PERIPHERAL VASCULAR CATHETERIZATION N/A 10/16/2016   Procedure: IVC Filter Removal;  Surgeon: Katha Cabal, MD;  Location: [facility];  Service: Cardiovascular;  Laterality: N/A;  . TOTAL HIP ARTHROPLASTY Left 07/30/2016   Procedure: TOTAL HIP ARTHROPLASTY ANTERIOR APPROACH;  Surgeon: Hessie Knows, MD;  Location: [facility];  Service: Orthopedics;  Laterality: Left;  . TOTAL KNEE ARTHROPLASTY Right 05/30/2015   Procedure: TOTAL KNEE ARTHROPLASTY;  Surgeon: Hessie Knows, MD;  Location: [facility];  Service: Orthopedics;  Laterality: Right;   Active Ambulatory Problems    Diagnosis Date Noted  . Traumatic arthritis of knee 05/30/2015  . History of DVT (deep vein thrombosis) 07/16/2016  . Osteoarthritis of hip (Right) 06/18/2016  . Hypercoagulable state ([place]) 09/28/2016  . DJD (degenerative joint disease) 09/28/2016  . Metformin overdose 04/01/2017  . COPD (chronic obstructive pulmonary disease) ([place]) 04/01/2017  . Diabetes ([place]) 02/20/2013  . Bipolar disorder ([place]) 02/20/2013  . Acute right ankle pain 03/12/2016  . Age-related osteoporosis without current pathological fracture 09/22/2017  . Anxiety state 04/14/2016  . Back muscle spasm 10/08/2016  . Bipolar 1 disorder, mixed, moderate ([place]) 10/26/2014  . Cervical disc disease 02/20/2013   . Cervical neck pain with evidence of disc disease 04/24/2014  . Chronic bilateral thoracic back pain 04/14/2016  . Chronic low back pain 04/24/2014  . Chronic, continuous use of opioids 02/20/2013  . Encounter for long-term opiate analgesic use 12/15/2016  . Chronic hip pain (Right) 05/08/2016  . 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 Bilateral chronic knee pain 04/24/2014  . Abdominal wall bulge 11/10/2017  . Obesity, Class II, BMI 35-39.9 02/20/2013  . Lumbar radicular pain 05/31/2014  . Leg pain 03/30/2014  . Post-traumatic osteoarthritis of one knee 08/23/2014  . Psychophysiological insomnia 11/20/2016  . Pulmonary embolism on right ([place]) 02/15/2014  . Closed fracture of proximal tibia 02/22/2018  . Tibial plateau fracture 01/16/2014  . Type 2 diabetes mellitus without complication, without long-term current use of insulin ([place]) 11/30/2017  . Bipolar depression ([place]) 11/20/2016  . DDD (degenerative disc disease), cervical 02/22/2018  . Chronic anticoagulation ([place]) 02/22/2018  . History of hip replacement (Left) 02/22/2018  . Chronic pain syndrome 02/22/2018  . Pain medication agreement (w/ [facility]) 02/22/2018   Resolved Ambulatory Problems    Diagnosis Date Noted  . No Resolved Ambulatory Problems   Past Medical History:  Diagnosis Date  . Anxiety   . Arthritis   . Bipolar disorder ([place])   . COPD (chronic obstructive pulmonary disease) ([place])   . Coronary artery disease   . DDD (degenerative disc disease), cervical   .  Diabetes mellitus without complication ([place])   . Dyspnea   . Family history of adverse reaction to anesthesia   . Headache   . History of pulmonary embolus (PE)   . Lower extremity deep venous thrombosis ([facility])   . Lower extremity edema   . 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 Night terrors   . OCD (obsessive compulsive disorder)   .  Osteoarthritis   . Osteoporosis   . PTSD (post-traumatic stress disorder)    Constitutional Exam  General appearance: Well nourished, well developed, and well hydrated. In no apparent acute distress Vitals:   02/22/18 1434 02/22/18 1442 02/22/18 1452 02/22/18 1503  BP: (!) 142/83 106/61 (!) 113/51 (!) 96/54  Pulse:      Resp: 20 16 10 16   Temp:  97.8 F (36.6 C)  97.9 F (36.6 C)  TempSrc:  Temporal  Temporal  SpO2: 96% 93% 93% 95%  Weight:      Height:       BMI Assessment: Estimated body mass index is 36.19 kg/m as calculated from the following:   Height as of this encounter: 5' 8"  (1.727 m).   Weight as of this encounter: 238 lb (108 kg).  BMI interpretation table: BMI level Category Range association with higher incidence of chronic pain  <18 kg/m2 Underweight   18.5-24.9 kg/m2 Ideal body weight   25-29.9 kg/m2 Overweight Increased incidence by 20%  30-34.9 kg/m2 Obese (Class I) Increased incidence by 68%  35-39.9 kg/m2 Severe obesity (Class II) Increased incidence by 136%  >40 kg/m2 Extreme obesity (Class III) Increased incidence by 254%   Patient's current BMI Ideal Body weight  Body mass index is 36.19 kg/m. Ideal body weight: 63.9 kg (140 lb 14 oz) Adjusted ideal body weight: 81.5 kg (179 lb 11.6 oz)   BMI Readings from Last 4 Encounters:  02/22/18 36.19 kg/m  10/15/17 36.49 kg/m  05/20/17 36.49 kg/m  04/01/17 38.01 kg/m   Wt Readings from Last 4 Encounters:  02/22/18 238 lb (108 kg)  10/15/17 240 lb (108.9 kg)  05/20/17 240 lb (108.9 kg)  04/01/17 250 lb (113.4 kg)  Psych/Mental status: Alert, oriented x 3 (person, place, & time)       Eyes: PERLA Respiratory: No evidence of acute respiratory distress  Lumbar Spine Area Exam  Skin & Axial Inspection: No masses, redness, or swelling Alignment: Symmetrical Functional ROM: Decreased ROM       Stability: No instability detected Muscle Tone/Strength: Functionally intact. No obvious neuro-muscular  anomalies detected. Sensory (Neurological): Movement-associated discomfort Palpation: No palpable anomalies       Provocative Tests: Lumbar Hyperextension/rotation test: deferred today       Lumbar quadrant test (Kemp's test): deferred today       Lumbar Lateral bending test: deferred today       Patrick's Maneuver: (+) for right hip arthralgia             FABER test: deferred today       Thigh-thrust test: deferred today       S-I compression test: deferred today       S-I distraction test: deferred today        Gait & Posture Assessment  Ambulation: Patient came in today in a wheel chair Gait: Significantly limited. Dependent on assistive device to ambulate Posture: Antalgic   Lower Extremity Exam    Side: Right lower extremity  Side: Left lower extremity  Stability: No  instability observed          Stability: No instability observed          Skin & Extremity Inspection: Skin color, temperature, and hair growth are WNL. No peripheral edema or cyanosis. No masses, redness, swelling, asymmetry, or associated skin lesions. No contractures.  Skin & Extremity Inspection: Skin color, temperature, and hair growth are WNL. No peripheral edema or cyanosis. No masses, redness, swelling, asymmetry, or associated skin lesions. No contractures.  Functional ROM: Minimal ROM for hip joint          Functional ROM: Impaired ROM for the hip, do to her total hip replacement.          Muscle Tone/Strength: Guarding  Muscle Tone/Strength: Functionally intact. No obvious neuro-muscular anomalies detected.  Sensory (Neurological): Arthropathic arthralgia  Sensory (Neurological): Unimpaired  Palpation: Complains of area being tender to palpation  Palpation: No palpable anomalies   Pre-op Assessment:  Amy Sweeney is a 67 y.o. (year old), female patient, seen today for interventional treatment. She  has a past surgical history that includes Cesarean section; Dilation and curettage of uterus; Neck surgery;  Laminectomy (2014); Gastric bypass (2004); Knee arthroscopy (Left, 2004 & 2006); Cholecystectomy; Cardiac catheterization (N/A, 05/21/2015); Total knee arthroplasty (Right, 05/30/2015); Cardiac catheterization (N/A, 07/23/2015); Cardiac catheterization (N/A, 07/21/2016); IVC FILTER PLACEMENT ([place] HX); Cervical laminoplasty; Cardiac catheterization (2004); Total hip arthroplasty (Left, 07/30/2016); and Cardiac catheterization (N/A, 10/16/2016). Amy Sweeney has a current medication list which includes the following prescription(s): acetaminophen, apixaban, baclofen, calcium citrate, vitamin d3, vitamin b-12, denosumab, diphenhydramine, divalproex, duloxetine, ferrous sulfate, fluticasone, gabapentin, ginkgo biloba, metformin, metoprolol tartrate, multiple vitamins-minerals, nitroglycerin, olmesartan, ondansetron, oxycodone, trazodone, vitamin e, zinc, clonazepam, fentanyl, magnesium oxide -mg supplement, melatonin, and protein supplement shake, and the following Facility-Administered Medications: fentanyl, lactated ringers, and midazolam. Her primarily concern today is the Hip Pain (right)  Initial Vital Signs:  Pulse/HCG Rate: 72ECG Heart Rate: 68 Temp: 98 F (36.7 C) Resp: 16 BP: (!) 141/86 SpO2: 95 %  BMI: Estimated body mass index is 36.19 kg/m as calculated from the following:   Height as of this encounter: 5' 8"  (1.727 m).   Weight as of this encounter: 238 lb (108 kg).  Risk Assessment: Allergies: Reviewed. She is allergic to bee venom; morphine and related; adhesive [tape]; celebrex [celecoxib]; tegaderm ag mesh [silver]; and grape seed.  Allergy Precautions: None required Coagulopathies: Reviewed. None identified.  Blood-thinner therapy: None at this time Active Infection(s): Reviewed. None identified. Amy Sweeney is afebrile  Site Confirmation: Amy Sweeney was asked to confirm the procedure and laterality before marking the site Procedure checklist: Completed Consent: Before the  procedure and under the influence of no sedative(s), amnesic(s), or anxiolytics, the patient was informed of the treatment options, risks and possible complications. To fulfill our ethical and legal obligations, as recommended by the American Medical Association's Code of Ethics, I have informed the patient of my clinical impression; the nature and purpose of the treatment or procedure; the risks, benefits, and possible complications of the intervention; the alternatives, including doing nothing; the risk(s) and benefit(s) of the alternative treatment(s) or procedure(s); and the risk(s) and benefit(s) of doing nothing. The patient was provided information about the general risks and possible complications associated with the procedure. These may include, but are not limited to: failure to achieve desired goals, infection, bleeding, organ or nerve damage, allergic reactions, paralysis, and death. In addition, the patient was informed of those risks and complications associated to the procedure, such as failure  to decrease pain; infection; bleeding; organ or nerve damage with subsequent damage to sensory, motor, and/or autonomic systems, resulting in permanent pain, numbness, and/or weakness of one or several areas of the body; allergic reactions; (i.e.: anaphylactic reaction); and/or death. Furthermore, the patient was informed of those risks and complications associated with the medications. These include, but are not limited to: allergic reactions (i.e.: anaphylactic or anaphylactoid reaction(s)); adrenal axis suppression; blood sugar elevation that in diabetics may result in ketoacidosis or comma; water retention that in patients with history of congestive heart failure may result in shortness of breath, pulmonary edema, and decompensation with resultant heart failure; weight gain; swelling or edema; medication-induced neural toxicity; particulate matter embolism and blood vessel occlusion with resultant organ,  and/or nervous system infarction; and/or aseptic necrosis of one or more joints. Finally, the patient was informed that Medicine is not an exact science; therefore, there is also the possibility of unforeseen or unpredictable risks and/or possible complications that may result in a catastrophic outcome. The patient indicated having understood very clearly. We have given the patient no guarantees and we have made no promises. Enough time was given to the patient to ask questions, all of which were answered to the patient's satisfaction. Ms. Strauch has indicated that she wanted to continue with the procedure. Attestation: I, the ordering provider, attest that I have discussed with the patient the benefits, risks, side-effects, alternatives, likelihood of achieving goals, and potential problems during recovery for the procedure that I have provided informed consent. Date  Time: 02/22/2018 12:41 PM  Pre-Procedure Preparation:  Monitoring: As per clinic protocol. Respiration, ETCO2, SpO2, BP, heart rate and rhythm monitor placed and checked for adequate function Safety Precautions: Patient was assessed for positional comfort and pressure points before starting the procedure. Time-out: I initiated and conducted the "Time-out" before starting the procedure, as per protocol. The patient was asked to participate by confirming the accuracy of the "Time Out" information. Verification of the correct person, site, and procedure were performed and confirmed by me, the nursing staff, and the patient. "Time-out" conducted as per Joint Commission's Universal Protocol (UP.01.01.01). Time: 1424  Description of Procedure:       Safety Precautions: Aspiration looking for blood return was conducted prior to all injections. At no point did we inject any substances, as a needle was being advanced. No attempts were made at seeking any paresthesias. Safe injection practices and needle disposal techniques used. Medications  properly checked for expiration dates. SDV (single dose vial) medications used. Description of the Procedure: Protocol guidelines were followed. The patient was placed in position over the fluoroscopy table. The target area was identified and the area prepped in the usual manner. Skin & deeper tissues infiltrated with local anesthetic. Appropriate amount of time allowed to pass for local anesthetics to take effect. The procedure needles were then advanced to the target area. Proper needle placement secured. Negative aspiration confirmed. Solution injected in intermittent fashion, asking for systemic symptoms every 0.5cc of injectate. The needles were then removed and the area cleansed, making sure to leave some of the prepping solution back to take advantage of its long term bactericidal properties. Vitals:   02/22/18 1434 02/22/18 1442 02/22/18 1452 02/22/18 1503  BP: (!) 142/83 106/61 (!) 113/51 (!) 96/54  Pulse:      Resp: 20 16 10 16   Temp:  97.8 F (36.6 C)  97.9 F (36.6 C)  TempSrc:  Temporal  Temporal  SpO2: 96% 93% 93% 95%  Weight:  Height:        Start Time: 1424 hrs. End Time: 1430 hrs. Materials:  Needle(s) Type: Regular needle Gauge: 22G Length: 7-in Medication(s): Please see orders for medications and dosing details.  Imaging Guidance (Non-Spinal):  Type of Imaging Technique: Fluoroscopy Guidance (Non-Spinal) Indication(s): Assistance in needle guidance and placement for procedures requiring needle placement in or near specific anatomical locations not easily accessible without such assistance. Exposure Time: Please see nurses notes. Contrast: Before injecting any contrast, we confirmed that the patient did not have an allergy to iodine, shellfish, or radiological contrast. Once satisfactory needle placement was completed at the desired level, radiological contrast was injected. Contrast injected under live fluoroscopy. No contrast complications. See chart for type and  volume of contrast used. Fluoroscopic Guidance: I was personally present during the use of fluoroscopy. "Tunnel Vision Technique" used to obtain the best possible view of the target area. Parallax error corrected before commencing the procedure. "Direction-depth-direction" technique used to introduce the needle under continuous pulsed fluoroscopy. Once target was reached, antero-posterior, oblique, and lateral fluoroscopic projection used confirm needle placement in all planes. Images permanently stored in EMR. Interpretation: I personally interpreted the imaging intraoperatively. Adequate needle placement confirmed in multiple planes. Appropriate spread of contrast into desired area was observed. No evidence of afferent or efferent intravascular uptake. Permanent images saved into the patient's record. Contrast can be appreciated delineating the joint capsule both in the lateral and AP views.        Antibiotic Prophylaxis:   Anti-infectives (From admission, onward)   None     Indication(s): None identified  Post-operative Assessment:  Post-procedure Vital Signs:  Pulse/HCG Rate: 7271 Temp: 97.9 F (36.6 C) Resp: 16 BP: (!) 96/54 SpO2: 95 %  EBL: None  Complications: No immediate post-treatment complications observed by team, or reported by patient.  Note: The patient tolerated the entire procedure well. A repeat set of vitals were taken after the procedure and the patient was kept under observation following institutional policy, for this type of procedure. Post-procedural neurological assessment was performed, showing return to baseline, prior to discharge. The patient was provided with post-procedure discharge instructions, including a section on how to identify potential problems. Should any problems arise concerning this procedure, the patient was given instructions to immediately contact us, at any time, without hesitation. In any case, we plan to contact the patient by telephone  for a follow-up status report regarding this interventional procedure.  Comments:  No additional relevant information.  Plan of Care   Imaging Orders     DG C-Arm 1-60 Min-No Report  Procedure Orders     HIP INJECTION  Medications ordered for procedure: Meds ordered this encounter  Medications  . ropivacaine (PF) 2 mg/mL (0.2%) (NAROPIN) injection 4 mL  . methylPREDNISolone acetate (DEPO-MEDROL) injection 80 mg  . iopamidol (ISOVUE-M) 41 % intrathecal injection 10 mL  . lidocaine (XYLOCAINE) 2 % (with pres) injection 400 mg  . midazolam (VERSED) 5 MG/5ML injection 1-2 mg    Make sure Flumazenil is available in the pyxis when using this medication. If oversedation occurs, administer 0.2 mg IV over 15 sec. If after 45 sec no response, administer 0.2 mg again over 1 min; may repeat at 1 min intervals; not to exceed 4 doses (1 mg)  . fentaNYL (SUBLIMAZE) injection 25-50 mcg    Make sure Narcan is available in the pyxis when using this medication. In the event of respiratory depression (RR< 8/min): Titrate NARCAN (naloxone) in increments of 0.1 to  0.2 mg IV at 2-3 minute intervals, until desired degree of reversal.  . lactated ringers infusion 1,000 mL   Medications administered: We administered ropivacaine (PF) 2 mg/mL (0.2%), methylPREDNISolone acetate, iopamidol, lidocaine, midazolam, fentaNYL, and lactated ringers.  See the medical record for exact dosing, route, and time of administration.  New Prescriptions   No medications on file   Disposition: Discharge home  Discharge Date & Time: 02/22/2018; 1517 hrs.   Physician-requested Follow-up: Return for post-procedure eval (2 wks), w/ Dr. Dossie Arbour.  Future Appointments  Date Time Provider [place]  03/09/2018  8:15 AM Milinda Pointer, MD [facility] [place] None   Primary Care Physician: Tracie Harrier, MD Location: [facility] Note by: Gaspar Cola, MD Date: 02/22/2018; Time: 3:27  PM  Disclaimer:  Medicine is not an Chief Strategy Officer. The only guarantee in medicine is that nothing is guaranteed. It is important to note that the decision to proceed with this intervention was based on the information collected from the patient. The Data and conclusions were drawn from the patient's questionnaire, the interview, and the physical examination. Because the information was provided in large part by the patient, it cannot be guaranteed that it has not been purposely or unconsciously manipulated. Every effort has been made to obtain as much relevant data as possible for this evaluation. It is important to note that the conclusions that lead to this procedure are derived in large part from the available data. Always take into account that the treatment will also be dependent on availability of resources and existing treatment guidelines, considered by other Pain Management Practitioners as being common knowledge and practice, at the time of the intervention. For Medico-Legal purposes, it is also important to point out that variation in procedural techniques and pharmacological choices are the acceptable norm. The indications, contraindications, technique, and results of the above procedure should only be interpreted and judged by a Board-Certified Interventional Pain Specialist with extensive familiarity and expertise in the same exact procedure and technique.

## 2018-02-22 NOTE — Patient Instructions (Signed)

## 2018-02-23 ENCOUNTER — Telehealth: Payer: Self-pay

## 2018-02-23 NOTE — Telephone Encounter (Signed)
Post procedure phone call. Patient states she is doing good.  

## 2018-03-03 DIAGNOSIS — R079 Chest pain, unspecified: Secondary | ICD-10-CM | POA: Insufficient documentation

## 2018-03-03 DIAGNOSIS — I10 Essential (primary) hypertension: Secondary | ICD-10-CM | POA: Insufficient documentation

## 2018-03-09 ENCOUNTER — Ambulatory Visit: Payer: Medicare Other | Attending: Pain Medicine | Admitting: Pain Medicine

## 2018-03-09 ENCOUNTER — Other Ambulatory Visit: Payer: Self-pay

## 2018-03-09 ENCOUNTER — Encounter: Payer: Self-pay | Admitting: Pain Medicine

## 2018-03-09 ENCOUNTER — Ambulatory Visit: Payer: Medicare Other | Admitting: Pain Medicine

## 2018-03-09 VITALS — BP 148/99 | HR 72 | Temp 97.8°F | Resp 18 | Ht 68.5 in | Wt 248.0 lb

## 2018-03-09 DIAGNOSIS — G8929 Other chronic pain: Secondary | ICD-10-CM

## 2018-03-09 DIAGNOSIS — Z0289 Encounter for other administrative examinations: Secondary | ICD-10-CM | POA: Diagnosis not present

## 2018-03-09 DIAGNOSIS — E669 Obesity, unspecified: Secondary | ICD-10-CM | POA: Insufficient documentation

## 2018-03-09 DIAGNOSIS — Z885 Allergy status to narcotic agent status: Secondary | ICD-10-CM | POA: Diagnosis not present

## 2018-03-09 DIAGNOSIS — Z79891 Long term (current) use of opiate analgesic: Secondary | ICD-10-CM | POA: Insufficient documentation

## 2018-03-09 DIAGNOSIS — F319 Bipolar disorder, unspecified: Secondary | ICD-10-CM | POA: Insufficient documentation

## 2018-03-09 DIAGNOSIS — Z888 Allergy status to other drugs, medicaments and biological substances status: Secondary | ICD-10-CM | POA: Diagnosis not present

## 2018-03-09 DIAGNOSIS — J449 Chronic obstructive pulmonary disease, unspecified: Secondary | ICD-10-CM | POA: Insufficient documentation

## 2018-03-09 DIAGNOSIS — Z79899 Other long term (current) drug therapy: Secondary | ICD-10-CM | POA: Diagnosis not present

## 2018-03-09 DIAGNOSIS — M1611 Unilateral primary osteoarthritis, right hip: Secondary | ICD-10-CM | POA: Insufficient documentation

## 2018-03-09 DIAGNOSIS — I2510 Atherosclerotic heart disease of native coronary artery without angina pectoris: Secondary | ICD-10-CM | POA: Diagnosis not present

## 2018-03-09 DIAGNOSIS — Z9884 Bariatric surgery status: Secondary | ICD-10-CM | POA: Diagnosis not present

## 2018-03-09 DIAGNOSIS — Z886 Allergy status to analgesic agent status: Secondary | ICD-10-CM | POA: Insufficient documentation

## 2018-03-09 DIAGNOSIS — R079 Chest pain, unspecified: Secondary | ICD-10-CM | POA: Diagnosis not present

## 2018-03-09 DIAGNOSIS — M25551 Pain in right hip: Secondary | ICD-10-CM | POA: Diagnosis not present

## 2018-03-09 DIAGNOSIS — Z96642 Presence of left artificial hip joint: Secondary | ICD-10-CM | POA: Diagnosis not present

## 2018-03-09 DIAGNOSIS — G894 Chronic pain syndrome: Secondary | ICD-10-CM | POA: Diagnosis not present

## 2018-03-09 DIAGNOSIS — Z86718 Personal history of other venous thrombosis and embolism: Secondary | ICD-10-CM | POA: Insufficient documentation

## 2018-03-09 DIAGNOSIS — Z7982 Long term (current) use of aspirin: Secondary | ICD-10-CM | POA: Diagnosis not present

## 2018-03-09 DIAGNOSIS — F1190 Opioid use, unspecified, uncomplicated: Secondary | ICD-10-CM

## 2018-03-09 DIAGNOSIS — I10 Essential (primary) hypertension: Secondary | ICD-10-CM | POA: Insufficient documentation

## 2018-03-09 DIAGNOSIS — E119 Type 2 diabetes mellitus without complications: Secondary | ICD-10-CM | POA: Insufficient documentation

## 2018-03-09 DIAGNOSIS — Z7984 Long term (current) use of oral hypoglycemic drugs: Secondary | ICD-10-CM | POA: Insufficient documentation

## 2018-03-09 DIAGNOSIS — Z6837 Body mass index (BMI) 37.0-37.9, adult: Secondary | ICD-10-CM | POA: Insufficient documentation

## 2018-03-09 NOTE — Patient Instructions (Signed)

## 2018-03-09 NOTE — Progress Notes (Signed)
Patient's Name: Amy Sweeney  MRN: [id number]  Referring Provider: Tracie Harrier, MD  DOB: 04-26-1951  PCP: Tracie Harrier, MD  DOS: 03/09/2018  Note by: Gaspar Cola, MD  Service setting: Ambulatory outpatient  Specialty: Interventional Pain Management  Location: [facility] (AMB) Pain Management Facility    Patient type: Established ("FAST-TRACK")  postprocedure evaluation   Primary Reason(s) for Visit: Encounter for post-procedure evaluation of chronic illness with mild to moderate exacerbation CC: Hip Pain (right); Back Pain (low); and Knee Pain (left)  HPI  Ms. Ventola is a 67 y.o. year old, female patient, who comes today for a post-procedure evaluation. She has Traumatic arthritis of knee; History of DVT (deep vein thrombosis); Osteoarthritis of hip (Right); Hypercoagulable state ([place]); DJD (degenerative joint disease); Metformin overdose; COPD (chronic obstructive pulmonary disease) ([place]); Diabetes ([place]); Bipolar disorder ([place]); Acute right ankle pain; Age-related osteoporosis without current pathological fracture; Anxiety state; Back muscle spasm; Bipolar 1 disorder, mixed, moderate ([place]); Cervical disc disease; Cervical neck pain with evidence of disc disease; Chronic bilateral thoracic back pain; Chronic low back pain; Chronic, continuous use of opioids; Encounter for long-term opiate analgesic use; Chronic hip pain (Right); History of Roux-en-Y gastric bypass; Bilateral chronic knee pain; Abdominal wall bulge; Obesity, Class II, BMI 35-39.9; Lumbar radicular pain; Leg pain; Post-traumatic osteoarthritis of one knee; Psychophysiological insomnia; Pulmonary embolism on right ([place]); Closed fracture of proximal tibia; Tibial plateau fracture; Type 2 diabetes mellitus without complication, without long-term current use of insulin ([place]); Bipolar depression ([place]); DDD (degenerative disc disease), cervical; Chronic anticoagulation (Eliquis); History of hip replacement (Left); Chronic pain  syndrome; Pain medication agreement (w/ [facility]); Chest pain at rest; and Hypertension, essential on their problem list. Her primarily concern today is the Hip Pain (right); Back Pain (low); and Knee Pain (left)  Pain Assessment: Location: Right Hip Onset: More than a month ago Duration: Chronic pain Quality: Constant, Burning, Shooting(deep, consistent) Severity: 8 /10 (subjective, self-reported pain score)  Note: Reported level is inconsistent with clinical observations. Clinically the patient looks like a 3/10 A 3/10 is viewed as "Moderate" and described as significantly interfering with activities of daily living (ADL). It becomes difficult to feed, bathe, get dressed, get on and off the toilet or to perform personal hygiene functions. Difficult to get in and out of bed or a chair without assistance. Very distracting. With effort, it can be ignored when deeply involved in activities. Information on the proper use of the pain scale provided to the patient today. When using our objective Pain Scale, levels between 6 and 10/10 are said to belong in an emergency room, as it progressively worsens from a 6/10, described as severely limiting, requiring emergency care not usually available at an outpatient pain management facility. At a 6/10 level, communication becomes difficult and requires great effort. Assistance to reach the emergency department may be required. Facial flushing and profuse sweating along with potentially dangerous increases in heart rate and blood pressure will be evident. Timing: Constant Modifying factors: oxycodone BP: (!) 148/99  HR: 72  Ms. Aughenbaugh comes in today for post-procedure evaluation after the treatment done on 02/23/2018.   Time Note: Greater than 50% of the 40 minute(s) of face-to-face time spent with Ms. Puebla, was spent in counseling/coordination of care regarding: the appropriate use of the pain scale, Ms. Caldwell primary cause of pain, the results  of her recent test(s), realistic expectations, the importance of providing [place] with accurate post-procedure information and the need to collect and read the  AVS material.  Further details on both, my assessment(s), as well as the proposed treatment plan, please see below.  Post-Procedure Assessment  02/22/2018 Procedure: Diagnostic right-sided intra-articular hip joint injection #1 under fluoroscopic guidance and IV sedation Pre-procedure pain score:  7/10 Post-procedure pain score: 0/10 (100% relief) Influential Factors: BMI: 37.16 kg/m Intra-procedural challenges: None observed.         Assessment challenges: Results reported today are inconsistent with those reported on procedure day.        Before discharge, the patient had previously reported 100% relief of the pain. Reported side-effects: None.        Post-procedural adverse reactions or complications: None reported         Sedation: Sedation provided. When no sedatives are used, the analgesic levels obtained are directly associated to the effectiveness of the local anesthetics. However, when sedation is provided, the level of analgesia obtained during the initial 1 hour following the intervention, is believed to be the result of a combination of factors. These factors may include, but are not limited to: 1. The effectiveness of the local anesthetics used. 2. The effects of the analgesic(s) and/or anxiolytic(s) used. 3. The degree of discomfort experienced by the patient at the time of the procedure. 4. The patients ability and reliability in recalling and recording the events. 5. The presence and influence of possible secondary gains and/or psychosocial factors. Reported result: Relief experienced during the 1st hour after the procedure: 0 % (Ultra-Short Term Relief) Ms. Oesterling has indicated area to have been numb during this time. Interpretative annotation: Unexpected result. Analgesia during this period is likely to be Local  Anesthetic and/or IV Sedative (Analgesic/Anxiolytic) related. No relief from the local anesthetics would suggest pain etiology to reside elsewhere.  Effects of local anesthetic: The analgesic effects attained during this period are directly associated to the localized infiltration of local anesthetics and therefore cary significant diagnostic value as to the etiological location, or anatomical origin, of the pain. Expected duration of relief is directly dependent on the pharmacodynamics of the local anesthetic used. Long-acting (4-6 hours) anesthetics used.  Reported result: Relief during the next 4 to 6 hour after the procedure: 0 % (Short-Term Relief)            Interpretative annotation: Clinically possible results. Analgesia during this period is likely to be Local Anesthetic-related. No relief from the local anesthetics would suggest pain etiology to reside elsewhere.  Long-term benefit: Defined as the period of time past the expected duration of local anesthetics (1 hour for short-acting and 4-6 hours for long-acting). With the possible exception of prolonged sympathetic blockade from the local anesthetics, benefits during this period are typically attributed to, or associated with, other factors such as analgesic sensory neuropraxia, antiinflammatory effects, or beneficial biochemical changes provided by agents other than the local anesthetics.  Reported result: Extended relief following procedure: 10 % (Long-Term Relief)            Interpretative annotation: Clinically possible results. No benefit. Therapeutic failure. Pain appears to be refractory to this treatment modality. Etiology is likely mechanical rather than inflammatory.  Current benefits: Defined as reported results that persistent at this point in time.   Analgesia: 0 %            Function: No benefit ROM: No benefit Interpretative annotation: No benefit. Therapeutic failure. Results would argue against repeating therapy. Results of  this diagnostic injection would indicate that the pain does not originate in the hip.  Interpretation: Results  would suggest etiology of the pain to reside elsewhere. Results would suggest reassessment to be warranted.          Plan:  Based on these results, I would not recommend right hip replacement as treatment for her right hip pain. I took time to curve fully explained to the patient the importance of her reporting and she insists that it is 100% accurate. I also explained to her how this would suggest that the pain is not coming from her hip and that therefore would not recommend hip replacement. She understood and continued to support her documented results. I made sure to take the time to carefully explained to her what my report would be consisting of so that she would not get any surprises. I also dictated time to carefully explained the reason behind my recommendations, all of which she understood clearly. Once again I asked her if she was sure that the reported that she was giving me that was accurate and she insisted again that it was I also confronted her about the discrepancy between the day of the procedure where she indicated having had 100% relief of the pain and her subsequent reporting where she indicated 0% relief. Again she continues to confirm that her report is 100% accurate. Based on the patient is reporting of the events, the diagnostic intra-articular hip injection confirms that her hip is not the cause of this pain. It is for this reason that I would not recommend a hip replacement for the treatment of her pain.  Laboratory Chemistry  Inflammation Markers (CRP: Acute Phase) (ESR: Chronic Phase) Lab Results  Component Value Date   ESRSEDRATE 39 (H) 07/23/2016   LATICACIDVEN 1.3 04/03/2017                         Renal Markers Lab Results  Component Value Date   BUN 12 10/15/2017   CREATININE 0.70 10/15/2017   GFRAA >60 10/15/2017   GFRNONAA >60 10/15/2017                              Hepatic Markers Lab Results  Component Value Date   AST 18 10/15/2017   ALT 13 (L) 10/15/2017   ALBUMIN 3.5 10/15/2017                        Neuropathy Markers Lab Results  Component Value Date   HGBA1C 6.0 (H) 04/01/2017   HIV Non Reactive 04/01/2017                        Hematology Parameters Lab Results  Component Value Date   INR 1.07 04/01/2017   LABPROT 13.9 04/01/2017   APTT 31 07/23/2016   PLT 251 10/15/2017   HGB 11.9 (L) 10/15/2017   HCT 36.1 10/15/2017                        CV Markers Lab Results  Component Value Date   CKTOTAL 21 (L) 04/25/2014   CKMB < 0.5 (L) 04/25/2014   TROPONINI <0.03 04/01/2017                         Note: Lab results reviewed.  Recent Diagnostic Imaging Results  DG C-Arm 1-60 Min-No Report Fluoroscopy was utilized by the requesting physician.  No radiographic  interpretation.          The above x-rays confirm the contrast to be delineating the hip capsule and therefore injection to be intra-articular as we had planned.  Complexity Note: I personally reviewed the fluoroscopic imaging of the procedure.                        Meds   Current Outpatient Medications:  .  acetaminophen (TYLENOL) 500 MG tablet, Take 1,500 mg by mouth 3 (three) times daily. With Gabapentin, Disp: , Rfl:  .  aspirin 325 MG tablet, Take 325 mg by mouth daily., Disp: , Rfl:  .  baclofen (LIORESAL) 10 MG tablet, Take 5 mg by mouth 2 (two) times daily. , Disp: , Rfl:  .  CALCIUM CITRATE PO, Take 2,400 mg by mouth daily at 2 PM. , Disp: , Rfl:  .  carvedilol (COREG) 12.5 MG tablet, Take 12.5 mg by mouth 2 (two) times daily with a meal., Disp: , Rfl:  .  Cholecalciferol (VITAMIN D3) 5000 UNITS CAPS, Take 25,000 Units by mouth daily at 2 PM. , Disp: , Rfl:  .  clonazePAM (KLONOPIN) 0.5 MG tablet, Take 0.5 mg by mouth 2 (two) times daily., Disp: , Rfl:  .  Cyanocobalamin (VITAMIN B-12) 2500 MCG SUBL, Place 5,000 mcg under the tongue  daily at 2 PM. , Disp: , Rfl:  .  denosumab (PROLIA) 60 MG/ML SOLN injection, Inject 60 mg into the skin every 6 (six) months. Administer in upper arm, thigh, or abdomen, Disp: , Rfl:  .  diphenhydrAMINE (SOMINEX) 25 MG tablet, Take 50 mg by mouth at bedtime. 50 mg at bedtime, if patient is manic due to BIPOLAR 1 she will take 100 mg at bedtime., Disp: , Rfl:  .  divalproex (DEPAKOTE ER) 500 MG 24 hr tablet, Take 1,500 mg by mouth daily. , Disp: , Rfl:  .  DULoxetine (CYMBALTA) 60 MG capsule, Take 120 mg by mouth daily. , Disp: , Rfl:  .  fentaNYL (DURAGESIC - DOSED MCG/HR) 75 MCG/HR, Place 1 patch (75 mcg total) onto the skin every 3 (three) days., Disp: 5 patch, Rfl: 0 .  ferrous sulfate 325 (65 FE) MG tablet, Take 650 mg by mouth daily at 2 PM. , Disp: , Rfl:  .  fluticasone (FLONASE) 50 MCG/ACT nasal spray, Place 1 spray into both nostrils 2 (two) times daily. , Disp: , Rfl:  .  gabapentin (NEURONTIN) 400 MG capsule, Take 1,200 mg by mouth 3 (three) times daily. With Tylenol 1500 mg, Disp: , Rfl:  .  Ginkgo Biloba 60 MG CAPS, Take 120 mg by mouth daily at 2 PM. , Disp: , Rfl:  .  Magnesium Oxide -Mg Supplement 250 MG TABS, Take 500 mg by mouth daily at 2 PM. , Disp: , Rfl:  .  Melatonin 1 MG TABS, Take 1 tablet (1 mg total) by mouth at bedtime as needed., Disp: 5 tablet, Rfl: 0 .  metFORMIN (GLUCOPHAGE) 1000 MG tablet, Take 2,000 mg by mouth daily with breakfast. , Disp: , Rfl:  .  Multiple Vitamins-Minerals (ONE-A-DAY WOMENS 50 PLUS PO), Take 2 tablets by mouth daily at 2 PM. , Disp: , Rfl:  .  nitroGLYCERIN (NITROSTAT) 0.4 MG SL tablet, Place 0.4 mg under the tongue every 5 (five) minutes as needed. For chest pain., Disp: , Rfl:  .  ondansetron (ZOFRAN) 4 MG tablet, Take 1 tablet (4 mg total) by mouth daily as needed for nausea  or vomiting. (Patient taking differently: Take 8 mg by mouth daily as needed for nausea or vomiting. ), Disp: 30 tablet, Rfl: 0 .  oxyCODONE (ROXICODONE) 15 MG immediate  release tablet, Take 15 mg by mouth 3 (three) times daily., Disp: , Rfl: 0 .  protein supplement shake (PREMIER PROTEIN) LIQD, Take 11 oz by mouth daily with breakfast. CARAMEL OR STRAWBERRY PREFERRED, Disp: , Rfl:  .  traZODone (DESYREL) 100 MG tablet, Take 1 tablet (100 mg total) by mouth at bedtime., Disp: 5 tablet, Rfl: 0 .  vitamin E 1000 UNIT capsule, Take 2,000 Units by mouth at bedtime. , Disp: , Rfl:  .  Zinc 50 MG TABS, Take 100 mg by mouth daily at 2 PM. , Disp: , Rfl:   ROS  Constitutional: Denies any fever or chills Gastrointestinal: No reported hemesis, hematochezia, vomiting, or acute GI distress Musculoskeletal: Denies any acute onset joint swelling, redness, loss of ROM, or weakness Neurological: No reported episodes of acute onset apraxia, aphasia, dysarthria, agnosia, amnesia, paralysis, loss of coordination, or loss of consciousness  Allergies  Ms. Level is allergic to bee venom; morphine and related; adhesive [tape]; celebrex [celecoxib]; tegaderm ag mesh [silver]; and grape seed.  PFSH  Drug: Ms. Dwiggins  reports that she does not use drugs. Alcohol:  reports that she does not drink alcohol. Tobacco:  reports that she has never smoked. She has never used smokeless tobacco. 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 Surgical: Ms. Chrismer  has a past surgical  history that includes Cesarean section; Dilation and curettage of uterus; Neck surgery; Laminectomy (2014); Gastric bypass (2004); Knee arthroscopy (Left, 2004 & 2006); Cholecystectomy; Cardiac catheterization (N/A, 05/21/2015); Total knee arthroplasty (Right, 05/30/2015); Cardiac catheterization (N/A, 07/23/2015); Cardiac catheterization (N/A, 07/21/2016); IVC FILTER PLACEMENT ([place] [facility]); Cervical laminoplasty; Cardiac catheterization (2004); Total hip arthroplasty (Left, 07/30/2016); and Cardiac catheterization (N/A, 10/16/2016). Family: family history is not on file.  Constitutional Exam  General appearance: cooperative, somnolent, oriented, in no distress, morbidly obese, well nourished and well hydrated Vitals:   03/09/18 1318  BP: (!) 148/99  Pulse: 72  Resp: 18  Temp: 97.8 F (36.6 C)  TempSrc: Oral  SpO2: 95%  Weight: 248 lb (112.5 kg)  Height: 5' 8.5" (1.74 m)   BMI Assessment: Estimated body mass index is 37.16 kg/m as calculated from the following:   Height as of this encounter: 5' 8.5" (1.74 m).   Weight as of this encounter: 248 lb (112.5 kg).  BMI interpretation table: BMI level Category Range association with higher incidence of chronic pain  <18 kg/m2 Underweight   18.5-24.9 kg/m2 Ideal body weight   25-29.9 kg/m2 Overweight Increased incidence by 20%  30-34.9 kg/m2 Obese (Class I) Increased incidence by 68%  35-39.9 kg/m2 Severe obesity (Class II) Increased incidence by 136%  >40 kg/m2 Extreme obesity (Class III) Increased incidence by 254%   Patient's current BMI Ideal Body weight  Body mass index is 37.16 kg/m. Ideal body weight: 65 kg (143 lb 6.5 oz) Adjusted ideal body weight: 84 kg (185 lb 3.9 oz)  BMI Readings from Last 4 Encounters:  03/09/18 37.16 kg/m  02/22/18 36.19 kg/m  10/15/17 36.49 kg/m  05/20/17 36.49 kg/m   Wt Readings from Last 4 Encounters:  03/09/18 248 lb (112.5 kg)  02/22/18 238 lb (108 kg)  10/15/17 240 lb (108.9 kg)  05/20/17 240 lb  (108.9 kg)  Psych/Mental status: Alert, oriented x 3 (person, place, & time) Ms. Doffing speech pattern and demeanor seems to suggest oversedation. The patient fell asleep several times while I was carrying a conversation with her.  Eyes: PERLA Respiratory: No evidence of acute respiratory distress  Lumbar Spine Area Exam  Skin & Axial Inspection: No masses, redness, or swelling Alignment: Symmetrical Functional ROM: Unrestricted ROM       Stability: No instability detected Muscle Tone/Strength: Functionally intact. No obvious neuro-muscular anomalies detected. Sensory (Neurological): Unimpaired Palpation: No palpable anomalies       Provocative Tests: Lumbar Hyperextension/rotation test: deferred today       Lumbar quadrant test (Kemp's test): deferred today       Lumbar Lateral bending test: deferred today       Patrick's Maneuver: deferred today                   FABER test: deferred today       Thigh-thrust test: deferred today       S-I compression test: deferred today       S-I distraction test: deferred today        Gait & Posture Assessment  Ambulation: Patient came in today in a wheel chair Gait: Significantly limited. Dependent on assistive device to ambulate Posture: Slouching   Lower Extremity Exam    Side: Right lower extremity  Side: Left lower extremity  Stability: No instability observed          Stability: No instability observed          Skin & Extremity Inspection: Skin color, temperature, and hair growth are WNL. No peripheral edema or cyanosis. No masses, redness, swelling, asymmetry, or associated skin lesions. No contractures.  Skin & Extremity Inspection: Skin color, temperature, and hair growth are WNL. No peripheral edema or cyanosis. No masses, redness, swelling, asymmetry, or associated skin lesions. No contractures.  Functional ROM: Unrestricted ROM                  Functional ROM: Unrestricted ROM                  Muscle Tone/Strength: Functionally  intact. No obvious neuro-muscular anomalies detected.  Muscle Tone/Strength: Functionally intact. No obvious neuro-muscular anomalies detected.  Sensory (Neurological): Unimpaired  Sensory (Neurological): Unimpaired  Palpation: No palpable anomalies  Palpation: No palpable anomalies   Assessment  Primary Diagnosis & Pertinent Problem List: The primary encounter diagnosis was Chronic hip pain (Right). Diagnoses of Osteoarthritis of hip (Right), Chronic, continuous use of opioids, Encounter for long-term opiate analgesic use, and Pain medication agreement (w/ [facility]) were also pertinent to this visit.  Status Diagnosis  Persistent Stable Stable 1. Chronic hip pain (Right)   2. Osteoarthritis of hip (Right)   3. Chronic, continuous use of opioids   4. Encounter for long-term opiate analgesic use   5. Pain medication agreement (w/ [facility])     Problems updated and reviewed during this visit:  Problem  Chest Pain At Rest  Hypertension, Essential   Plan of Care  Pharmacotherapy (Medications Ordered): No orders of the defined types were placed in this encounter.  Medications administered today: Najmah Carradine. [place] had no medications administered during this visit.  Procedure Orders    No procedure(s) ordered today   Lab Orders  No laboratory test(s) ordered today   Imaging Orders  No imaging studies ordered today   Referral Orders  No referral(s) requested today   Interventional management options: Planned, scheduled, and/or pending:   None. The patient was referred to [place] for a "Fast Track" intra-articular hip joint injection which we have completed. No further treatment planned.    Provider-requested follow-up: No follow-ups on file.  No future appointments. Primary Care Physician: Tracie Harrier, MD Location: [facility] Note by: Gaspar Cola, MD Date: 03/09/2018; Time: 3:09 PM

## 2018-03-09 NOTE — Progress Notes (Signed)
Safety precautions to be maintained throughout the outpatient stay will include: orient to surroundings, keep bed in low position, maintain call bell within reach at all times, provide assistance with transfer out of bed and ambulation.  

## 2018-03-31 ENCOUNTER — Encounter: Payer: Self-pay | Admitting: *Deleted

## 2018-04-01 ENCOUNTER — Ambulatory Visit: Payer: Medicare Other | Admitting: Anesthesiology

## 2018-04-01 ENCOUNTER — Ambulatory Visit
Admission: RE | Admit: 2018-04-01 | Discharge: 2018-04-01 | Disposition: A | Discharge status: Home / Self Care | Payer: Medicare Other | Source: Ambulatory Visit | Attending: Gastroenterology | Admitting: Gastroenterology

## 2018-04-01 ENCOUNTER — Encounter
Admission: RE | Disposition: A | Discharge status: Home / Self Care | Payer: Self-pay | Source: Ambulatory Visit | Attending: Gastroenterology

## 2018-04-01 DIAGNOSIS — Z9884 Bariatric surgery status: Secondary | ICD-10-CM | POA: Insufficient documentation

## 2018-04-01 DIAGNOSIS — I10 Essential (primary) hypertension: Secondary | ICD-10-CM | POA: Diagnosis not present

## 2018-04-01 DIAGNOSIS — J449 Chronic obstructive pulmonary disease, unspecified: Secondary | ICD-10-CM | POA: Insufficient documentation

## 2018-04-01 DIAGNOSIS — Z7901 Long term (current) use of anticoagulants: Secondary | ICD-10-CM | POA: Diagnosis not present

## 2018-04-01 DIAGNOSIS — F319 Bipolar disorder, unspecified: Secondary | ICD-10-CM | POA: Insufficient documentation

## 2018-04-01 DIAGNOSIS — K3189 Other diseases of stomach and duodenum: Secondary | ICD-10-CM | POA: Diagnosis not present

## 2018-04-01 DIAGNOSIS — Z7984 Long term (current) use of oral hypoglycemic drugs: Secondary | ICD-10-CM | POA: Insufficient documentation

## 2018-04-01 DIAGNOSIS — E119 Type 2 diabetes mellitus without complications: Secondary | ICD-10-CM | POA: Insufficient documentation

## 2018-04-01 DIAGNOSIS — E538 Deficiency of other specified B group vitamins: Secondary | ICD-10-CM | POA: Diagnosis not present

## 2018-04-01 DIAGNOSIS — I2510 Atherosclerotic heart disease of native coronary artery without angina pectoris: Secondary | ICD-10-CM | POA: Insufficient documentation

## 2018-04-01 DIAGNOSIS — M810 Age-related osteoporosis without current pathological fracture: Secondary | ICD-10-CM | POA: Insufficient documentation

## 2018-04-01 DIAGNOSIS — Z79891 Long term (current) use of opiate analgesic: Secondary | ICD-10-CM | POA: Diagnosis not present

## 2018-04-01 DIAGNOSIS — K224 Dyskinesia of esophagus: Secondary | ICD-10-CM | POA: Insufficient documentation

## 2018-04-01 DIAGNOSIS — Z1211 Encounter for screening for malignant neoplasm of colon: Secondary | ICD-10-CM | POA: Insufficient documentation

## 2018-04-01 DIAGNOSIS — Z86718 Personal history of other venous thrombosis and embolism: Secondary | ICD-10-CM | POA: Insufficient documentation

## 2018-04-01 DIAGNOSIS — K5730 Diverticulosis of large intestine without perforation or abscess without bleeding: Secondary | ICD-10-CM | POA: Diagnosis not present

## 2018-04-01 DIAGNOSIS — Z86711 Personal history of pulmonary embolism: Secondary | ICD-10-CM | POA: Insufficient documentation

## 2018-04-01 DIAGNOSIS — Z79899 Other long term (current) drug therapy: Secondary | ICD-10-CM | POA: Diagnosis not present

## 2018-04-01 DIAGNOSIS — Q438 Other specified congenital malformations of intestine: Secondary | ICD-10-CM | POA: Diagnosis not present

## 2018-04-01 DIAGNOSIS — Z7951 Long term (current) use of inhaled steroids: Secondary | ICD-10-CM | POA: Diagnosis not present

## 2018-04-01 DIAGNOSIS — F4310 Post-traumatic stress disorder, unspecified: Secondary | ICD-10-CM | POA: Diagnosis not present

## 2018-04-01 DIAGNOSIS — F428 Other obsessive-compulsive disorder: Secondary | ICD-10-CM | POA: Diagnosis not present

## 2018-04-01 HISTORY — DX: Essential (primary) hypertension: I10

## 2018-04-01 HISTORY — DX: Major depressive disorder, single episode, unspecified: F32.9

## 2018-04-01 HISTORY — DX: Depression, unspecified: F32.A

## 2018-04-01 HISTORY — PX: COLONOSCOPY WITH PROPOFOL: SHX5780

## 2018-04-01 HISTORY — DX: Personal history of other infectious and parasitic diseases: Z86.19

## 2018-04-01 HISTORY — DX: Deficiency of other specified B group vitamins: E53.8

## 2018-04-01 HISTORY — PX: ESOPHAGOGASTRODUODENOSCOPY (EGD) WITH PROPOFOL: SHX5813

## 2018-04-01 HISTORY — DX: Bipolar disorder, unspecified: F31.9

## 2018-04-01 LAB — GLUCOSE, CAPILLARY: GLUCOSE-CAPILLARY: 114 mg/dL — AB (ref 70–99)

## 2018-04-01 SURGERY — COLONOSCOPY WITH PROPOFOL
Anesthesia: General

## 2018-04-01 MED ORDER — LIDOCAINE 2% (20 MG/ML) 5 ML SYRINGE
INTRAMUSCULAR | Status: DC | PRN
  Administered 2018-04-01: 40 mg via INTRAVENOUS

## 2018-04-01 MED ORDER — PROPOFOL 500 MG/50ML IV EMUL
INTRAVENOUS | Status: AC
  Filled 2018-04-01: qty 50

## 2018-04-01 MED ORDER — PROPOFOL 10 MG/ML IV BOLUS
INTRAVENOUS | Status: AC
  Filled 2018-04-01: qty 20

## 2018-04-01 MED ORDER — LIDOCAINE HCL (PF) 1 % IJ SOLN
INTRAMUSCULAR | Status: AC
  Administered 2018-04-01: 0.3 mL via INTRADERMAL
  Filled 2018-04-01: qty 2

## 2018-04-01 MED ORDER — IPRATROPIUM-ALBUTEROL 0.5-2.5 (3) MG/3ML IN SOLN: 3.0000 mL | Freq: Once | RESPIRATORY_TRACT | Status: DC

## 2018-04-01 MED ORDER — FENTANYL CITRATE (PF) 100 MCG/2ML IJ SOLN
INTRAMUSCULAR | Status: DC | PRN
  Administered 2018-04-01 (×2): 50 ug via INTRAVENOUS

## 2018-04-01 MED ORDER — FENTANYL CITRATE (PF) 100 MCG/2ML IJ SOLN
INTRAMUSCULAR | Status: AC
  Filled 2018-04-01: qty 2

## 2018-04-01 MED ORDER — LIDOCAINE HCL (PF) 1 % IJ SOLN
2.0000 mL | Freq: Once | INTRAMUSCULAR | Status: AC
  Administered 2018-04-01: 0.3 mL via INTRADERMAL

## 2018-04-01 MED ORDER — SODIUM CHLORIDE 0.9 % IV SOLN
INTRAVENOUS | Status: DC
  Administered 2018-04-01 (×2): via INTRAVENOUS

## 2018-04-01 MED ORDER — LIDOCAINE HCL (PF) 2 % IJ SOLN
INTRAMUSCULAR | Status: AC
  Filled 2018-04-01: qty 10

## 2018-04-01 MED ORDER — PROPOFOL 500 MG/50ML IV EMUL
INTRAVENOUS | Status: DC | PRN
  Administered 2018-04-01: 150 ug/kg/min via INTRAVENOUS

## 2018-04-01 MED ORDER — PROPOFOL 10 MG/ML IV BOLUS
INTRAVENOUS | Status: DC | PRN
  Administered 2018-04-01: 100 mg via INTRAVENOUS

## 2018-04-01 NOTE — Op Note (Addendum)
[facility] Gastroenterology Patient Name: Amy Sweeney Procedure Date: 04/01/2018 10:40 AM MRN: [id number] Account #: [id number] Date of Birth: Feb 02, 1951 Admit Type: Outpatient Age: 67 Room: [facility] ENDO ROOM 1 Gender: Female Note Status: Finalized Procedure:            Upper GI endoscopy Providers:            Christena Deem, MD Referring MD:         Barbette Reichmann, MD (Referring MD) Medicines:            Monitored Anesthesia Care Complications:        No immediate complications. Procedure:            Pre-Anesthesia Assessment:                       - ASA Grade Assessment: III - A patient with severe                        systemic disease.                       After obtaining informed consent, the endoscope was                        passed under direct vision. Throughout the procedure,                        the patient's blood pressure, pulse, and oxygen                        saturations were monitored continuously. The Endoscope                        was introduced through the mouth, and advanced to the                        afferent and efferent jejunal loops. The upper GI                        endoscopy was accomplished without difficulty. The                        patient tolerated the procedure well. Findings:      Abnormal motility was noted in the mid esophagus and in the distal       esophagus. The cricopharyngeus was normal. There are extra peristaltic       waves in the esophageal body. Tertiary peristaltic waves are noted.      The Z-line was regular.      Evidence of a Roux-en-Y gastrojejunostomy was found. The gastrojejunal       anastomosis was characterized by minimal erythema, biopsies times one.       The gastric pouch was aproximately 4 cm in size. This was traversed. The       efferent limb was normal and healthy in appearance. The afferent was       also noted to be normal and healthy in appearance. Biopsies were taken    with a cold forceps for histology. Impression:           - Abnormal esophageal motility.                       -  Z-line regular.                       - Roux-en-Y gastrojejunostomy with gastrojejunal                        anastomosis characterized by minimal erythema. Biopsied. Recommendation:       - Consider use of sucralfate suspension 1 gram PO QID                        if recurrent symptoms.                       - Return to GI clinic in 5 weeks.                       - consider UGIS for anatomic evaluation Procedure Code(s):    --- Professional ---                       [phone], Esophagogastroduodenoscopy, flexible, transoral;                        with biopsy, single or multiple CPT copyright 2017 American Medical Association. All rights reserved. The codes documented in this report are preliminary and upon coder review may  be revised to meet current compliance requirements. Christena DeemMartin U Talvin Christianson, MD 04/01/2018 11:10:08 AM This report has been signed electronically. Number of Addenda: 0 Note Initiated On: 04/01/2018 10:40 AM      [facility]

## 2018-04-01 NOTE — Anesthesia Preprocedure Evaluation (Addendum)
Anesthesia Evaluation  Patient identified by MRN, date of birth, ID band Patient awake    Reviewed: Allergy & Precautions, H&P , NPO status , Patient's Chart, lab work & pertinent test results  History of Anesthesia Complications (+) Family history of anesthesia reaction and history of anesthetic complications  Airway Mallampati: III  TM Distance: >3 FB Neck ROM: limited    Dental  (+) Chipped, Poor Dentition, Missing, Upper Dentures   Pulmonary neg shortness of breath, COPD,           Cardiovascular hypertension, (-) angina+ CAD and + Peripheral Vascular Disease  (-) Past MI and (-) DOE      Neuro/Psych  Headaches, PSYCHIATRIC DISORDERS Anxiety Depression Bipolar Disorder  Neuromuscular disease    GI/Hepatic negative GI ROS, Neg liver ROS, neg GERD  ,  Endo/Other  diabetes, Type 2  Renal/GU negative Renal ROS  negative genitourinary   Musculoskeletal  (+) Arthritis ,   Abdominal   Peds  Hematology negative hematology ROS (+)   Anesthesia Other Findings Past Medical History: No date: Anxiety No date: Arthritis     Comment:  degenerative artgritis  osteoarthritis No date: Bipolar 1 disorder (HCC) No date: Bipolar disorder (HCC) No date: COPD (chronic obstructive pulmonary disease) (HCC)     Comment:  hx pulmonary embolism No date: Coronary artery disease No date: DDD (degenerative disc disease), cervical No date: DDD (degenerative disc disease), cervical No date: Depression No date: Diabetes mellitus without complication (HCC) No date: Dyspnea No date: Family history of adverse reaction to anesthesia     Comment:  father blood clot after surgery died No date: Headache     Comment:  history of migraines No date: History of pulmonary embolus (PE) No date: History of shingles No date: Hypertension No date: Lower extremity deep venous thrombosis (HCC) No date: Lower extremity edema No date: Multiple closed  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, subsequent encounter No date: Night terrors No date: OCD (obsessive compulsive disorder) No date: Osteoarthritis No date: Osteoporosis No date: PTSD (post-traumatic stress disorder) No date: Vitamin B 12 deficiency  Past Surgical History: 2004: CARDIAC CATHETERIZATION No date: CERVICAL LAMINOPLASTY No date: CESAREAN SECTION     Comment:  x2 No date: CHOLECYSTECTOMY No date: DILATION AND CURETTAGE OF UTERUS No date: FRACTURE SURGERY 2004: GASTRIC BYPASS No date: IVC FILTER PLACEMENT ([facility] HX) 07/30/2016: JOINT REPLACEMENT; Left     Comment:  hip 2015: JOINT REPLACEMENT; Right     Comment:  knee 2004 & 2006: KNEE ARTHROSCOPY; Left 2014: LAMINECTOMY     Comment:  lumbar 3-6 sacral 1-3  fusion No date: NECK SURGERY     Comment:   times 2 05/21/2015: PERIPHERAL VASCULAR CATHETERIZATION; N/A     Comment:  Procedure: IVC Filter Insertion;  Surgeon: Renford DillsGregory G               Schnier, MD;  Location: [facility] INVASIVE CV LAB;  Service:               Cardiovascular;  Laterality: N/A; 07/23/2015: PERIPHERAL VASCULAR CATHETERIZATION; N/A     Comment:  Procedure: IVC Filter Removal;  Surgeon: Renford DillsGregory G               Schnier, MD;  Location: [facility] INVASIVE CV LAB;  Service:               Cardiovascular;  Laterality: N/A; 07/21/2016: PERIPHERAL VASCULAR CATHETERIZATION; N/A     Comment:  Procedure: IVC  Filter Insertion;  Surgeon: Renford Dills, MD;  Location: [facility] INVASIVE CV LAB;  Service:               Cardiovascular;  Laterality: N/A; 10/16/2016: PERIPHERAL VASCULAR CATHETERIZATION; N/A     Comment:  Procedure: IVC Filter Removal;  Surgeon: Renford Dills, MD;  Location: [facility] INVASIVE CV LAB;  Service:               Cardiovascular;  Laterality: N/A; 07/26/2017  09/01/2017: TOOTH EXTRACTION 07/30/2016: TOTAL HIP ARTHROPLASTY; Left     Comment:  Procedure: TOTAL HIP ARTHROPLASTY ANTERIOR  APPROACH;                Surgeon: Kennedy Bucker, MD;  Location: [facility] ORS;  Service:              Orthopedics;  Laterality: Left; 05/30/2015: TOTAL KNEE ARTHROPLASTY; Right     Comment:  Procedure: TOTAL KNEE ARTHROPLASTY;  Surgeon: Kennedy Bucker, MD;  Location: [facility] ORS;  Service: Orthopedics;                Laterality: Right;  BMI    Body Mass Index:  37.71 kg/m      Reproductive/Obstetrics negative OB ROS                            Anesthesia Physical Anesthesia Plan  ASA: III  Anesthesia Plan: General   Post-op Pain Management:    Induction: Intravenous  PONV Risk Score and Plan: Propofol infusion and TIVA  Airway Management Planned: Natural Airway and Nasal Cannula  Additional Equipment:   Intra-op Plan:   Post-operative Plan:   Informed Consent: I have reviewed the patients History and Physical, chart, labs and discussed the procedure including the risks, benefits and alternatives for the proposed anesthesia with the patient or authorized representative who has indicated his/her understanding and acceptance.   Dental Advisory Given  Plan Discussed with: Anesthesiologist, CRNA and Surgeon  Anesthesia Plan Comments: (Patient consented for risks of anesthesia including but not limited to:  - adverse reactions to medications - risk of intubation if required - damage to teeth, lips or other oral mucosa - sore throat or hoarseness - Damage to heart, brain, lungs or loss of life  Patient voiced understanding.)        Anesthesia Quick Evaluation

## 2018-04-01 NOTE — Anesthesia Post-op Follow-up Note (Signed)
Anesthesia QCDR form completed.        

## 2018-04-01 NOTE — H&P (Signed)
Outpatient short stay form Pre-procedure 04/01/2018 10:36 AM Christena Deem MD  Primary Physician: Dr Barbette Reichmann  Reason for visit: EGD and colonoscopy  History of present illness: Patient is a 67 year old female presenting today as above.  Her colon cancer screening is due.  Her last colonoscopy was in 2004.  That was before she had her Roux-en-Y gastric bypass.  He also has been experiencing some nausea and vomiting.  She states that that has improved.  He takes no aspirin or blood thinning agent with the exception of 81 mg aspirin.  She was previously taking Eliquis but that had been discontinued several months ago per patient.  She tolerated her prep.  She stated that she did eat a regular breakfast yesterday morning.  Rectal exam this morning was negative for stool.    Current Facility-Administered Medications:  .  0.9 %  sodium chloride infusion, , Intravenous, Continuous, Christena Deem, MD .  ipratropium-albuterol (DUONEB) 0.5-2.5 (3) MG/3ML nebulizer solution 3 mL, 3 mL, Nebulization, Once, Piscitello, Cleda Mccreedy, MD .  lidocaine (PF) (XYLOCAINE) 1 % injection 2 mL, 2 mL, Intradermal, Once, Christena Deem, MD .  lidocaine (PF) (XYLOCAINE) 1 % injection, , , ,   Medications Prior to Admission  Medication Sig Dispense Refill Last Dose  . naloxone (NARCAN) nasal spray 4 mg/0.1 mL Place 1 spray into the nose once.     Marland Kitchen OLANZapine (ZYPREXA) 2.5 MG tablet Take 2.5 mg by mouth at bedtime.     Marland Kitchen olmesartan (BENICAR) 40 MG tablet Take 40 mg by mouth daily.     . Oxycodone HCl 10 MG TABS Take 20 mg by mouth every 12 (twelve) hours.     Marland Kitchen acetaminophen (TYLENOL) 500 MG tablet Take 1,500 mg by mouth 3 (three) times daily. With Gabapentin   Taking  . apixaban (ELIQUIS) 5 MG TABS tablet Take 5 mg by mouth 2 (two) times daily.    Not Taking at Unknown time  . aspirin 325 MG tablet Take 325 mg by mouth daily.   03/31/2018  . baclofen (LIORESAL) 10 MG tablet Take 5 mg by mouth 2  (two) times daily.    Taking  . CALCIUM CITRATE PO Take 2,400 mg by mouth daily at 2 PM.    Taking  . carvedilol (COREG) 12.5 MG tablet Take 12.5 mg by mouth 2 (two) times daily with a meal.   04/01/2018 at 0700  . Cholecalciferol (VITAMIN D3) 5000 UNITS CAPS Take 25,000 Units by mouth daily at 2 PM.    Taking  . clonazePAM (KLONOPIN) 0.5 MG tablet Take 0.5 mg by mouth 2 (two) times daily.   Taking  . Cyanocobalamin (VITAMIN B-12) 2500 MCG SUBL Place 5,000 mcg under the tongue daily at 2 PM.    Taking  . denosumab (PROLIA) 60 MG/ML SOLN injection Inject 60 mg into the skin every 6 (six) months. Administer in upper arm, thigh, or abdomen   Taking  . diphenhydrAMINE (SOMINEX) 25 MG tablet Take 50 mg by mouth at bedtime. 50 mg at bedtime, if patient is manic due to BIPOLAR 1 she will take 100 mg at bedtime.   Taking  . divalproex (DEPAKOTE ER) 500 MG 24 hr tablet Take 1,500 mg by mouth daily.    04/01/2018 at 0700  . DULoxetine (CYMBALTA) 60 MG capsule Take 120 mg by mouth daily.    04/01/2018 at 0700  . fentaNYL (DURAGESIC - DOSED MCG/HR) 75 MCG/HR Place 1 patch (75 mcg total) onto the  skin every 3 (three) days. (Patient not taking: Reported on 04/01/2018) 5 patch 0 Not Taking at Unknown time  . ferrous sulfate 325 (65 FE) MG tablet Take 650 mg by mouth daily at 2 PM.    Taking  . fluticasone (FLONASE) 50 MCG/ACT nasal spray Place 1 spray into both nostrils 2 (two) times daily.    Taking  . gabapentin (NEURONTIN) 400 MG capsule Take 1,200 mg by mouth 3 (three) times daily. With Tylenol 1500 mg   04/01/2018 at 0700  . Ginkgo Biloba 60 MG CAPS Take 120 mg by mouth daily at 2 PM.    Taking  . Magnesium Oxide -Mg Supplement 250 MG TABS Take 500 mg by mouth daily at 2 PM.    Taking  . Melatonin 1 MG TABS Take 1 tablet (1 mg total) by mouth at bedtime as needed. 5 tablet 0 Taking  . metFORMIN (GLUCOPHAGE) 1000 MG tablet Take 2,000 mg by mouth daily with breakfast.    Taking  . Multiple Vitamins-Minerals  (ONE-A-DAY WOMENS 50 PLUS PO) Take 2 tablets by mouth daily at 2 PM.    Taking  . nitroGLYCERIN (NITROSTAT) 0.4 MG SL tablet Place 0.4 mg under the tongue every 5 (five) minutes as needed. For chest pain.   Taking  . ondansetron (ZOFRAN) 4 MG tablet Take 1 tablet (4 mg total) by mouth daily as needed for nausea or vomiting. (Patient taking differently: Take 8 mg by mouth daily as needed for nausea or vomiting. ) 30 tablet 0 Taking  . oxyCODONE (ROXICODONE) 15 MG immediate release tablet Take 15 mg by mouth 3 (three) times daily.  0 04/01/2018 at 0700  . protein supplement shake (PREMIER PROTEIN) LIQD Take 11 oz by mouth daily with breakfast. CARAMEL OR STRAWBERRY PREFERRED   Taking  . traZODone (DESYREL) 100 MG tablet Take 1 tablet (100 mg total) by mouth at bedtime. 5 tablet 0 Taking  . vitamin E 1000 UNIT capsule Take 2,000 Units by mouth at bedtime.    Taking  . Zinc 50 MG TABS Take 100 mg by mouth daily at 2 PM.    Taking     Allergies  Allergen Reactions  . Bee Venom Anaphylaxis  . Morphine And Related Anaphylaxis    Blisters itching, N&V, face and throat swelling  . Adhesive [Tape] Other (See Comments)    Itching and water blisters. Paper tape is OK.  . Celebrex [Celecoxib] Swelling  . Tegaderm Ag Mesh [Silver] Other (See Comments)    itching and water blisters itching and water blisters  . Grape Seed Itching and Swelling    Lips and mouth swelling/itching.  Grapes not grape seed  . Latuda [Lurasidone Hcl] Other (See Comments)  . Tapentadol Other (See Comments)     Past Medical History:  Diagnosis Date  . Anxiety   . Arthritis    degenerative artgritis  osteoarthritis  . Bipolar 1 disorder (HCC)   . Bipolar disorder (HCC)   . COPD (chronic obstructive pulmonary disease) (HCC)    hx pulmonary embolism  . Coronary artery disease   . DDD (degenerative disc disease), cervical   . DDD (degenerative disc disease), cervical   . Depression   . Diabetes mellitus without  complication (HCC)   . Dyspnea   . Family history of adverse reaction to anesthesia    father blood clot after surgery died  . Headache    history of migraines  . History of pulmonary embolus (PE)   . History of  shingles   . Hypertension   . Lower extremity deep venous thrombosis (HCC)   . Lower extremity edema   . 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 Night terrors   . OCD (obsessive compulsive disorder)   . Osteoarthritis   . Osteoporosis   . PTSD (post-traumatic stress disorder)   . Vitamin B 12 deficiency     Review of systems:      Physical Exam    Heart and lungs: Good rate and rhythm without rub or gallop, lungs are bilaterally clear.    HEENT: Normocephalic atraumatic eyes are anicteric    Other:    Pertinant exam for procedure: Soft there is some mild tenderness in the periumbilical region.  No felt hernia however there is a small abdominal wall defect superior and to the right of the umbilicus.  This is probably about the size of a quarter.  There is no apparent hernia through this.  There is a distinct fullness noted in the left lower quadrant on palpation.  She is nontender otherwise.  Wound.    Planned proceedures: EGD and colonoscopy with indicated procedures. I have discussed the risks benefits and complications of procedures to include not limited to bleeding, infection, perforation and the risk of sedation and the patient wishes to proceed.    Christena DeemMartin U Karizma Cheek, MD Gastroenterology 04/01/2018  10:36 AM

## 2018-04-01 NOTE — Transfer of Care (Signed)
Immediate Anesthesia Transfer of Care Note  Patient: Amy Sweeney  Procedure(s) Performed: COLONOSCOPY WITH PROPOFOL (N/A ) ESOPHAGOGASTRODUODENOSCOPY (EGD) WITH PROPOFOL (N/A )  Patient Location: PACU and Endoscopy Unit  Anesthesia Type:General  Level of Consciousness: drowsy  Airway & Oxygen Therapy: Patient Spontanous Breathing and Patient connected to nasal cannula oxygen  Post-op Assessment: Report given to RN and Post -op Vital signs reviewed and stable  Post vital signs: Reviewed and stable  Last Vitals:  Vitals Value Taken Time  BP    Temp    Pulse    Resp    SpO2      Last Pain:  Vitals:   04/01/18 0928  TempSrc: Tympanic  PainSc: 0-No pain      Patients Stated Pain Goal: 8 (04/01/18 0928)  Complications: No apparent anesthesia complications

## 2018-04-01 NOTE — Op Note (Signed)
[facility] Patient Name: Amy MaladyRebecca Smelser Procedure Date: 04/01/2018 10:39 AM MRN: [id number] Account #: [id number] Date of Birth: [birth year] Admit Type: Outpatient Age: [age] [facility] Gender: Female Note Status: Finalized Procedure:            Colonoscopy Indications:          Screening for colorectal malignant neoplasm Providers:            Christena DeemMartin U. Shaneil Yazdi, MD Referring MD:         Barbette ReichmannVishwanath Hande, MD (Referring MD) Medicines:            Monitored Anesthesia Care Complications:        No immediate complications. Procedure:            Pre-Anesthesia Assessment:                       - ASA Grade Assessment: III - A patient with severe                        systemic disease.                       After obtaining informed consent, the colonoscope was                        passed under direct vision. Throughout the procedure,                        the patient's blood pressure, pulse, and oxygen                        saturations were monitored continuously. The was                        introduced through the anus and advanced to the the                        cecum, identified by appendiceal orifice and ileocecal                        valve. The colonoscopy was unusually difficult due to a                        redundant colon and significant looping. Successful                        completion of the procedure was aided by using manual                        pressure. Findings:      Multiple medium-mouthed diverticula were found in the sigmoid colon and       descending colon.      The retroflexed view of the distal rectum and anal verge was normal and       showed no anal or rectal abnormalities.      The digital rectal exam was normal. Impression:           - Diverticulosis in the sigmoid colon and in the                        descending  colon.                       - The distal rectum and anal verge are normal on                      retroflexion view.                       - No specimens collected. Recommendation:       - Discharge patient to home.                       - Advance diet as tolerated. Procedure Code(s):    --- Professional ---                       [phone], Colonoscopy, flexible; diagnostic, including                        collection of specimen(s) by brushing or washing, when                        performed (separate procedure) Diagnosis Code(s):    --- Professional ---                       Z12.11, Encounter for screening for malignant neoplasm                        of colon                       K57.30, Diverticulosis of large intestine without                        perforation or abscess without bleeding CPT copyright 2017 American Medical Association. All rights reserved. The codes documented in this report are preliminary and upon coder review may  be revised to meet current compliance requirements. Christena Deem, MD 04/01/2018 11:46:43 AM This report has been signed electronically. Number of Addenda: 0 Note Initiated On: 04/01/2018 10:39 AM Scope Withdrawal Time: 0 hours 10 minutes 24 seconds  Total Procedure Duration: 0 hours 29 minutes 20 seconds       [facility]

## 2018-04-01 NOTE — Anesthesia Postprocedure Evaluation (Signed)
Anesthesia Post Note  Patient: Amy GumRebecca G Sweeney  Procedure(s) Performed: COLONOSCOPY WITH PROPOFOL (N/A ) ESOPHAGOGASTRODUODENOSCOPY (EGD) WITH PROPOFOL (N/A )  Patient location during evaluation: Endoscopy Anesthesia Type: General Level of consciousness: awake and alert Pain management: pain level controlled Vital Signs Assessment: post-procedure vital signs reviewed and stable Respiratory status: spontaneous breathing, nonlabored ventilation, respiratory function stable and patient connected to nasal cannula oxygen Cardiovascular status: blood pressure returned to baseline and stable Postop Assessment: no apparent nausea or vomiting Anesthetic complications: no     Last Vitals:  Vitals:   04/01/18 1211 04/01/18 1221  BP: 136/84 131/82  Pulse: 70 64  Resp: 15 14  Temp:    SpO2: 98% 98%    Last Pain:  Vitals:   04/01/18 1221  TempSrc:   PainSc: 0-No pain                 Cleda MccreedyJoseph K Roschelle Calandra

## 2018-04-05 LAB — SURGICAL PATHOLOGY

## 2018-04-06 ENCOUNTER — Other Ambulatory Visit (HOSPITAL_COMMUNITY): Payer: Self-pay | Admitting: Gastroenterology

## 2018-04-06 DIAGNOSIS — R112 Nausea with vomiting, unspecified: Secondary | ICD-10-CM

## 2018-04-06 DIAGNOSIS — R1904 Left lower quadrant abdominal swelling, mass and lump: Secondary | ICD-10-CM

## 2018-04-06 DIAGNOSIS — R1909 Other intra-abdominal and pelvic swelling, mass and lump: Secondary | ICD-10-CM

## 2018-04-13 ENCOUNTER — Ambulatory Visit: Admission: RE | Admit: 2018-04-13 | Payer: Medicare Other | Source: Ambulatory Visit

## 2018-04-28 ENCOUNTER — Ambulatory Visit: Admission: RE | Admit: 2018-04-28 | Payer: Medicare Other | Source: Ambulatory Visit

## 2018-05-05 ENCOUNTER — Ambulatory Visit
Admission: RE | Admit: 2018-05-05 | Discharge: 2018-05-05 | Disposition: A | Discharge status: Home / Self Care | Payer: Medicare Other | Source: Ambulatory Visit | Attending: Gastroenterology | Admitting: Gastroenterology

## 2018-05-05 DIAGNOSIS — R112 Nausea with vomiting, unspecified: Secondary | ICD-10-CM

## 2018-05-05 DIAGNOSIS — M47816 Spondylosis without myelopathy or radiculopathy, lumbar region: Secondary | ICD-10-CM | POA: Insufficient documentation

## 2018-05-05 DIAGNOSIS — R1909 Other intra-abdominal and pelvic swelling, mass and lump: Secondary | ICD-10-CM

## 2018-05-05 DIAGNOSIS — R1900 Intra-abdominal and pelvic swelling, mass and lump, unspecified site: Secondary | ICD-10-CM | POA: Insufficient documentation

## 2018-05-05 LAB — POCT I-STAT CREATININE: CREATININE: 0.6 mg/dL (ref 0.44–1.00)

## 2018-05-05 MED ORDER — IOPAMIDOL (ISOVUE-300) INJECTION 61%
100.0000 mL | Freq: Once | INTRAVENOUS | Status: AC | PRN
  Administered 2018-05-05: 100 mL via INTRAVENOUS

## 2018-10-02 ENCOUNTER — Emergency Department: Payer: Medicare Other

## 2018-10-02 ENCOUNTER — Inpatient Hospital Stay
Admission: EM | Admit: 2018-10-02 | Discharge: 2018-10-08 | DRG: 470 | Disposition: A | Discharge status: Skilled Nursing Facility | Payer: Medicare Other | Attending: Internal Medicine | Admitting: Internal Medicine

## 2018-10-02 ENCOUNTER — Encounter: Payer: Self-pay | Admitting: Emergency Medicine

## 2018-10-02 ENCOUNTER — Other Ambulatory Visit: Payer: Self-pay

## 2018-10-02 DIAGNOSIS — F4310 Post-traumatic stress disorder, unspecified: Secondary | ICD-10-CM | POA: Diagnosis present

## 2018-10-02 DIAGNOSIS — Z7982 Long term (current) use of aspirin: Secondary | ICD-10-CM

## 2018-10-02 DIAGNOSIS — Z9049 Acquired absence of other specified parts of digestive tract: Secondary | ICD-10-CM

## 2018-10-02 DIAGNOSIS — Z7951 Long term (current) use of inhaled steroids: Secondary | ICD-10-CM

## 2018-10-02 DIAGNOSIS — Z91018 Allergy to other foods: Secondary | ICD-10-CM

## 2018-10-02 DIAGNOSIS — Z833 Family history of diabetes mellitus: Secondary | ICD-10-CM

## 2018-10-02 DIAGNOSIS — Z86711 Personal history of pulmonary embolism: Secondary | ICD-10-CM

## 2018-10-02 DIAGNOSIS — Z885 Allergy status to narcotic agent status: Secondary | ICD-10-CM

## 2018-10-02 DIAGNOSIS — F319 Bipolar disorder, unspecified: Secondary | ICD-10-CM | POA: Diagnosis present

## 2018-10-02 DIAGNOSIS — S72001A Fracture of unspecified part of neck of right femur, initial encounter for closed fracture: Principal | ICD-10-CM

## 2018-10-02 DIAGNOSIS — Z886 Allergy status to analgesic agent status: Secondary | ICD-10-CM

## 2018-10-02 DIAGNOSIS — M25511 Pain in right shoulder: Secondary | ICD-10-CM | POA: Diagnosis present

## 2018-10-02 DIAGNOSIS — I10 Essential (primary) hypertension: Secondary | ICD-10-CM | POA: Diagnosis present

## 2018-10-02 DIAGNOSIS — J449 Chronic obstructive pulmonary disease, unspecified: Secondary | ICD-10-CM | POA: Diagnosis present

## 2018-10-02 DIAGNOSIS — M5030 Other cervical disc degeneration, unspecified cervical region: Secondary | ICD-10-CM | POA: Diagnosis present

## 2018-10-02 DIAGNOSIS — M25551 Pain in right hip: Secondary | ICD-10-CM

## 2018-10-02 DIAGNOSIS — E1165 Type 2 diabetes mellitus with hyperglycemia: Secondary | ICD-10-CM | POA: Diagnosis present

## 2018-10-02 DIAGNOSIS — M545 Low back pain: Secondary | ICD-10-CM | POA: Diagnosis present

## 2018-10-02 DIAGNOSIS — Z7984 Long term (current) use of oral hypoglycemic drugs: Secondary | ICD-10-CM

## 2018-10-02 DIAGNOSIS — Z9181 History of falling: Secondary | ICD-10-CM

## 2018-10-02 DIAGNOSIS — G894 Chronic pain syndrome: Secondary | ICD-10-CM | POA: Diagnosis present

## 2018-10-02 DIAGNOSIS — M25521 Pain in right elbow: Secondary | ICD-10-CM

## 2018-10-02 DIAGNOSIS — Z91030 Bee allergy status: Secondary | ICD-10-CM

## 2018-10-02 DIAGNOSIS — Y92009 Unspecified place in unspecified non-institutional (private) residence as the place of occurrence of the external cause: Secondary | ICD-10-CM

## 2018-10-02 DIAGNOSIS — Z981 Arthrodesis status: Secondary | ICD-10-CM

## 2018-10-02 DIAGNOSIS — E871 Hypo-osmolality and hyponatremia: Secondary | ICD-10-CM

## 2018-10-02 DIAGNOSIS — Z96651 Presence of right artificial knee joint: Secondary | ICD-10-CM | POA: Diagnosis present

## 2018-10-02 DIAGNOSIS — Z79899 Other long term (current) drug therapy: Secondary | ICD-10-CM

## 2018-10-02 DIAGNOSIS — R809 Proteinuria, unspecified: Secondary | ICD-10-CM | POA: Diagnosis present

## 2018-10-02 DIAGNOSIS — Z6837 Body mass index (BMI) 37.0-37.9, adult: Secondary | ICD-10-CM

## 2018-10-02 DIAGNOSIS — Z96649 Presence of unspecified artificial hip joint: Secondary | ICD-10-CM

## 2018-10-02 DIAGNOSIS — M21151 Varus deformity, not elsewhere classified, right hip: Secondary | ICD-10-CM | POA: Diagnosis present

## 2018-10-02 DIAGNOSIS — F429 Obsessive-compulsive disorder, unspecified: Secondary | ICD-10-CM | POA: Diagnosis present

## 2018-10-02 DIAGNOSIS — W19XXXA Unspecified fall, initial encounter: Secondary | ICD-10-CM

## 2018-10-02 DIAGNOSIS — E1129 Type 2 diabetes mellitus with other diabetic kidney complication: Secondary | ICD-10-CM | POA: Diagnosis present

## 2018-10-02 DIAGNOSIS — I2510 Atherosclerotic heart disease of native coronary artery without angina pectoris: Secondary | ICD-10-CM | POA: Diagnosis present

## 2018-10-02 DIAGNOSIS — Z96642 Presence of left artificial hip joint: Secondary | ICD-10-CM | POA: Diagnosis present

## 2018-10-02 DIAGNOSIS — M810 Age-related osteoporosis without current pathological fracture: Secondary | ICD-10-CM | POA: Diagnosis present

## 2018-10-02 DIAGNOSIS — W010XXA Fall on same level from slipping, tripping and stumbling without subsequent striking against object, initial encounter: Secondary | ICD-10-CM | POA: Diagnosis present

## 2018-10-02 DIAGNOSIS — Z91048 Other nonmedicinal substance allergy status: Secondary | ICD-10-CM

## 2018-10-02 DIAGNOSIS — Z9884 Bariatric surgery status: Secondary | ICD-10-CM

## 2018-10-02 DIAGNOSIS — Z888 Allergy status to other drugs, medicaments and biological substances status: Secondary | ICD-10-CM

## 2018-10-02 DIAGNOSIS — Z79891 Long term (current) use of opiate analgesic: Secondary | ICD-10-CM

## 2018-10-02 DIAGNOSIS — F411 Generalized anxiety disorder: Secondary | ICD-10-CM | POA: Diagnosis present

## 2018-10-02 DIAGNOSIS — Z79890 Hormone replacement therapy (postmenopausal): Secondary | ICD-10-CM

## 2018-10-02 DIAGNOSIS — E669 Obesity, unspecified: Secondary | ICD-10-CM | POA: Diagnosis present

## 2018-10-02 DIAGNOSIS — Z8619 Personal history of other infectious and parasitic diseases: Secondary | ICD-10-CM

## 2018-10-02 DIAGNOSIS — Z86718 Personal history of other venous thrombosis and embolism: Secondary | ICD-10-CM

## 2018-10-02 LAB — CBC WITH DIFFERENTIAL/PLATELET
Abs Immature Granulocytes: 0.03 10*3/uL (ref 0.00–0.07)
Basophils Absolute: 0 10*3/uL (ref 0.0–0.1)
Basophils Relative: 0 %
Eosinophils Absolute: 0 10*3/uL (ref 0.0–0.5)
Eosinophils Relative: 0 %
HEMATOCRIT: 37.7 % (ref 36.0–46.0)
Hemoglobin: 12.8 g/dL (ref 12.0–15.0)
Immature Granulocytes: 0 %
LYMPHS ABS: 2.6 10*3/uL (ref 0.7–4.0)
Lymphocytes Relative: 34 %
MCH: 30.8 pg (ref 26.0–34.0)
MCHC: 34 g/dL (ref 30.0–36.0)
MCV: 90.6 fL (ref 80.0–100.0)
Monocytes Absolute: 0.5 10*3/uL (ref 0.1–1.0)
Monocytes Relative: 7 %
Neutro Abs: 4.4 10*3/uL (ref 1.7–7.7)
Neutrophils Relative %: 59 %
Platelets: 276 10*3/uL (ref 150–400)
RBC: 4.16 MIL/uL (ref 3.87–5.11)
RDW: 12.1 % (ref 11.5–15.5)
WBC: 7.5 10*3/uL (ref 4.0–10.5)
nRBC: 0 % (ref 0.0–0.2)

## 2018-10-02 LAB — BASIC METABOLIC PANEL
Anion gap: 10 (ref 5–15)
BUN: 13 mg/dL (ref 8–23)
CO2: 26 mmol/L (ref 22–32)
Calcium: 8.4 mg/dL — ABNORMAL LOW (ref 8.9–10.3)
Chloride: 85 mmol/L — ABNORMAL LOW (ref 98–111)
Creatinine, Ser: 0.73 mg/dL (ref 0.44–1.00)
GFR calc Af Amer: >60 mL/min (ref >60)
GFR calc non Af Amer: >60 mL/min (ref >60)
Glucose, Bld: 145 mg/dL — ABNORMAL HIGH (ref 70–99)
Potassium: 4.3 mmol/L (ref 3.5–5.1)
Sodium: 121 mmol/L — ABNORMAL LOW (ref 135–145)

## 2018-10-02 LAB — TROPONIN I: Troponin I: <0.03 ng/mL (ref <0.03)

## 2018-10-02 MED ORDER — FENTANYL CITRATE (PF) 100 MCG/2ML IJ SOLN
50.0000 ug | Freq: Once | INTRAMUSCULAR | Status: AC
  Administered 2018-10-02: 50 ug via INTRAVENOUS
  Filled 2018-10-02: qty 2

## 2018-10-02 NOTE — ED Provider Notes (Signed)
[facility] Emergency Department Provider Note   ____________________________________________   First MD Initiated Contact with Patient 10/02/18 2307     (approximate)  I have reviewed the triage vital signs and the nursing notes.   HISTORY  Chief Complaint Fall and Hip Pain    HPI Amy Sweeney is a 68 y.o. female brought to the ED from home via EMS status post mechanical fall with right hip pain.  Patient has a history of COPD, diabetes, prior orthopedic surgeries, chronic pain ([facility] patient) who moved today.  She was going to the restroom when her right slipper caught on a rolled up rug and she fell onto her right hip.  Did not strike head or LOC.  Does not take Eliquis as her medication list suggest.  States she was taken off quite some time ago as she has not had issues with recurrent PEs and is only on a baby aspirin daily.  Presents to the ED with right hip rotation and shortening.  Voices no other complaints other than right hip pain.  Denies chest pain, shortness of breath, abdominal pain, nausea or vomiting.   Past Medical History:  Diagnosis Date  . Anxiety   . Arthritis    degenerative artgritis  osteoarthritis  . Bipolar 1 disorder (HCC)   . Bipolar disorder (HCC)   . COPD (chronic obstructive pulmonary disease) (HCC)    hx pulmonary embolism  . Coronary artery disease   . DDD (degenerative disc disease), cervical   . DDD (degenerative disc disease), cervical   . Depression   . Diabetes mellitus without complication (HCC)    Pt takes metformin.   Marland Kitchen Dyspnea   . Family history of adverse reaction to anesthesia    father blood clot after surgery died  . Headache    history of migraines  . History of pulmonary embolus (PE)   . History of shingles   . Hypertension   . Lower extremity deep venous thrombosis (HCC)   . Lower extremity edema   . 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 Night terrors   . OCD (obsessive compulsive disorder)   . Osteoarthritis   . Osteoporosis   . PTSD (post-traumatic stress disorder)   . Vitamin B 12 deficiency     Patient Active Problem List   Diagnosis Date Noted  . Chest pain at rest 03/03/2018  . Hypertension, essential 03/03/2018  . Closed fracture of proximal tibia 02/22/2018  . DDD (degenerative disc disease), cervical 02/22/2018  . Chronic anticoagulation (Eliquis) 02/22/2018  . History of hip replacement (Left) 02/22/2018  . Chronic pain syndrome 02/22/2018  . Pain medication agreement (w/ [facility]) 02/22/2018  . Type 2 diabetes mellitus without complication, without long-term current use of insulin (HCC) 11/30/2017  . Abdominal wall bulge 11/10/2017  . Age-related osteoporosis without current pathological fracture 09/22/2017  . Metformin overdose 04/01/2017  . COPD (chronic obstructive pulmonary disease) (HCC) 04/01/2017  . Encounter for long-term opiate analgesic use 12/15/2016  . Psychophysiological insomnia 11/20/2016  . Bipolar depression (HCC) 11/20/2016  . Back muscle spasm 10/08/2016  . Hypercoagulable state (HCC) 09/28/2016  . DJD (degenerative joint disease) 09/28/2016  . History of DVT (deep vein thrombosis) 07/16/2016  . Osteoarthritis of hip (Right) 06/18/2016  . Chronic hip pain (Right) 05/08/2016  . Anxiety state 04/14/2016  . Chronic bilateral thoracic back pain 04/14/2016  . Acute right ankle pain 03/12/2016  .  Traumatic arthritis of knee 05/30/2015  . Bipolar 1 disorder, mixed, moderate (HCC) 10/26/2014  . Post-traumatic osteoarthritis of one knee 08/23/2014  . Lumbar radicular pain 05/31/2014  . Cervical neck pain with evidence of disc disease 04/24/2014  . Chronic low back pain 04/24/2014  . Bilateral chronic knee pain 04/24/2014  . Leg pain 03/30/2014  . Pulmonary embolism on right (HCC) 02/15/2014  . Tibial plateau fracture 01/16/2014  .  Diabetes (HCC) 02/20/2013  . Bipolar disorder (HCC) 02/20/2013  . Cervical disc disease 02/20/2013  . Chronic, continuous use of opioids 02/20/2013  . 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 Obesity, Class II, BMI 35-39.9 02/20/2013    Past Surgical History:  Procedure Laterality Date  . CARDIAC CATHETERIZATION  2004  . CERVICAL LAMINOPLASTY    . CESAREAN SECTION     x2  . CHOLECYSTECTOMY    . COLONOSCOPY WITH PROPOFOL N/A 04/01/2018   Procedure: COLONOSCOPY WITH PROPOFOL;  Surgeon: Christena DeemSkulskie, Martin U, MD;  [facility];  Service: Endoscopy;  Laterality: N/A;  . DILATION AND CURETTAGE OF UTERUS    . ESOPHAGOGASTRODUODENOSCOPY (EGD) WITH PROPOFOL N/A 04/01/2018   Procedure: ESOPHAGOGASTRODUODENOSCOPY (EGD) WITH PROPOFOL;  Surgeon: Christena DeemSkulskie, Martin U, MD;  [facility];  Service: Endoscopy;  Laterality: N/A;  . FRACTURE SURGERY    . GASTRIC BYPASS  2004  . IVC FILTER PLACEMENT ([facility] HX)    . JOINT REPLACEMENT Left 07/30/2016   hip  . JOINT REPLACEMENT Right 2015   knee  . KNEE ARTHROSCOPY Left 2004 & 2006  . LAMINECTOMY  2014   lumbar 3-6 sacral 1-3  fusion  . NECK SURGERY      times 2  . PERIPHERAL VASCULAR CATHETERIZATION N/A 05/21/2015   Procedure: IVC Filter Insertion;  Surgeon: Renford DillsGregory G Schnier, MD;  Location: [facility] INVASIVE CV LAB;  Service: Cardiovascular;  Laterality: N/A;  . PERIPHERAL VASCULAR CATHETERIZATION N/A 07/23/2015   Procedure: IVC Filter Removal;  Surgeon: Renford DillsGregory G Schnier, MD;  Location: [facility] INVASIVE CV LAB;  Service: Cardiovascular;  Laterality: N/A;  . PERIPHERAL VASCULAR CATHETERIZATION N/A 07/21/2016   Procedure: IVC Filter Insertion;  Surgeon: Renford DillsGregory G Schnier, MD;  Location: [facility] INVASIVE CV LAB;  Service: Cardiovascular;  Laterality: N/A;  . PERIPHERAL VASCULAR CATHETERIZATION N/A 10/16/2016   Procedure: IVC Filter Removal;  Surgeon: Renford DillsGregory G Schnier, MD;  Location: [facility] INVASIVE CV LAB;  Service: Cardiovascular;   Laterality: N/A;  . TOOTH EXTRACTION  07/26/2017  09/01/2017  . TOTAL HIP ARTHROPLASTY Left 07/30/2016   Procedure: TOTAL HIP ARTHROPLASTY ANTERIOR APPROACH;  Surgeon: Kennedy BuckerMichael Menz, MD;  Location: [facility] ORS;  Service: Orthopedics;  Laterality: Left;  . TOTAL KNEE ARTHROPLASTY Right 05/30/2015   Procedure: TOTAL KNEE ARTHROPLASTY;  Surgeon: Kennedy BuckerMichael Menz, MD;  Location: [facility] ORS;  Service: Orthopedics;  Laterality: Right;    Prior to Admission medications   Medication Sig Start Date End Date Taking? Authorizing Provider  acetaminophen (TYLENOL) 500 MG tablet Take 1,500 mg by mouth 3 (three) times daily. With Gabapentin    [provider]  apixaban (ELIQUIS) 5 MG TABS tablet Take 5 mg by mouth 2 (two) times daily.     [provider]  aspirin 325 MG tablet Take 325 mg by mouth daily.    [provider]  baclofen (LIORESAL) 10 MG tablet Take 5 mg by mouth 2 (two) times daily.     [provider]  CALCIUM CITRATE PO Take 2,400 mg by mouth daily at 2 PM.  [provider]  carvedilol (COREG) 12.5 MG tablet Take 12.5 mg by mouth 2 (two) times daily with a meal.    [provider]  Cholecalciferol (VITAMIN D3) 5000 UNITS CAPS Take 25,000 Units by mouth daily at 2 PM.     [provider]  clonazePAM (KLONOPIN) 0.5 MG tablet Take 0.5 mg by mouth 2 (two) times daily.    [provider]  Cyanocobalamin (VITAMIN B-12) 2500 MCG SUBL Place 5,000 mcg under the tongue daily at 2 PM.     [provider]  denosumab (PROLIA) 60 MG/ML SOLN injection Inject 60 mg into the skin every 6 (six) months. Administer in upper arm, thigh, or abdomen    [provider]  diphenhydrAMINE (SOMINEX) 25 MG tablet Take 50 mg by mouth at bedtime. 50 mg at bedtime, if patient is manic due to BIPOLAR 1 she will take 100 mg at bedtime.    [provider]  divalproex (DEPAKOTE ER) 500 MG 24 hr tablet Take 1,500 mg by mouth daily.      [provider]  DULoxetine (CYMBALTA) 60 MG capsule Take 120 mg by mouth daily.     [provider]  fentaNYL (DURAGESIC - DOSED MCG/HR) 75 MCG/HR Place 1 patch (75 mcg total) onto the skin every 3 (three) days. Patient not taking: Reported on 04/01/2018 08/01/16   Dedra Skeens, PA-C  ferrous sulfate 325 (65 FE) MG tablet Take 650 mg by mouth daily at 2 PM.     [provider]  fluticasone (FLONASE) 50 MCG/ACT nasal spray Place 1 spray into both nostrils 2 (two) times daily.     [provider]  gabapentin (NEURONTIN) 400 MG capsule Take 1,200 mg by mouth 3 (three) times daily. With Tylenol 1500 mg 05/24/16   [provider]  Ginkgo Biloba 60 MG CAPS Take 120 mg by mouth daily at 2 PM.     [provider]  Magnesium Oxide -Mg Supplement 250 MG TABS Take 500 mg by mouth daily at 2 PM.     [provider]  Melatonin 1 MG TABS Take 1 tablet (1 mg total) by mouth at bedtime as needed. 03/11/16   Cuthriell, Delorise Royals, PA-C  metFORMIN (GLUCOPHAGE) 1000 MG tablet Take 2,000 mg by mouth daily with breakfast.     [provider]  Multiple Vitamins-Minerals (ONE-A-DAY WOMENS 50 PLUS PO) Take 2 tablets by mouth daily at 2 PM.     [provider]  naloxone [facility]) nasal spray 4 mg/0.1 mL Place 1 spray into the nose once.    [provider]  nitroGLYCERIN (NITROSTAT) 0.4 MG SL tablet Place 0.4 mg under the tongue every 5 (five) minutes as needed. For chest pain.    [provider]  OLANZapine (ZYPREXA) 2.5 MG tablet Take 2.5 mg by mouth at bedtime.    [provider]  olmesartan (BENICAR) 40 MG tablet Take 40 mg by mouth daily.    [provider]  oxyCODONE (ROXICODONE) 15 MG immediate release tablet Take 15 mg by mouth 3 (three) times daily. 03/15/17   [provider]  Oxycodone HCl 10 MG TABS Take 20 mg by mouth every 12 (twelve) hours.    [provider]  protein supplement  shake (PREMIER PROTEIN) LIQD Take 11 oz by mouth daily with breakfast. CARAMEL OR STRAWBERRY PREFERRED    [provider]  traZODone (DESYREL) 100 MG tablet Take 1 tablet (100 mg total) by mouth at bedtime. 03/11/16  Cuthriell, Delorise RoyalsJonathan D, PA-C  vitamin E 1000 UNIT capsule Take 2,000 Units by mouth at bedtime.     [provider]  Zinc 50 MG TABS Take 100 mg by mouth daily at 2 PM.     [provider]    Allergies Bee venom; Morphine and related; Adhesive [tape]; Celebrex [celecoxib]; Tegaderm ag mesh [silver]; Grape seed; Latuda [lurasidone hcl]; and Tapentadol  History reviewed. No pertinent family history.  Social History Social History   Tobacco Use  . Smoking status: Never Smoker  . Smokeless tobacco: Never Used  Substance Use Topics  . Alcohol use: Yes    Comment: SPECIAL OCCASIONS  . Drug use: No    Review of Systems  Constitutional: No fever/chills Eyes: No visual changes. ENT: No sore throat. Cardiovascular: Denies chest pain. Respiratory: Denies shortness of breath. Gastrointestinal: No abdominal pain.  No nausea, no vomiting.  No diarrhea.  No constipation. Genitourinary: Negative for dysuria. Musculoskeletal: Positive for right hip pain.  Negative for back pain. Skin: Negative for rash. Neurological: Negative for headaches, focal weakness or numbness.   ____________________________________________   PHYSICAL EXAM:  VITAL SIGNS: ED Triage Vitals [10/02/18 2247]  Enc Vitals Group     BP 131/84     Pulse Rate 81     Resp 18     Temp 98.2 F (36.8 C)     Temp Source Oral     SpO2 93 %     Weight 244 lb (110.7 kg)     Height 5\' 8"  (1.727 m)     Head Circumference      Peak Flow      Pain Score 10     Pain Loc      Pain Edu?      Excl. in GC?     Constitutional: Alert and oriented. Well appearing and in mild acute distress. Eyes: Conjunctivae are normal. PERRL. EOMI. Head: Atraumatic. Nose: No  congestion/rhinnorhea. Mouth/Throat: Mucous membranes are moist.  Oropharynx non-erythematous. Neck: No stridor.  No cervical spine tenderness to palpation. Cardiovascular: Normal rate, regular rhythm. Grossly normal heart sounds.  Good peripheral circulation. Respiratory: Normal respiratory effort.  No retractions. Lungs CTAB. Gastrointestinal: Soft and nontender. No distention. No abdominal bruits. No CVA tenderness. Musculoskeletal:  RLE: Shortened and externally rotated.  Tender to palpation right hip.  Decreased range of motion secondary to pain.  2+ femoral distal pulses.  Brisk, less than 5-second capillary refill. Neurologic:  Normal speech and language. No gross focal neurologic deficits are appreciated.  Skin:  Skin is warm, dry and intact. No rash noted. Psychiatric: Mood and affect are normal. Speech and behavior are normal.  ____________________________________________   LABS (all labs ordered are listed, but only abnormal results are displayed)  Labs Reviewed  BASIC METABOLIC PANEL - Abnormal; Notable for the following components:      Result Value   Sodium 121 (*)    Chloride 85 (*)    Glucose, Bld 145 (*)    Calcium 8.4 (*)    All other components within normal limits  CBC WITH DIFFERENTIAL/PLATELET  TROPONIN I   ____________________________________________  EKG  ED ECG REPORT I, Tamaria Dunleavy J, the attending physician, personally viewed and interpreted this ECG.   Date: 10/02/2018  EKG Time: 2257  Rate: 78  Rhythm: normal EKG, normal sinus rhythm  Axis: WNL  Intervals:none  ST&T Change: Nonspecific  ____________________________________________  RADIOLOGY  ED MD interpretation:  Right femoral neck fracture;  CXR unremarkable  Official radiology report(s):  Dg Chest 1 View  Result Date: 2018/10/29 CLINICAL DATA:  c/o mechanical fall tonight over a rug and fell onto right side and on the ground approx 45 minutes, pain from right knee to right hip, denies  hitting head or LOCpre-op EXAM: CHEST  1 VIEW COMPARISON:  None. FINDINGS: Normal cardiac silhouette. Low lung volumes. No effusion, infiltrate or pneumothorax. IMPRESSION: Low lung volumes.  No acute findings. Electronically Signed   By: Genevive Bi M.D.   On: 2018-10-29 23:35   Dg Hip Unilat  With Pelvis 2-3 Views Right  Result Date: 10-29-2018 CLINICAL DATA:  68 year old female with fall and right hip pain. EXAM: DG HIP (WITH OR WITHOUT PELVIS) 2-3V RIGHT COMPARISON:  CT of the abdomen pelvis dated 05/05/2018 FINDINGS: There is a mildly displaced fracture of the right femoral neck with mild varus angulation. No dislocation. There is advanced osteopenia. There is a left hip arthroplasty. The soft tissues are grossly unremarkable. IMPRESSION: Right femoral neck fracture. Electronically Signed   By: Elgie Collard M.D.   On: 2018/10/29 23:34    ____________________________________________   PROCEDURES  Procedure(s) performed: None  Procedures  Critical Care performed: Yes, see critical care note(s)   CRITICAL CARE Performed by: Irean Hong   Total critical care time: 30 minutes  Critical care time was exclusive of separately billable procedures and treating other patients.  Critical care was necessary to treat or prevent imminent or life-threatening deterioration.  Critical care was time spent personally by me on the following activities: development of treatment plan with patient and/or surrogate as well as nursing, discussions with consultants, evaluation of patient's response to treatment, examination of patient, obtaining history from patient or surrogate, ordering and performing treatments and interventions, ordering and review of laboratory studies, ordering and review of radiographic studies, pulse oximetry and re-evaluation of patient's condition.  ____________________________________________   INITIAL IMPRESSION / ASSESSMENT AND PLAN / ED COURSE  As part of my  medical decision making, I reviewed the following data within the electronic MEDICAL RECORD NUMBER History obtained from family, Nursing notes reviewed and incorporated, Labs reviewed, EKG interpreted, Radiograph reviewed, Discussed with admitting physician and Notes from prior ED visits   68 year old female who presents s/p mechanical fall with right hip pain. Differential diagnosis includes but is not limited to hip fracture, dislocation, musculoskeletal sprain.  Will administer 50 mcg IV fentanyl for pain and obtain plain film imaging studies.   Clinical Course as of 10-30-51  Wynelle Link 29-Oct-2018  2342 Spoke with orthopedics on-call Dr. Franco Collet to inform him of patient's injury and admission.  Discussed with Dr. Nancy Marus from hospital services who will evaluate patient in the emergency department for admission.   [JS]    Clinical Course User Index [JS] Irean Hong, MD     ____________________________________________   FINAL CLINICAL IMPRESSION(S) / ED DIAGNOSES  Final diagnoses:  Fall, initial encounter  Right hip pain  Closed fracture of right hip, initial encounter [facility])  Hyponatremia     ED Discharge Orders    None       Note:  This document was prepared using Dragon voice recognition software and may include unintentional dictation errors.    Irean Hong, MD 10/03/18 [id number]

## 2018-10-02 NOTE — ED Triage Notes (Addendum)
Pt to ED from home via EMS c/o mechanical fall tonight over a rug and fell onto right side and on the ground approx 45 minutes, pain from right knee to right hip, denies hitting head or LOC, states 81mg  ASA daily.  Presents A&Ox4, right leg rotation and shortening.  EMS vitals 85 HR, 95% RA, 129/101.  Hx of left hip replacement and right knee replacement.

## 2018-10-02 NOTE — ED Notes (Signed)
Report given to Saretta RN.

## 2018-10-03 ENCOUNTER — Emergency Department: Payer: Medicare Other

## 2018-10-03 ENCOUNTER — Encounter: Payer: Self-pay | Admitting: Anesthesiology

## 2018-10-03 DIAGNOSIS — S72001A Fracture of unspecified part of neck of right femur, initial encounter for closed fracture: Secondary | ICD-10-CM | POA: Diagnosis present

## 2018-10-03 DIAGNOSIS — M25551 Pain in right hip: Secondary | ICD-10-CM | POA: Diagnosis present

## 2018-10-03 DIAGNOSIS — F319 Bipolar disorder, unspecified: Secondary | ICD-10-CM | POA: Diagnosis present

## 2018-10-03 DIAGNOSIS — Z6837 Body mass index (BMI) 37.0-37.9, adult: Secondary | ICD-10-CM | POA: Diagnosis not present

## 2018-10-03 DIAGNOSIS — I2510 Atherosclerotic heart disease of native coronary artery without angina pectoris: Secondary | ICD-10-CM | POA: Diagnosis present

## 2018-10-03 DIAGNOSIS — Z8619 Personal history of other infectious and parasitic diseases: Secondary | ICD-10-CM | POA: Diagnosis not present

## 2018-10-03 DIAGNOSIS — Y92009 Unspecified place in unspecified non-institutional (private) residence as the place of occurrence of the external cause: Secondary | ICD-10-CM | POA: Diagnosis not present

## 2018-10-03 DIAGNOSIS — I10 Essential (primary) hypertension: Secondary | ICD-10-CM | POA: Diagnosis present

## 2018-10-03 DIAGNOSIS — E669 Obesity, unspecified: Secondary | ICD-10-CM | POA: Diagnosis present

## 2018-10-03 DIAGNOSIS — J449 Chronic obstructive pulmonary disease, unspecified: Secondary | ICD-10-CM | POA: Diagnosis present

## 2018-10-03 DIAGNOSIS — R809 Proteinuria, unspecified: Secondary | ICD-10-CM | POA: Diagnosis present

## 2018-10-03 DIAGNOSIS — M21151 Varus deformity, not elsewhere classified, right hip: Secondary | ICD-10-CM | POA: Diagnosis present

## 2018-10-03 DIAGNOSIS — M25511 Pain in right shoulder: Secondary | ICD-10-CM | POA: Diagnosis present

## 2018-10-03 DIAGNOSIS — Z86711 Personal history of pulmonary embolism: Secondary | ICD-10-CM | POA: Diagnosis not present

## 2018-10-03 DIAGNOSIS — G894 Chronic pain syndrome: Secondary | ICD-10-CM | POA: Diagnosis present

## 2018-10-03 DIAGNOSIS — Z96642 Presence of left artificial hip joint: Secondary | ICD-10-CM | POA: Diagnosis present

## 2018-10-03 DIAGNOSIS — M5030 Other cervical disc degeneration, unspecified cervical region: Secondary | ICD-10-CM | POA: Diagnosis present

## 2018-10-03 DIAGNOSIS — F4310 Post-traumatic stress disorder, unspecified: Secondary | ICD-10-CM | POA: Diagnosis present

## 2018-10-03 DIAGNOSIS — E1165 Type 2 diabetes mellitus with hyperglycemia: Secondary | ICD-10-CM | POA: Diagnosis present

## 2018-10-03 DIAGNOSIS — Z86718 Personal history of other venous thrombosis and embolism: Secondary | ICD-10-CM | POA: Diagnosis not present

## 2018-10-03 DIAGNOSIS — M545 Low back pain: Secondary | ICD-10-CM | POA: Diagnosis present

## 2018-10-03 DIAGNOSIS — W010XXA Fall on same level from slipping, tripping and stumbling without subsequent striking against object, initial encounter: Secondary | ICD-10-CM | POA: Diagnosis present

## 2018-10-03 DIAGNOSIS — E1129 Type 2 diabetes mellitus with other diabetic kidney complication: Secondary | ICD-10-CM | POA: Diagnosis present

## 2018-10-03 DIAGNOSIS — F429 Obsessive-compulsive disorder, unspecified: Secondary | ICD-10-CM | POA: Diagnosis present

## 2018-10-03 DIAGNOSIS — M810 Age-related osteoporosis without current pathological fracture: Secondary | ICD-10-CM | POA: Diagnosis present

## 2018-10-03 DIAGNOSIS — F411 Generalized anxiety disorder: Secondary | ICD-10-CM | POA: Diagnosis present

## 2018-10-03 DIAGNOSIS — E871 Hypo-osmolality and hyponatremia: Secondary | ICD-10-CM | POA: Diagnosis present

## 2018-10-03 LAB — BASIC METABOLIC PANEL
Anion gap: 7 (ref 5–15)
Anion gap: 9 (ref 5–15)
BUN: 13 mg/dL (ref 8–23)
BUN: 9 mg/dL (ref 8–23)
CO2: 31 mmol/L (ref 22–32)
CO2: 27 mmol/L (ref 22–32)
CREATININE: 0.58 mg/dL (ref 0.44–1.00)
Calcium: 8.3 mg/dL — ABNORMAL LOW (ref 8.9–10.3)
Calcium: 8 mg/dL — ABNORMAL LOW (ref 8.9–10.3)
Chloride: 86 mmol/L — ABNORMAL LOW (ref 98–111)
Chloride: 86 mmol/L — ABNORMAL LOW (ref 98–111)
Creatinine, Ser: 0.55 mg/dL (ref 0.44–1.00)
GFR calc Af Amer: >60 mL/min (ref >60)
GFR calc non Af Amer: >60 mL/min (ref >60)
GFR calc non Af Amer: >60 mL/min (ref >60)
Glucose, Bld: 156 mg/dL — ABNORMAL HIGH (ref 70–99)
Glucose, Bld: 164 mg/dL — ABNORMAL HIGH (ref 70–99)
Potassium: 3.9 mmol/L (ref 3.5–5.1)
Potassium: 3.7 mmol/L (ref 3.5–5.1)
Sodium: 124 mmol/L — ABNORMAL LOW (ref 135–145)
Sodium: 122 mmol/L — ABNORMAL LOW (ref 135–145)

## 2018-10-03 LAB — GLUCOSE, CAPILLARY
Glucose-Capillary: 144 mg/dL — ABNORMAL HIGH (ref 70–99)
Glucose-Capillary: 167 mg/dL — ABNORMAL HIGH (ref 70–99)
Glucose-Capillary: 159 mg/dL — ABNORMAL HIGH (ref 70–99)
Glucose-Capillary: 168 mg/dL — ABNORMAL HIGH (ref 70–99)
Glucose-Capillary: 147 mg/dL — ABNORMAL HIGH (ref 70–99)

## 2018-10-03 LAB — CBC
HCT: 38.1 % (ref 36.0–46.0)
Hemoglobin: 12.7 g/dL (ref 12.0–15.0)
MCH: 31 pg (ref 26.0–34.0)
MCHC: 33.3 g/dL (ref 30.0–36.0)
MCV: 92.9 fL (ref 80.0–100.0)
Platelets: 275 10*3/uL (ref 150–400)
RBC: 4.1 MIL/uL (ref 3.87–5.11)
RDW: 12.2 % (ref 11.5–15.5)
WBC: 11.5 10*3/uL — ABNORMAL HIGH (ref 4.0–10.5)
nRBC: 0 % (ref 0.0–0.2)

## 2018-10-03 LAB — HEMOGLOBIN A1C
Hgb A1c MFr Bld: 5.9 % — ABNORMAL HIGH (ref 4.8–5.6)
Mean Plasma Glucose: 122.63 mg/dL

## 2018-10-03 LAB — SODIUM
SODIUM: 119 mmol/L — AB (ref 135–145)
Sodium: 121 mmol/L — ABNORMAL LOW (ref 135–145)
Sodium: 120 mmol/L — ABNORMAL LOW (ref 135–145)

## 2018-10-03 LAB — SODIUM, URINE, RANDOM: Sodium, Ur: 13 mmol/L

## 2018-10-03 LAB — SURGICAL PCR SCREEN
MRSA, PCR: NEGATIVE
Staphylococcus aureus: NEGATIVE

## 2018-10-03 LAB — OSMOLALITY, URINE: Osmolality, Ur: 265 mOsm/kg — ABNORMAL LOW (ref 300–900)

## 2018-10-03 MED ORDER — SODIUM CHLORIDE 0.9 % IV SOLN
INTRAVENOUS | Status: DC
  Administered 2018-10-03: 02:00:00 via INTRAVENOUS

## 2018-10-03 MED ORDER — DULOXETINE HCL 60 MG PO CPEP
120.0000 mg | ORAL_CAPSULE | Freq: Every day | ORAL | Status: DC
  Administered 2018-10-03 – 2018-10-08 (×5): 120 mg via ORAL
  Filled 2018-10-03 (×6): qty 2

## 2018-10-03 MED ORDER — TRAZODONE HCL 100 MG PO TABS
100.0000 mg | ORAL_TABLET | Freq: Every day | ORAL | Status: DC
  Administered 2018-10-03 – 2018-10-07 (×4): 100 mg via ORAL
  Filled 2018-10-03 (×4): qty 1

## 2018-10-03 MED ORDER — VITAMIN B-12 1000 MCG PO TABS
5000.0000 ug | ORAL_TABLET | Freq: Every day | ORAL | Status: DC
  Administered 2018-10-03 – 2018-10-07 (×3): 5000 ug via ORAL
  Filled 2018-10-03 (×3): qty 5

## 2018-10-03 MED ORDER — INSULIN ASPART 100 UNIT/ML ~~LOC~~ SOLN: 0.0000 [IU] | Freq: Every day | SUBCUTANEOUS | Status: DC

## 2018-10-03 MED ORDER — OXYCODONE HCL ER 10 MG PO T12A
20.0000 mg | EXTENDED_RELEASE_TABLET | Freq: Two times a day (BID) | ORAL | Status: DC
  Administered 2018-10-03: 20 mg via ORAL
  Filled 2018-10-03: qty 2

## 2018-10-03 MED ORDER — FLUTICASONE PROPIONATE 50 MCG/ACT NA SUSP
1.0000 | Freq: Two times a day (BID) | NASAL | Status: DC
  Administered 2018-10-08: 1 via NASAL
  Filled 2018-10-03 (×2): qty 16

## 2018-10-03 MED ORDER — ASPIRIN EC 325 MG PO TBEC: 325.0000 mg | DELAYED_RELEASE_TABLET | Freq: Every day | ORAL | Status: DC

## 2018-10-03 MED ORDER — ACETAMINOPHEN 650 MG RE SUPP: 650.0000 mg | Freq: Four times a day (QID) | RECTAL | Status: DC | PRN

## 2018-10-03 MED ORDER — POLYETHYLENE GLYCOL 3350 17 G PO PACK: 17.0000 g | PACK | Freq: Every day | ORAL | Status: DC | PRN

## 2018-10-03 MED ORDER — OXYCODONE HCL ER 10 MG PO T12A
20.0000 mg | EXTENDED_RELEASE_TABLET | Freq: Two times a day (BID) | ORAL | Status: DC
  Administered 2018-10-03 – 2018-10-07 (×7): 20 mg via ORAL
  Filled 2018-10-03 (×8): qty 2

## 2018-10-03 MED ORDER — CLONAZEPAM 0.5 MG PO TABS
0.5000 mg | ORAL_TABLET | Freq: Two times a day (BID) | ORAL | Status: DC
  Administered 2018-10-03 – 2018-10-08 (×8): 0.5 mg via ORAL
  Filled 2018-10-03 (×9): qty 1

## 2018-10-03 MED ORDER — ONDANSETRON HCL 4 MG/2ML IJ SOLN: 4.0000 mg | Freq: Four times a day (QID) | INTRAMUSCULAR | Status: DC | PRN

## 2018-10-03 MED ORDER — ONDANSETRON HCL 4 MG PO TABS: 4.0000 mg | ORAL_TABLET | Freq: Four times a day (QID) | ORAL | Status: DC | PRN

## 2018-10-03 MED ORDER — HYDROMORPHONE HCL 1 MG/ML IJ SOLN
INTRAMUSCULAR | Status: AC
  Filled 2018-10-03: qty 1

## 2018-10-03 MED ORDER — GABAPENTIN 400 MG PO CAPS
1200.0000 mg | ORAL_CAPSULE | Freq: Three times a day (TID) | ORAL | Status: DC
  Administered 2018-10-03 – 2018-10-07 (×9): 1200 mg via ORAL
  Filled 2018-10-03 (×9): qty 3

## 2018-10-03 MED ORDER — HYDROMORPHONE HCL 1 MG/ML IJ SOLN
0.5000 mg | INTRAMUSCULAR | Status: DC | PRN
  Administered 2018-10-03 (×3): 0.5 mg via INTRAVENOUS
  Filled 2018-10-03 (×3): qty 1

## 2018-10-03 MED ORDER — IRBESARTAN 150 MG PO TABS
300.0000 mg | ORAL_TABLET | Freq: Every day | ORAL | Status: DC
  Administered 2018-10-07: 300 mg via ORAL
  Filled 2018-10-03 (×4): qty 2

## 2018-10-03 MED ORDER — HYDROMORPHONE HCL 1 MG/ML IJ SOLN
0.5000 mg | INTRAMUSCULAR | Status: DC | PRN
  Administered 2018-10-03: 0.5 mg via INTRAVENOUS

## 2018-10-03 MED ORDER — OLANZAPINE 5 MG PO TABS
2.5000 mg | ORAL_TABLET | Freq: Every day | ORAL | Status: DC
  Administered 2018-10-03 – 2018-10-05 (×3): 2.5 mg via ORAL
  Filled 2018-10-03 (×3): qty 1

## 2018-10-03 MED ORDER — HEPARIN SODIUM (PORCINE) 5000 UNIT/ML IJ SOLN: 5000.0000 [IU] | Freq: Three times a day (TID) | INTRAMUSCULAR | Status: DC

## 2018-10-03 MED ORDER — INSULIN ASPART 100 UNIT/ML ~~LOC~~ SOLN
0.0000 [IU] | Freq: Three times a day (TID) | SUBCUTANEOUS | Status: DC
  Administered 2018-10-03 – 2018-10-04 (×5): 2 [IU] via SUBCUTANEOUS
  Administered 2018-10-04 – 2018-10-05 (×2): 1 [IU] via SUBCUTANEOUS
  Administered 2018-10-05 – 2018-10-06 (×2): 2 [IU] via SUBCUTANEOUS
  Administered 2018-10-06: 1 [IU] via SUBCUTANEOUS
  Administered 2018-10-07 – 2018-10-08 (×5): 2 [IU] via SUBCUTANEOUS
  Filled 2018-10-03 (×15): qty 1

## 2018-10-03 MED ORDER — ACETAMINOPHEN 325 MG PO TABS
650.0000 mg | ORAL_TABLET | Freq: Four times a day (QID) | ORAL | Status: DC | PRN
  Administered 2018-10-05 – 2018-10-06 (×3): 650 mg via ORAL
  Filled 2018-10-03 (×3): qty 2

## 2018-10-03 MED ORDER — HYDROMORPHONE HCL 1 MG/ML IJ SOLN
1.0000 mg | INTRAMUSCULAR | Status: DC | PRN
  Administered 2018-10-03 – 2018-10-04 (×4): 1 mg via INTRAVENOUS
  Filled 2018-10-03 (×4): qty 1

## 2018-10-03 MED ORDER — CARVEDILOL 12.5 MG PO TABS
12.5000 mg | ORAL_TABLET | Freq: Two times a day (BID) | ORAL | Status: DC
  Administered 2018-10-03 – 2018-10-08 (×11): 12.5 mg via ORAL
  Filled 2018-10-03 (×11): qty 1

## 2018-10-03 MED ORDER — DIVALPROEX SODIUM ER 500 MG PO TB24
1500.0000 mg | ORAL_TABLET | Freq: Every day | ORAL | Status: DC
  Administered 2018-10-03 – 2018-10-08 (×5): 1500 mg via ORAL
  Filled 2018-10-03 (×6): qty 3

## 2018-10-03 MED ORDER — MELATONIN 5 MG PO TABS
5.0000 mg | ORAL_TABLET | Freq: Every evening | ORAL | Status: DC | PRN
  Filled 2018-10-03: qty 1

## 2018-10-03 MED ORDER — ORAL CARE MOUTH RINSE
15.0000 mL | Freq: Two times a day (BID) | OROMUCOSAL | Status: DC
  Administered 2018-10-07: 15 mL via OROMUCOSAL

## 2018-10-03 MED ORDER — DIPHENHYDRAMINE HCL 25 MG PO CAPS
50.0000 mg | ORAL_CAPSULE | Freq: Every day | ORAL | Status: DC
  Administered 2018-10-03 – 2018-10-05 (×3): 50 mg via ORAL
  Filled 2018-10-03 (×3): qty 2

## 2018-10-03 MED ORDER — SODIUM CHLORIDE 1 G PO TABS
2.0000 g | ORAL_TABLET | Freq: Three times a day (TID) | ORAL | Status: DC
  Administered 2018-10-03 – 2018-10-04 (×2): 2 g via ORAL
  Filled 2018-10-03 (×4): qty 2

## 2018-10-03 MED ORDER — BACLOFEN 10 MG PO TABS
5.0000 mg | ORAL_TABLET | Freq: Two times a day (BID) | ORAL | Status: DC
  Administered 2018-10-03 – 2018-10-08 (×8): 5 mg via ORAL
  Filled 2018-10-03 (×12): qty 0.5

## 2018-10-03 MED ORDER — HYDRALAZINE HCL 20 MG/ML IJ SOLN
5.0000 mg | Freq: Four times a day (QID) | INTRAMUSCULAR | Status: DC | PRN
  Filled 2018-10-03: qty 1

## 2018-10-03 MED ORDER — FERROUS SULFATE 325 (65 FE) MG PO TABS
650.0000 mg | ORAL_TABLET | Freq: Every day | ORAL | Status: DC
  Administered 2018-10-03 – 2018-10-07 (×3): 650 mg via ORAL
  Filled 2018-10-03 (×3): qty 2

## 2018-10-03 MED ORDER — OXYCODONE HCL 5 MG PO TABS
15.0000 mg | ORAL_TABLET | Freq: Four times a day (QID) | ORAL | Status: DC | PRN
  Administered 2018-10-03 – 2018-10-07 (×12): 15 mg via ORAL
  Filled 2018-10-03 (×12): qty 3

## 2018-10-03 MED ORDER — CEFAZOLIN SODIUM-DEXTROSE 2-4 GM/100ML-% IV SOLN
2.0000 g | INTRAVENOUS | Status: AC
  Filled 2018-10-03: qty 100

## 2018-10-03 MED ORDER — ALBUTEROL SULFATE (2.5 MG/3ML) 0.083% IN NEBU: 2.5000 mg | INHALATION_SOLUTION | Freq: Four times a day (QID) | RESPIRATORY_TRACT | Status: DC | PRN

## 2018-10-03 NOTE — ED Notes (Signed)
ED TO INPATIENT HANDOFF REPORT  Name/Age/Gender Amy Sweeney 68 y.o. female  Code Status Code Status History    Date Active Date Inactive Code Status Order ID Comments User Context   04/01/2017 1716 04/03/2017 1455 Full Code [id number]  Oralia Manis, MD Inpatient   07/30/2016 1159 08/03/2016 1523 Full Code [id number]  Kennedy Bucker, MD Inpatient   05/30/2015 1454 06/02/2015 1740 Full Code [id number]  Kennedy Bucker, MD Inpatient    Advance Directive Documentation     Most Recent Value  Type of Advance Directive  Healthcare Power of Attorney, Living will  Pre-existing out of facility DNR order (yellow form or pink MOST form)  -  "MOST" Form in Place?  -        Chief Complaint Fall  Level of Care/Admitting Diagnosis ED Disposition    ED Disposition Condition Comment   Admit  Hospital Area: [facility] [[id number]]  Level of Care: Med-Surg [16]  Diagnosis: Closed right hip fracture [facility]) [[id number]]  Admitting Physician: Willadean Carol DODD [[id number]]  Attending Physician: Willadean Carol DODD [[id number]]  Estimated length of stay: 3 - 4 days  Certification:: I certify this patient will need inpatient services for at least 2 midnights  PT Class (Do Not Modify): Inpatient [101]  PT Acc Code (Do Not Modify): Private [1]       Medical History Past Medical History:  Diagnosis Date  . Anxiety   . Arthritis    degenerative artgritis  osteoarthritis  . Bipolar 1 disorder (HCC)   . Bipolar disorder (HCC)   . COPD (chronic obstructive pulmonary disease) (HCC)    hx pulmonary embolism  . Coronary artery disease   . DDD (degenerative disc disease), cervical   . DDD (degenerative disc disease), cervical   . Depression   . Diabetes mellitus without complication (HCC)    Pt takes metformin.   Marland Kitchen Dyspnea   . Family history of adverse reaction to anesthesia    father blood clot after surgery died  . Headache    history of migraines  . History of pulmonary embolus (PE)    . History of shingles   . Hypertension   . Lower extremity deep venous thrombosis (HCC)   . Lower extremity edema   . 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 Night terrors   . OCD (obsessive compulsive disorder)   . Osteoarthritis   . Osteoporosis   . PTSD (post-traumatic stress disorder)   . Vitamin B 12 deficiency     Allergies Allergies  Allergen Reactions  . Bee Venom Anaphylaxis  . Morphine And Related Anaphylaxis    Blisters itching, N&V, face and throat swelling  . Adhesive [Tape] Other (See Comments)    Itching and water blisters. Paper tape is OK.  . Celebrex [Celecoxib] Swelling  . Tegaderm Ag Mesh [Silver] Other (See Comments)    itching and water blisters itching and water blisters  . Grape Seed Itching and Swelling    Lips and mouth swelling/itching.  Grapes not grape seed  . Latuda [Lurasidone Hcl] Other (See Comments)  . Tapentadol Other (See Comments)    IV Location/Drains/Wounds Patient Lines/Drains/Airways Status   Active Line/Drains/Airways    Name:   Placement date:   Placement time:   Site:   Days:   Peripheral IV 10/02/18 Left Forearm   10/02/18    2251    Forearm   1   Urethral Catheter Lurena Joiner RN Latex  14 Fr.   10/03/18    0002    Latex   less than 1   Airway   04/01/18    1049     185          Labs/Imaging Results for orders placed or performed during the hospital encounter of 10/02/18 (from the past 48 hour(s))  CBC with Differential     Status: None   Collection Time: 10/02/18 10:52 PM  Result Value Ref Range   WBC 7.5 4.0 - 10.5 K/uL   RBC 4.16 3.87 - 5.11 MIL/uL   Hemoglobin 12.8 12.0 - 15.0 g/dL   HCT 81.137.7 91.436.0 - 78.246.0 %   MCV 90.6 80.0 - 100.0 fL   MCH 30.8 26.0 - 34.0 pg   MCHC 34.0 30.0 - 36.0 g/dL   RDW 95.612.1 21.311.5 - 08.615.5 %   Platelets 276 150 - 400 K/uL   nRBC 0.0 0.0 - 0.2 %   Neutrophils Relative % 59 %   Neutro Abs 4.4 1.7 - 7.7 K/uL    Lymphocytes Relative 34 %   Lymphs Abs 2.6 0.7 - 4.0 K/uL   Monocytes Relative 7 %   Monocytes Absolute 0.5 0.1 - 1.0 K/uL   Eosinophils Relative 0 %   Eosinophils Absolute 0.0 0.0 - 0.5 K/uL   Basophils Relative 0 %   Basophils Absolute 0.0 0.0 - 0.1 K/uL   Immature Granulocytes 0 %   Abs Immature Granulocytes 0.03 0.00 - 0.07 K/uL    Comment: Performed at [facility], [address] [place], [place] [address]  Basic metabolic panel     Status: Abnormal   Collection Time: 10/02/18 10:52 PM  Result Value Ref Range   Sodium 121 (L) 135 - 145 mmol/L   Potassium 4.3 3.5 - 5.1 mmol/L   Chloride 85 (L) 98 - 111 mmol/L   CO2 26 22 - 32 mmol/L   Glucose, Bld 145 (H) 70 - 99 mg/dL   BUN 13 8 - 23 mg/dL   Creatinine, Ser 9.620.73 0.44 - 1.00 mg/dL   Calcium 8.4 (L) 8.9 - 10.3 mg/dL   GFR calc non Af Amer >60 >60 mL/min   GFR calc Af Amer >60 >60 mL/min   Anion gap 10 5 - 15    Comment: Performed at [facility], [address] [place], [place] [address]  Troponin I - Once     Status: None   Collection Time: 10/02/18 10:52 PM  Result Value Ref Range   Troponin I <0.03 <0.03 ng/mL    Comment: Performed at [facility], [address] [place], [place] [address]   Dg Chest 1 View  Result Date: 10/02/2018 CLINICAL DATA:  c/o mechanical fall tonight over a rug and fell onto right side and on the ground approx 45 minutes, pain from right knee to right hip, denies hitting head or LOCpre-op EXAM: CHEST  1 VIEW COMPARISON:  None. FINDINGS: Normal cardiac silhouette. Low lung volumes. No effusion, infiltrate or pneumothorax. IMPRESSION: Low lung volumes.  No acute findings. Electronically Signed   By: Genevive BiStewart  Edmunds M.D.   On: 10/02/2018 23:35   Dg Hip Unilat  With Pelvis 2-3 Views Right  Result Date: 10/02/2018 CLINICAL DATA:  68 year old female with fall and right hip pain. EXAM: DG HIP (WITH OR WITHOUT PELVIS) 2-3V RIGHT COMPARISON:  CT of the abdomen pelvis dated  05/05/2018 FINDINGS: There is a mildly displaced fracture of the right femoral neck with mild varus angulation. No  dislocation. There is advanced osteopenia. There is a left hip arthroplasty. The soft tissues are grossly unremarkable. IMPRESSION: Right femoral neck fracture. Electronically Signed   By: Elgie Collard M.D.   On: 10/02/2018 23:34    Pending Labs [employer] (From admission, onward)    Start     Ordered   Signed and Held  HIV antibody (Routine Testing)  Once,   R     Signed and Held   Signed and Armed forces training and education officer morning,   R     Signed and Held   Signed and Held  CBC  Tomorrow morning,   R     Signed and Held   Signed and Held  Sodium, urine, random  Once,   R     Signed and Held   Signed and Held  Osmolality, urine  Once,   R     Signed and Held          Vitals/Pain Today's Vitals   10/02/18 2247 10/02/18 2330 10/03/18 0000 10/03/18 0002  BP: 131/84 122/78 (!) 149/97   Pulse: 81 78 77   Resp: 18 11 16    Temp: 98.2 F (36.8 C)     TempSrc: Oral     SpO2: 93% 92% 94%   Weight: 110.7 kg     Height: 5\' 8"  (1.727 m)     PainSc: 10-Worst pain ever   8     Isolation Precautions No active isolations  Medications Medications  HYDROmorphone (DILAUDID) injection 0.5 mg (0.5 mg Intravenous Given 10/03/18 0031)  HYDROmorphone (DILAUDID) 1 MG/ML injection (has no administration in time range)  fentaNYL (SUBLIMAZE) injection 50 mcg (50 mcg Intravenous Given 10/02/18 2327)

## 2018-10-03 NOTE — Progress Notes (Signed)
[facility] [place] [facility]   PATIENT NAME: Amy Sweeney    MR#:  [id number]  DATE OF BIRTH:  February 19, 1951  SUBJECTIVE:  CHIEF COMPLAINT:   Chief Complaint  Patient presents with  . Fall  . Hip Pain   -Complains of significant pain.  Came in after a fall and right hip fracture.  REVIEW OF SYSTEMS:  Review of Systems  Constitutional: Negative for chills, fever and malaise/fatigue.  HENT: Negative for congestion, ear discharge, hearing loss and nosebleeds.   Eyes: Negative for blurred vision and double vision.  Respiratory: Negative for cough, shortness of breath and wheezing.   Cardiovascular: Negative for chest pain and palpitations.  Gastrointestinal: Negative for abdominal pain, constipation, diarrhea, nausea and vomiting.  Genitourinary: Negative for dysuria.  Musculoskeletal: Positive for back pain, falls, joint pain and myalgias.  Neurological: Negative for dizziness, focal weakness, seizures, weakness and headaches.  Psychiatric/Behavioral: Negative for depression.    DRUG ALLERGIES:   Allergies  Allergen Reactions  . Bee Venom Anaphylaxis  . Morphine And Related Anaphylaxis    Blisters itching, N&V, face and throat swelling  . Adhesive [Tape] Other (See Comments)    Itching and water blisters. Paper tape is OK.  . Celebrex [Celecoxib] Swelling  . Tegaderm Ag Mesh [Silver] Other (See Comments)    itching and water blisters itching and water blisters  . Grape Seed Itching and Swelling    Lips and mouth swelling/itching.  Grapes not grape seed  . Latuda [Lurasidone Hcl] Other (See Comments)  . Tapentadol Other (See Comments)    VITALS:  Blood pressure (!) 181/97, pulse 90, temperature 99 F (37.2 C), temperature source Oral, resp. rate 20, height 5\' 8"  (1.727 m), weight 110.7 kg, SpO2 95 %.  PHYSICAL EXAMINATION:  Physical Exam   GENERAL:  68 y.o.-year-old obese patient lying in the bed, appears to be in significant distress  secondary to pain EYES: Pupils equal, round, reactive to light and accommodation. No scleral icterus. Extraocular muscles intact.  HEENT: Head atraumatic, normocephalic. Oropharynx and nasopharynx clear.  NECK:  Supple, no jugular venous distention. No thyroid enlargement, no tenderness.  LUNGS: Normal breath sounds bilaterally, no wheezing, rales,rhonchi or crepitation. No use of accessory muscles of respiration.  Decreased bibasilar breath sounds CARDIOVASCULAR: S1, S2 normal. No murmurs, rubs, or gallops.  ABDOMEN: Soft, obese, nontender, nondistended. Bowel sounds present. No organomegaly or mass.  EXTREMITIES: Right leg is abducted and externally rotated.  No pedal edema, cyanosis, or clubbing.  NEUROLOGIC: Cranial nerves II through XII are intact. Muscle strength 5/5 in all extremities. Sensation intact. Gait not checked.  PSYCHIATRIC: The patient is alert and oriented x 3.  SKIN: No obvious rash, lesion, or ulcer.    LABORATORY PANEL:   CBC Recent Labs  Lab 10/03/18 0253  WBC 11.5*  HGB 12.7  HCT 38.1  PLT 275   ------------------------------------------------------------------------------------------------------------------  Chemistries  Recent Labs  Lab 10/03/18 0945  NA 122*  K 3.7  CL 86*  CO2 27  GLUCOSE 164*  BUN 9  CREATININE 0.55  CALCIUM 8.0*   ------------------------------------------------------------------------------------------------------------------  Cardiac Enzymes Recent Labs  Lab 10/02/18 2252  TROPONINI <0.03   ------------------------------------------------------------------------------------------------------------------  RADIOLOGY:  Dg Chest 1 View  Result Date: 10/02/2018 CLINICAL DATA:  c/o mechanical fall tonight over a rug and fell onto right side and on the ground approx 45 minutes, pain from right knee to right hip, denies hitting head or LOCpre-op EXAM: CHEST  1 VIEW COMPARISON:  None.  FINDINGS: Normal cardiac silhouette. Low  lung volumes. No effusion, infiltrate or pneumothorax. IMPRESSION: Low lung volumes.  No acute findings. Electronically Signed   By: Genevive Bi M.D.   On: 10/02/2018 23:35   Dg Elbow 2 Views Right  Result Date: 10/03/2018 CLINICAL DATA:  68 year old female with fall and right elbow pain. EXAM: RIGHT ELBOW - 2 VIEW COMPARISON:  None. FINDINGS: There is no acute fracture or dislocation. There is mild osteopenia. No joint effusion. The soft tissues are unremarkable. IMPRESSION: No acute fracture or dislocation. Electronically Signed   By: Elgie Collard M.D.   On: 10/03/2018 01:19   Dg Hip Unilat  With Pelvis 2-3 Views Right  Result Date: 10/02/2018 CLINICAL DATA:  68 year old female with fall and right hip pain. EXAM: DG HIP (WITH OR WITHOUT PELVIS) 2-3V RIGHT COMPARISON:  CT of the abdomen pelvis dated 05/05/2018 FINDINGS: There is a mildly displaced fracture of the right femoral neck with mild varus angulation. No dislocation. There is advanced osteopenia. There is a left hip arthroplasty. The soft tissues are grossly unremarkable. IMPRESSION: Right femoral neck fracture. Electronically Signed   By: Elgie Collard M.D.   On: 10/02/2018 23:34    EKG:   Orders placed or performed during the hospital encounter of 10/02/18  . EKG 12-Lead  . EKG 12-Lead  . ED EKG  . ED EKG    ASSESSMENT AND PLAN:   68 year old female with multiple medical problems including degenerative disc disease, chronic back pain following with pain management clinic, COPD, CAD, bipolar with depression and anxiety presents to hospital secondary to fall and right hip pain.  1.  Mechanical fall and right femoral neck fracture-complains of significant pain -Takes chronic pain medications at home and so tolerant to higher pain medication doses -Awaiting Ortho consult.  Possible surgery once sodium is improved -Adjust medications for better pain control -Physical therapy after surgery  2.  Hyponatremia-received IV  fluids for possible dehydration, however sodium still low.  Could be SIADH driven by her pain -Discontinue IV fluids and fluid restriction today.  Repeat sodium check every 8 hours -If no improvement, consider nephrology consult  3.  Chronic low back pain-follows with [facility] pain management clinic.  Restarted her OxyContin and oxycodone. -Continue other pain medications and muscle relaxants  4.  Hypertension-on Avapro and Coreg  5.  DVT prophylaxis-will be started after surgery     All the records are reviewed and case discussed with Care Management/Social Workerr. Management plans discussed with the patient, family and they are in agreement.  CODE STATUS: Full Code  TOTAL TIME TAKING CARE OF THIS PATIENT: 38 minutes.   POSSIBLE D/C IN 3 DAYS, DEPENDING ON CLINICAL CONDITION.   Enid Baas M.D on 10/03/2018 at 10:51 AM  Between 7am to 6pm - Pager - [phone]  After 6pm go to [email or web address] - Social research officer, government  [facility]  Office  [phone]  CC: Primary care physician; Barbette Reichmann, MD

## 2018-10-03 NOTE — Anesthesia Preprocedure Evaluation (Deleted)
Anesthesia Evaluation    Airway        Dental   Pulmonary           Cardiovascular hypertension,      Neuro/Psych    GI/Hepatic   Endo/Other  diabetes  Renal/GU      Musculoskeletal   Abdominal   Peds  Hematology   Anesthesia Other Findings Past Medical History: No date: Anxiety No date: Arthritis     Comment:  degenerative artgritis  osteoarthritis No date: Bipolar 1 disorder (HCC) No date: Bipolar disorder (HCC) No date: COPD (chronic obstructive pulmonary disease) (HCC)     Comment:  hx pulmonary embolism No date: Coronary artery disease No date: DDD (degenerative disc disease), cervical No date: DDD (degenerative disc disease), cervical No date: Depression No date: Diabetes mellitus without complication (HCC)     Comment:  Pt takes metformin.  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, subsequent encounter No date: Night terrors No date: OCD (obsessive compulsive disorder) No date: Osteoarthritis No date: Osteoporosis No date: PTSD (post-traumatic stress disorder) No date: Vitamin B 12 deficiency   Reproductive/Obstetrics                             Anesthesia Physical Anesthesia Plan  ASA:   Anesthesia Plan:    Post-op Pain Management:    Induction:   PONV Risk Score and Plan:   Airway Management Planned:   Additional Equipment:   Intra-op Plan:   Post-operative Plan:   Informed Consent:   Plan Discussed with:   Anesthesia Plan Comments: (We  would like serum Na greater than 125 )        Anesthesia Quick Evaluation

## 2018-10-03 NOTE — Consult Note (Addendum)
CC: R hip pain HPI: 68 y/o F s/p mechanical GLF w/ R hip pain. Pt unable to ambulate. Pt denies numbness/tingling. Denies L hip pain. Has some R shoulder pain o/w no other reported injury. No LOC. No head injury. Pt has known osteoporosis on Prolia. Hx of DVT, no longer on anticoagulation.   Review of Systems  Constitutional: Negative for chills and weight loss.  HENT: Negative for hearing loss.   Eyes: Negative for blurred vision.  Respiratory: Negative for cough.   Cardiovascular: Negative for chest pain and palpitations.  Gastrointestinal: Negative for abdominal pain.  Genitourinary: Negative for flank pain.  Musculoskeletal: Negative for neck pain.  Skin: Negative for rash.  Neurological: Negative for dizziness, loss of consciousness and headaches.    Past Medical History:  Diagnosis Date  . Anxiety   . Arthritis    degenerative artgritis  osteoarthritis  . Bipolar 1 disorder (HCC)   . Bipolar disorder (HCC)   . COPD (chronic obstructive pulmonary disease) (HCC)    hx pulmonary embolism  . Coronary artery disease   . DDD (degenerative disc disease), cervical   . DDD (degenerative disc disease), cervical   . Depression   . Diabetes mellitus without complication (HCC)    Pt takes metformin.   Marland Kitchen Dyspnea   . Family history of adverse reaction to anesthesia    father blood clot after surgery died  . Headache    history of migraines  . History of pulmonary embolus (PE)   . History of shingles   . Hypertension   . Lower extremity deep venous thrombosis (HCC)   . Lower extremity edema   . 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 Night terrors   . OCD (obsessive compulsive disorder)   . Osteoarthritis   . Osteoporosis   . PTSD (post-traumatic stress disorder)   . Vitamin B 12 deficiency    Past Surgical History:  Procedure Laterality Date  . CARDIAC CATHETERIZATION  2004  . CERVICAL  LAMINOPLASTY    . CESAREAN SECTION     x2  . CHOLECYSTECTOMY    . COLONOSCOPY WITH PROPOFOL N/A 04/01/2018   Procedure: COLONOSCOPY WITH PROPOFOL;  Surgeon: Christena Deem, MD;  Location: [facility] ENDOSCOPY;  Service: Endoscopy;  Laterality: N/A;  . DILATION AND CURETTAGE OF UTERUS    . ESOPHAGOGASTRODUODENOSCOPY (EGD) WITH PROPOFOL N/A 04/01/2018   Procedure: ESOPHAGOGASTRODUODENOSCOPY (EGD) WITH PROPOFOL;  Surgeon: Christena Deem, MD;  Location: [facility] ENDOSCOPY;  Service: Endoscopy;  Laterality: N/A;  . FRACTURE SURGERY    . GASTRIC BYPASS  2004  . IVC FILTER PLACEMENT ([facility] HX)    . JOINT REPLACEMENT Left 07/30/2016   hip  . JOINT REPLACEMENT Right 2015   knee  . KNEE ARTHROSCOPY Left 2004 & 2006  . LAMINECTOMY  2014   lumbar 3-6 sacral 1-3  fusion  . NECK SURGERY      times 2  . PERIPHERAL VASCULAR CATHETERIZATION N/A 05/21/2015   Procedure: IVC Filter Insertion;  Surgeon: Renford Dills, MD;  Location: [facility] INVASIVE CV LAB;  Service: Cardiovascular;  Laterality: N/A;  . PERIPHERAL VASCULAR CATHETERIZATION N/A 07/23/2015   Procedure: IVC Filter Removal;  Surgeon: Renford Dills, MD;  Location: [facility] INVASIVE CV LAB;  Service: Cardiovascular;  Laterality: N/A;  . PERIPHERAL VASCULAR CATHETERIZATION N/A 07/21/2016   Procedure: IVC Filter Insertion;  Surgeon: Renford Dills, MD;  Location: [facility] INVASIVE CV LAB;  Service: Cardiovascular;  Laterality:  N/A;  . PERIPHERAL VASCULAR CATHETERIZATION N/A 10/16/2016   Procedure: IVC Filter Removal;  Surgeon: Renford DillsGregory G Schnier, MD;  Location: [facility] INVASIVE CV LAB;  Service: Cardiovascular;  Laterality: N/A;  . TOOTH EXTRACTION  07/26/2017  09/01/2017  . TOTAL HIP ARTHROPLASTY Left 07/30/2016   Procedure: TOTAL HIP ARTHROPLASTY ANTERIOR APPROACH;  Surgeon: Kennedy BuckerMichael Menz, MD;  Location: [facility] ORS;  Service: Orthopedics;  Laterality: Left;  . TOTAL KNEE ARTHROPLASTY Right 05/30/2015   Procedure: TOTAL KNEE ARTHROPLASTY;  Surgeon: Kennedy BuckerMichael Menz,  MD;  Location: [facility] ORS;  Service: Orthopedics;  Laterality: Right;   Allergies  Allergen Reactions  . Bee Venom Anaphylaxis  . Morphine And Related Anaphylaxis    Blisters itching, N&V, face and throat swelling  . Adhesive [Tape] Other (See Comments)    Itching and water blisters. Paper tape is OK.  . Celebrex [Celecoxib] Swelling  . Tegaderm Ag Mesh [Silver] Other (See Comments)    itching and water blisters itching and water blisters  . Grape Seed Itching and Swelling    Lips and mouth swelling/itching.  Grapes not grape seed  . Latuda [Lurasidone Hcl] Other (See Comments)  . Tapentadol Other (See Comments)     RUE: Skin intact, no deformity. NTTP throughout. Full AROM shoulder, elbow, and wrist. SILT M/R/U nerves. Motor intact M/R/U nerves. Palpable Radial pulse.   LLE: Skin intact, surgical incision from previous THA. No deformity. NTTP throughout. Full ROM. Neg logroll. SILT DPN/SPN/T/S/S nerves. Fires EHL/TA/GSC. Palpable DP pulse.   RLE: Skin intact, surgical incision on R knee s/p TKA. Limb shortened and ER. TTP about the hip. NTTP about the knee and remainder of the leg. SILT DPN/SPN/T/S/S nerves. Fires EHL/TA/GSC. Palpable DP pulse.    Xray: AP pelvis and R hip xrays reviewed. Stable L THA in place. Displaced R femoral neck fracture w/ varus angulation. Osteopenia. O/w no other fractures identified.   Recent Results (from the past 2160 hour(s))  CBC with Differential     Status: None   Collection Time: 10/02/18 10:52 PM  Result Value Ref Range   WBC 7.5 4.0 - 10.5 K/uL   RBC 4.16 3.87 - 5.11 MIL/uL   Hemoglobin 12.8 12.0 - 15.0 g/dL   HCT 95.637.7 21.336.0 - 08.646.0 %   MCV 90.6 80.0 - 100.0 fL   MCH 30.8 26.0 - 34.0 pg   MCHC 34.0 30.0 - 36.0 g/dL   RDW 57.812.1 46.911.5 - 62.915.5 %   Platelets 276 150 - 400 K/uL   nRBC 0.0 0.0 - 0.2 %   Neutrophils Relative % 59 %   Neutro Abs 4.4 1.7 - 7.7 K/uL   Lymphocytes Relative 34 %   Lymphs Abs 2.6 0.7 - 4.0 K/uL   Monocytes Relative 7  %   Monocytes Absolute 0.5 0.1 - 1.0 K/uL   Eosinophils Relative 0 %   Eosinophils Absolute 0.0 0.0 - 0.5 K/uL   Basophils Relative 0 %   Basophils Absolute 0.0 0.0 - 0.1 K/uL   Immature Granulocytes 0 %   Abs Immature Granulocytes 0.03 0.00 - 0.07 K/uL    Comment: Performed at [facility], [address] [place], [place] [address]  Basic metabolic panel     Status: Abnormal   Collection Time: 10/02/18 10:52 PM  Result Value Ref Range   Sodium 121 (L) 135 - 145 mmol/L   Potassium 4.3 3.5 - 5.1 mmol/L   Chloride 85 (L) 98 - 111 mmol/L   CO2 26 22 - 32 mmol/L  Glucose, Bld 145 (H) 70 - 99 mg/dL   BUN 13 8 - 23 mg/dL   Creatinine, Ser 1.610.73 0.44 - 1.00 mg/dL   Calcium 8.4 (L) 8.9 - 10.3 mg/dL   GFR calc non Af Amer >60 >60 mL/min   GFR calc Af Amer >60 >60 mL/min   Anion gap 10 5 - 15    Comment: Performed at [facility], [address] [place], [place] [address]  Troponin I - Once     Status: None   Collection Time: 10/02/18 10:52 PM  Result Value Ref Range   Troponin I <0.03 <0.03 ng/mL    Comment: Performed at [facility], [address] [place], [place] [address]  Glucose, capillary     Status: Abnormal   Collection Time: 10/03/18  1:34 AM  Result Value Ref Range   Glucose-Capillary 144 (H) 70 - 99 mg/dL  Surgical pcr screen     Status: None   Collection Time: 10/03/18  1:48 AM  Result Value Ref Range   MRSA, PCR NEGATIVE NEGATIVE   Staphylococcus aureus NEGATIVE NEGATIVE    Comment: (NOTE) The Xpert SA Assay (FDA approved for NASAL specimens in patients 66 years of age and older), is one component of a comprehensive surveillance program. It is not intended to diagnose infection nor to guide or monitor treatment. Performed at [facility], [address] [place], [place] [address]   Basic metabolic panel     Status: Abnormal   Collection Time: 10/03/18  2:53 AM  Result Value Ref Range   Sodium 124 (L) 135 - 145 mmol/L    Potassium 3.9 3.5 - 5.1 mmol/L   Chloride 86 (L) 98 - 111 mmol/L   CO2 31 22 - 32 mmol/L   Glucose, Bld 156 (H) 70 - 99 mg/dL   BUN 13 8 - 23 mg/dL   Creatinine, Ser 7.820.58 0.44 - 1.00 mg/dL   Calcium 8.3 (L) 8.9 - 10.3 mg/dL   GFR calc non Af Amer >60 >60 mL/min   GFR calc Af Amer >60 >60 mL/min   Anion gap 7 5 - 15    Comment: Performed at [facility], [address] [place], [place] [address]  CBC     Status: Abnormal   Collection Time: 10/03/18  2:53 AM  Result Value Ref Range   WBC 11.5 (H) 4.0 - 10.5 K/uL   RBC 4.10 3.87 - 5.11 MIL/uL   Hemoglobin 12.7 12.0 - 15.0 g/dL   HCT 30.838.1 65.736.0 - 84.646.0 %   MCV 92.9 80.0 - 100.0 fL   MCH 31.0 26.0 - 34.0 pg   MCHC 33.3 30.0 - 36.0 g/dL   RDW 96.212.2 95.211.5 - 84.115.5 %   Platelets 275 150 - 400 K/uL   nRBC 0.0 0.0 - 0.2 %    Comment: Performed at [facility], [address] [place], [place] [address]  Osmolality, urine     Status: Abnormal   Collection Time: 10/03/18  3:00 AM  Result Value Ref Range   Osmolality, Ur 265 (L) 300 - 900 mOsm/kg    Comment: Performed at [facility], [address] [place], [place] [address]  Sodium, urine, random     Status: None   Collection Time: 10/03/18  3:30 AM  Result Value Ref Range   Sodium, Ur 13 mmol/L    Comment: Performed at [facility], [address] [place], [place] [address]      A/P: 68  y/o F w/ a displaced R femoral neck fracture -After speaking with Dr. Ernest Pine, plan for OR tomorrow. NPO at midnight/IVF.   -NWB RLE -SCDs while in bed for DVT prophylaxis. Plan for chemoprophylaxis post op

## 2018-10-03 NOTE — Progress Notes (Signed)
Received call from Dr. Ernest Pine. Will see patient in between surgery cases. Do not give morning aspirin per Dr. Ernest Pine.

## 2018-10-03 NOTE — [place] FL2 (Signed)
[place] MEDICAID FL2 LEVEL OF CARE SCREENING TOOL     IDENTIFICATION  Patient Name: Amy Sweeney Birthdate: Oct 19, 1950 Sex: female Admission Date (Current Location): 10/02/2018  [facility] [place]   Facility and Address:  [facility], [address], [place], [place] [address]      Provider Number: [id number]  Attending Physician Name and Address:  Enid BaasKalisetti, Radhika, MD  Relative Name and Phone Number:       Current Level of Care: Hospital Recommended Level of Care: Skilled Nursing Facility Prior Approval Number:    Date Approved/Denied:   PASRR Number:    Discharge Plan: SNF    Current Diagnoses: Patient Active Problem List   Diagnosis Date Noted  . Closed right hip fracture (HCC) 10/03/2018  . Chest pain at rest 03/03/2018  . Hypertension, essential 03/03/2018  . Closed fracture of proximal tibia 02/22/2018  . DDD (degenerative disc disease), cervical 02/22/2018  . Chronic anticoagulation (Eliquis) 02/22/2018  . History of hip replacement (Left) 02/22/2018  . Chronic pain syndrome 02/22/2018  . Pain medication agreement (w/ [facility]) 02/22/2018  . Type 2 diabetes mellitus without complication, without long-term current use of insulin (HCC) 11/30/2017  . Abdominal wall bulge 11/10/2017  . Age-related osteoporosis without current pathological fracture 09/22/2017  . Metformin overdose 04/01/2017  . COPD (chronic obstructive pulmonary disease) (HCC) 04/01/2017  . Encounter for long-term opiate analgesic use 12/15/2016  . Psychophysiological insomnia 11/20/2016  . Bipolar depression (HCC) 11/20/2016  . Back muscle spasm 10/08/2016  . Hypercoagulable state (HCC) 09/28/2016  . DJD (degenerative joint disease) 09/28/2016  . History of DVT (deep vein thrombosis) 07/16/2016  . Osteoarthritis of hip (Right) 06/18/2016  . Chronic hip pain (Right) 05/08/2016  . Anxiety state 04/14/2016  . Chronic bilateral  thoracic back pain 04/14/2016  . Acute right ankle pain 03/12/2016  . Traumatic arthritis of knee 05/30/2015  . Bipolar 1 disorder, mixed, moderate (HCC) 10/26/2014  . Post-traumatic osteoarthritis of one knee 08/23/2014  . Lumbar radicular pain 05/31/2014  . Cervical neck pain with evidence of disc disease 04/24/2014  . Chronic low back pain 04/24/2014  . Bilateral chronic knee pain 04/24/2014  . Leg pain 03/30/2014  . Pulmonary embolism on right (HCC) 02/15/2014  . Tibial plateau fracture 01/16/2014  . Diabetes (HCC) 02/20/2013  . Bipolar disorder (HCC) 02/20/2013  . Cervical disc disease 02/20/2013  . Chronic, continuous use of opioids 02/20/2013  . 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 Obesity, Class II, BMI 35-39.9 02/20/2013    Orientation RESPIRATION BLADDER Height & Weight     Self, Time, Situation, Place  Normal Continent Weight: 244 lb (110.7 kg) Height:  5\' 8"  (172.7 cm)  BEHAVIORAL SYMPTOMS/MOOD NEUROLOGICAL BOWEL NUTRITION STATUS      Continent Diet(Diet: Regular )  AMBULATORY STATUS COMMUNICATION OF NEEDS Skin   Extensive Assist Verbally Surgical wounds                       Personal Care Assistance Level of Assistance  Bathing, Feeding, Dressing Bathing Assistance: Limited assistance Feeding assistance: Independent Dressing Assistance: Limited assistance     Functional Limitations Info  Sight, Hearing, Speech Sight Info: Impaired Hearing Info: Adequate Speech Info: Adequate    SPECIAL CARE FACTORS FREQUENCY  PT (By licensed PT), OT (By licensed OT)     PT Frequency: (5) OT Frequency: (5)            Contractures  Additional Factors Info  Code Status, Allergies Code Status Info: (Full Code. ) Allergies Info: (Bee Venom, Morphine And Related, Adhesive Tape, Celebrex Celecoxib, Tegaderm Ag Mesh Silver, Grape Seed, Latuda Lurasidone Hcl, Tapentadol)           Current Medications (10/03/2018):  This is the current hospital  active medication list Current Facility-Administered Medications  Medication Dose Route Frequency Provider Last Rate Last Dose  . acetaminophen (TYLENOL) tablet 650 mg  650 mg Oral Q6H PRN Mayo, Allyn Kenner, MD       Or  . acetaminophen (TYLENOL) suppository 650 mg  650 mg Rectal Q6H PRN Mayo, Allyn Kenner, MD      . albuterol (PROVENTIL) (2.5 MG/3ML) 0.083% nebulizer solution 2.5 mg  2.5 mg Nebulization Q6H PRN Mayo, Allyn Kenner, MD      . baclofen (LIORESAL) tablet 5 mg  5 mg Oral BID Mayo, Allyn Kenner, MD   5 mg at 10/03/18 1016  . carvedilol (COREG) tablet 12.5 mg  12.5 mg Oral BID WC Mayo, Allyn Kenner, MD   12.5 mg at 10/03/18 0817  . clonazePAM (KLONOPIN) tablet 0.5 mg  0.5 mg Oral BID Mayo, Allyn Kenner, MD   0.5 mg at 10/03/18 1016  . diphenhydrAMINE (BENADRYL) capsule 50-100 mg  50-100 mg Oral QHS Mayo, Allyn Kenner, MD   50 mg at 10/03/18 0232  . divalproex (DEPAKOTE ER) 24 hr tablet 1,500 mg  1,500 mg Oral Daily Mayo, Allyn Kenner, MD   1,500 mg at 10/03/18 1016  . DULoxetine (CYMBALTA) DR capsule 120 mg  120 mg Oral Daily Mayo, Allyn Kenner, MD   120 mg at 10/03/18 1017  . ferrous sulfate tablet 650 mg  650 mg Oral Q1400 Mayo, Allyn Kenner, MD      . fluticasone (FLONASE) 50 MCG/ACT nasal spray 1 spray  1 spray Each Nare BID Mayo, Allyn Kenner, MD      . gabapentin (NEURONTIN) capsule 1,200 mg  1,200 mg Oral TID Campbell Stall, MD   1,200 mg at 10/03/18 1015  . hydrALAZINE (APRESOLINE) injection 5 mg  5 mg Intravenous Q6H PRN Montez Morita, MD      . HYDROmorphone (DILAUDID) injection 1 mg  1 mg Intravenous Q3H PRN Enid Baas, MD      . insulin aspart (novoLOG) injection 0-5 Units  0-5 Units Subcutaneous QHS Mayo, Allyn Kenner, MD      . insulin aspart (novoLOG) injection 0-9 Units  0-9 Units Subcutaneous TID WC Mayo, Allyn Kenner, MD   2 Units at 10/03/18 0825  . irbesartan (AVAPRO) tablet 300 mg  300 mg Oral Daily Mayo, Allyn Kenner, MD      . MEDLINE mouth rinse  15 mL Mouth Rinse BID Mayo, Allyn Kenner, MD       . Melatonin TABS 5 mg  5 mg Oral QHS PRN Mayo, Allyn Kenner, MD      . OLANZapine [facility]) tablet 2.5 mg  2.5 mg Oral QHS Mayo, Allyn Kenner, MD   2.5 mg at 10/03/18 0233  . ondansetron (ZOFRAN) tablet 4 mg  4 mg Oral Q6H PRN Mayo, Allyn Kenner, MD       Or  . ondansetron [facility]) injection 4 mg  4 mg Intravenous Q6H PRN Mayo, Allyn Kenner, MD      . oxyCODONE (Oxy IR/ROXICODONE) immediate release tablet 15 mg  15 mg Oral Q6H PRN Enid Baas, MD   15 mg at 10/03/18 1120  . oxyCODONE (OXYCONTIN) 12 hr tablet 20 mg  20 mg Oral Q12H Kalisetti, Donnita Fallsadhika, MD      . polyethylene glycol (MIRALAX / GLYCOLAX) packet 17 g  17 g Oral Daily PRN Mayo, Allyn KennerKaty Dodd, MD      . traZODone (DESYREL) tablet 100 mg  100 mg Oral QHS Mayo, Allyn KennerKaty Dodd, MD   100 mg at 10/03/18 0233  . vitamin B-12 (CYANOCOBALAMIN) tablet 5,000 mcg  5,000 mcg Oral Q1400 Mayo, Allyn KennerKaty Dodd, MD         Discharge Medications: Please see discharge summary for a list of discharge medications.  Relevant Imaging Results:  Relevant Lab Results:   Additional Information (SSN: [id number])  Jeyson Deshotel, Darleen CrockerBailey M, LCSW

## 2018-10-03 NOTE — Clinical Social Work Note (Signed)
Clinical Social Work Assessment  Patient Details  Name: Amy Sweeney MRN: [id number] Date of Birth: [birth year]  Date of referral:  10/03/18               Reason for consult:  Facility Placement                Permission sought to share information with:  Chartered certified accountant granted to share information::  Yes, Verbal Permission Granted  Name::      [place]::   [employer]   Relationship::     Contact Information:     Housing/Transportation Living arrangements for the past 2 months:  [place] of Information:  Patient Patient Interpreter Needed:  None Criminal Activity/Legal Involvement Pertinent to Current Situation/Hospitalization:  No - Comment as needed Significant Relationships:  Spouse Lives with:  Spouse Do you feel safe going back to the place where you live?  Yes Need for family participation in patient care:  Yes (Comment)  Care giving concerns:  Patient lives in [place] with her husband Amy Sweeney.    Social Worker assessment / plan:  Holiday representative ([place]) reviewed chart and noted that patient has a hip fracture. Per RN patient will have surgery tomorrow. PT is pending. CSW met with patient alone at bedside to discuss D/C plan. Patient was alert and oriented X4 and was laying in the bed. Patient reported that she is having issues with pain and she is not getting her usual amount of pain medicine that she is prescribed through [facility]. CSW provided emotional support and made RN aware of above. CSW introduced self and explained role of CSW department. Per patient she lives in [place] with her husband. CSW explained that after surgery PT will evaluate her and make a recommendation of home health or SNF. Patient reported that she will need to go to SNF and was in [place] in 2014 or 2015, she could not recall the year. CSW explained that medicare requires a 3 night qualifying inpatient hospital stay in  order to pay for SNF. Patient was admitted to inpatient 1/20. Patient verbalized her understanding and is agreeable to SNF search in [place]. FL2 complete and faxed out. CSW will continue to follow and assist as needed.   Employment status:  Disabled (Comment on whether or not currently receiving Disability) Insurance information:  Medicare PT Recommendations:  Not assessed at this time Information / Referral to community resources:  [place]  Patient/Family's Response to care:  Patient is agreeable to SNF search in [place].   Patient/Family's Understanding of and Emotional Response to Diagnosis, Current Treatment, and Prognosis:  Patient was very pleasant and thanked CSW for assistance.   Emotional Assessment Appearance:  Appears stated age Attitude/Demeanor/Rapport:    Affect (typically observed):  Accepting, Adaptable, Pleasant Orientation:  Oriented to Self, Oriented to Place, Oriented to  Time, Oriented to Situation Alcohol / Substance use:  Not Applicable Psych involvement (Current and /or in the community):  No (Comment)  Discharge Needs  Concerns to be addressed:  Discharge Planning Concerns Readmission within the last 30 days:  No Current discharge risk:  Dependent with Mobility Barriers to Discharge:  Continued Medical Work up   [employer], Veronia Beets, LCSW 10/03/2018, 12:02 PM

## 2018-10-03 NOTE — Clinical Social Work Placement (Signed)
   CLINICAL SOCIAL WORK PLACEMENT  NOTE  Date:  10/03/2018  Patient Details  Name: Amy Sweeney MRN: [id number] Date of Birth: 1951/04/13  Clinical Social Work is seeking post-discharge placement for this patient at the Skilled  Nursing Facility level of care (*CSW will initial, date and re-position this form in  chart as items are completed):  Yes   Patient/family provided with [facility] Clinical Social Work Department's list of facilities offering this level of care within the geographic area requested by the patient (or if unable, by the patient's family).  Yes   Patient/family informed of their freedom to choose among providers that offer the needed level of care, that participate in Medicare, Medicaid or managed care program needed by the patient, have an available bed and are willing to accept the patient.  Yes   Patient/family informed of [facility]'s ownership interest in [facility] and [facility], as well as of the fact that they are under no obligation to receive care at these facilities.  PASRR submitted to EDS on 10/03/18     PASRR number received on       Existing PASRR number confirmed on       FL2 transmitted to all facilities in geographic area requested by pt/family on 10/03/18     FL2 transmitted to all facilities within larger geographic area on       Patient informed that his/her managed care company has contracts with or will negotiate with certain facilities, including the following:            Patient/family informed of bed offers received.  Patient chooses bed at       Physician recommends and patient chooses bed at      Patient to be transferred to   on  .  Patient to be transferred to facility by       Patient family notified on   of transfer.  Name of family member notified:        PHYSICIAN       Additional Comment:    _______________________________________________ Lakira Ogando, Darleen Crocker, LCSW 10/03/2018, 12:01 PM

## 2018-10-03 NOTE — Progress Notes (Signed)
Size wise bed ordered for pt. Confirmation number is [id number]

## 2018-10-03 NOTE — Progress Notes (Signed)
Pastoral Care Visit    10/03/18 1112  Clinical Encounter Type  Visited With Patient;Health care provider  Visit Type Initial  Referral From Nurse  Consult/Referral To Chaplain  Spiritual Encounters  Spiritual Needs Prayer;Emotional  Stress Factors  Patient Stress Factors Other (Comment) (ongoing pain)   Pt is presenting with distressing pain level.  Pt shared her ongoing struggle with falls and breaks.  She is a person of faith and believes in the power of prayer. She has been praying but "God is not listening."  She received addt'l pain meds while I was visiting but continued to wince with pain.  Chap provided comp presence, empathy, listened to pt story, explored her theological understanding of pain/God/prayer.  Pt requests addt'l chap visits. Prayed with pt.  Milinda Antis, [address]

## 2018-10-03 NOTE — Progress Notes (Signed)
Family Meeting Note  Advance Directive:no  Today a meeting took place with the Patient.  Patient is able to participate.  The following clinical team members were present during this meeting:MD  The following were discussed:Patient's diagnosis: right hip fracture, Patient's progosis: Unable to determine and Goals for treatment: Full Code   Patient states she has a lot of medical conditions, but she is overall doing pretty well.  She is able to walk around at home using a cane. We discussed code status in detail and she would like to be full code.  Additional follow-up to be provided: prn  Time spent during discussion:20 minutes  Hilton Sinclair, MD

## 2018-10-03 NOTE — Consult Note (Signed)
[facility]  CONSULT NOTE    Date: 10/03/2018                  Patient Name:  Amy Sweeney  MRN: [id number]  DOB: 1951/01/06  Age / Sex: 68 y.o., female         PCP: Barbette Reichmann, MD                 Service Requesting Consult: Dr. Nemiah Commander                 Reason for Consult: Hyponatremia             History of Present Illness: [name] admitted to [facility] on 10/02/2018 for  Hyponatremia [E87.1] Right hip pain [M25.551] Right elbow pain [M25.521] Fall, initial encounter [W19.XXXA] Closed fracture of right hip, initial encounter (HCC) [S72.001A]   Scheduled for surgery for right hip tomorrow. However serum sodium was found to be 121 on admission. Corrected to 124 with normal saline infusion. Nephrology consulted.   Patient states she drinks greater than a gallon of water due to chronic dry mouth. Patient states that she has been diagnosed with hyponatremia for years and is not currently on any diuretics.   Her diabetes and blood pressure are well controlled according to patient.    Medications: Outpatient medications: Medications Prior to Admission  Medication Sig Dispense Refill Last Dose  . acetaminophen (TYLENOL) 500 MG tablet Take 1,500 mg by mouth 3 (three) times daily. With Gabapentin   10/02/2018 at Unknown time  . amLODipine (NORVASC) 5 MG tablet Take 5 mg by mouth daily.   10/02/2018 at Unknown time  . aspirin 81 MG tablet Take 81 mg by mouth daily.    10/02/2018 at Unknown time  . atorvastatin (LIPITOR) 20 MG tablet Take 1 tablet by mouth at bedtime.   10/02/2018 at Unknown time  . baclofen (LIORESAL) 10 MG tablet Take 5 mg by mouth 2 (two) times daily.    10/02/2018 at Unknown time  . bimatoprost (LUMIGAN) 0.01 % SOLN Place 1 drop into both eyes at bedtime.    Past Week at Unknown time  . CALCIUM CITRATE PO Take 1,200 mg by mouth daily at 2 PM.    10/02/2018 at Unknown time  . carvedilol (COREG) 12.5 MG tablet Take 12.5 mg by  mouth 2 (two) times daily with a meal.   10/02/2018 at Unknown time  . Cholecalciferol (VITAMIN D3 PO) Take 1,200 Units by mouth daily at 2 PM.    10/02/2018 at Unknown time  . Cyanocobalamin (VITAMIN B-12) 2500 MCG SUBL Place 5,000 mcg under the tongue daily at 2 PM.    10/02/2018 at Unknown time  . denosumab (PROLIA) 60 MG/ML SOLN injection Inject 60 mg into the skin every 6 (six) months. Administer in upper arm, thigh, or abdomen   Past Month at Unknown time  . diphenhydrAMINE (SOMINEX) 25 MG tablet Take 50 mg by mouth at bedtime. 50 mg at bedtime, if patient is manic due to BIPOLAR 1 she will take 100 mg at bedtime.   prn at prn  . divalproex (DEPAKOTE ER) 500 MG 24 hr tablet Take 1,500 mg by mouth daily.    10/02/2018 at Unknown time  . DULoxetine (CYMBALTA) 60 MG capsule Take 120 mg by mouth daily.    10/02/2018 at Unknown time  . ferrous sulfate 325 (65 FE) MG tablet Take 650 mg by mouth every other day.    10/02/2018  at Unknown time  . fluticasone (FLONASE) 50 MCG/ACT nasal spray Place 1 spray into both nostrils 2 (two) times daily.    10/02/2018 at Unknown time  . gabapentin (NEURONTIN) 400 MG capsule Take 1,200 mg by mouth 3 (three) times daily. With Tylenol 1500 mg   10/02/2018 at Unknown time  . Ginkgo Biloba 60 MG CAPS Take 120 mg by mouth daily at 2 PM.    10/02/2018 at Unknown time  . lisinopril (PRINIVIL,ZESTRIL) 20 MG tablet Take 20 mg by mouth daily.     . Magnesium Oxide -Mg Supplement 250 MG TABS Take 500 mg by mouth daily at 2 PM.    10/02/2018 at Unknown time  . metFORMIN (GLUCOPHAGE) 1000 MG tablet Take 2,000 mg by mouth daily with breakfast.    10/02/2018 at Unknown time  . Multiple Vitamins-Minerals (ONE-A-DAY WOMENS 50 PLUS PO) Take 2 tablets by mouth daily at 2 PM.    10/02/2018 at Unknown time  . OLANZapine (ZYPREXA) 2.5 MG tablet Take 7.5 mg by mouth at bedtime.    Past Week at Unknown time  . oxyCODONE (OXYCONTIN) 10 mg 12 hr tablet Take 20 mg by mouth every 12 (twelve) hours.    10/02/2018 at Unknown time  . oxyCODONE (ROXICODONE) 15 MG immediate release tablet Take 15 mg by mouth every 6 (six) hours as needed.   0 10/02/2018 at Unknown time  . protein supplement shake (PREMIER PROTEIN) LIQD Take 11 oz by mouth daily with breakfast. CARAMEL OR STRAWBERRY PREFERRED   other at other  . traZODone (DESYREL) 100 MG tablet Take 1 tablet (100 mg total) by mouth at bedtime. 5 tablet 0 Past Week at Unknown time  . vitamin E 1000 UNIT capsule Take 2,000 Units by mouth at bedtime.    10/02/2018 at Unknown time  . Zinc 50 MG TABS Take 100 mg by mouth daily at 2 PM.    10/02/2018 at Unknown time  . Melatonin 1 MG TABS Take 1 tablet (1 mg total) by mouth at bedtime as needed. (Patient not taking: Reported on 10/03/2018) 5 tablet 0 Not Taking at Unknown time  . naloxone (NARCAN) nasal spray 4 mg/0.1 mL Place 1 spray into the nose once.   prn at prn  . nitroGLYCERIN (NITROSTAT) 0.4 MG SL tablet Place 0.4 mg under the tongue every 5 (five) minutes as needed. For chest pain.   prn at prn    Current medications: Current Facility-Administered Medications  Medication Dose Route Frequency Provider Last Rate Last Dose  . acetaminophen (TYLENOL) tablet 650 mg  650 mg Oral Q6H PRN Mayo, Allyn KennerKaty Dodd, MD       Or  . acetaminophen (TYLENOL) suppository 650 mg  650 mg Rectal Q6H PRN Mayo, Allyn KennerKaty Dodd, MD      . albuterol (PROVENTIL) (2.5 MG/3ML) 0.083% nebulizer solution 2.5 mg  2.5 mg Nebulization Q6H PRN Mayo, Allyn KennerKaty Dodd, MD      . baclofen (LIORESAL) tablet 5 mg  5 mg Oral BID Mayo, Allyn KennerKaty Dodd, MD   5 mg at 10/03/18 1016  . carvedilol (COREG) tablet 12.5 mg  12.5 mg Oral BID WC Mayo, Allyn KennerKaty Dodd, MD   12.5 mg at 10/03/18 0817  . clonazePAM (KLONOPIN) tablet 0.5 mg  0.5 mg Oral BID Mayo, Allyn KennerKaty Dodd, MD   0.5 mg at 10/03/18 1016  . diphenhydrAMINE (BENADRYL) capsule 50-100 mg  50-100 mg Oral QHS Mayo, Allyn KennerKaty Dodd, MD   50 mg at 10/03/18 0232  . divalproex (DEPAKOTE ER) 24  hr tablet 1,500 mg  1,500 mg Oral Daily  Mayo, Allyn Kenner, MD   1,500 mg at 10/03/18 1016  . DULoxetine (CYMBALTA) DR capsule 120 mg  120 mg Oral Daily Mayo, Allyn Kenner, MD   120 mg at 10/03/18 1017  . ferrous sulfate tablet 650 mg  650 mg Oral Q1400 Campbell Stall, MD   650 mg at 10/03/18 1355  . fluticasone (FLONASE) 50 MCG/ACT nasal spray 1 spray  1 spray Each Nare BID Mayo, Allyn Kenner, MD      . gabapentin (NEURONTIN) capsule 1,200 mg  1,200 mg Oral TID Campbell Stall, MD   1,200 mg at 10/03/18 1607  . hydrALAZINE (APRESOLINE) injection 5 mg  5 mg Intravenous Q6H PRN Montez Morita, MD      . HYDROmorphone (DILAUDID) injection 1 mg  1 mg Intravenous Q3H PRN Enid Baas, MD   1 mg at 10/03/18 1355  . insulin aspart (novoLOG) injection 0-5 Units  0-5 Units Subcutaneous QHS Mayo, Allyn Kenner, MD      . insulin aspart (novoLOG) injection 0-9 Units  0-9 Units Subcutaneous TID WC Mayo, Allyn Kenner, MD   2 Units at 10/03/18 1220  . irbesartan (AVAPRO) tablet 300 mg  300 mg Oral Daily Mayo, Allyn Kenner, MD      . MEDLINE mouth rinse  15 mL Mouth Rinse BID Mayo, Allyn Kenner, MD      . Melatonin TABS 5 mg  5 mg Oral QHS PRN Mayo, Allyn Kenner, MD      . OLANZapine [facility]) tablet 2.5 mg  2.5 mg Oral QHS Mayo, Allyn Kenner, MD   2.5 mg at 10/03/18 0233  . ondansetron (ZOFRAN) tablet 4 mg  4 mg Oral Q6H PRN Mayo, Allyn Kenner, MD       Or  . ondansetron [facility]) injection 4 mg  4 mg Intravenous Q6H PRN Mayo, Allyn Kenner, MD      . oxyCODONE (Oxy IR/ROXICODONE) immediate release tablet 15 mg  15 mg Oral Q6H PRN Enid Baas, MD   15 mg at 10/03/18 1120  . oxyCODONE (OXYCONTIN) 12 hr tablet 20 mg  20 mg Oral Q12H Kalisetti, Radhika, MD      . polyethylene glycol (MIRALAX / GLYCOLAX) packet 17 g  17 g Oral Daily PRN Mayo, Allyn Kenner, MD      . sodium chloride tablet 2 g  2 g Oral TID WC Alim Cattell, MD      . traZODone (DESYREL) tablet 100 mg  100 mg Oral QHS Mayo, Allyn Kenner, MD   100 mg at 10/03/18 0233  . vitamin B-12 (CYANOCOBALAMIN) tablet  5,000 mcg  5,000 mcg Oral Q1400 Campbell Stall, MD   5,000 mcg at 10/03/18 1355      Allergies: Allergies  Allergen Reactions  . Bee Venom Anaphylaxis  . Morphine And Related Anaphylaxis    Blisters itching, N&V, face and throat swelling  . Adhesive [Tape] Other (See Comments)    Itching and water blisters. Paper tape is OK.  . Celebrex [Celecoxib] Swelling  . Tegaderm Ag Mesh [Silver] Other (See Comments)    itching and water blisters itching and water blisters  . Grape Seed Itching and Swelling    Lips and mouth swelling/itching.  Grapes not grape seed  . Latuda [Lurasidone Hcl] Other (See Comments)  . Tapentadol Other (See Comments)      Past Medical History: Past Medical History:  Diagnosis Date  . Anxiety   . Arthritis  degenerative artgritis  osteoarthritis  . Bipolar 1 disorder (HCC)   . Bipolar disorder (HCC)   . COPD (chronic obstructive pulmonary disease) (HCC)    hx pulmonary embolism  . Coronary artery disease   . DDD (degenerative disc disease), cervical   . DDD (degenerative disc disease), cervical   . Depression   . Diabetes mellitus without complication (HCC)    Pt takes metformin.   Marland Kitchen Dyspnea   . Family history of adverse reaction to anesthesia    father blood clot after surgery died  . Headache    history of migraines  . History of pulmonary embolus (PE)   . History of shingles   . Hypertension   . Lower extremity deep venous thrombosis (HCC)   . Lower extremity edema   . 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 Night terrors   . OCD (obsessive compulsive disorder)   . Osteoarthritis   . Osteoporosis   . PTSD (post-traumatic stress disorder)   . Vitamin B 12 deficiency      Past Surgical History: Past Surgical History:  Procedure Laterality Date  . CARDIAC CATHETERIZATION  2004  . CERVICAL LAMINOPLASTY    . CESAREAN SECTION     x2  . CHOLECYSTECTOMY     . COLONOSCOPY WITH PROPOFOL N/A 04/01/2018   Procedure: COLONOSCOPY WITH PROPOFOL;  Surgeon: Christena Deem, MD;  Location: [facility] ENDOSCOPY;  Service: Endoscopy;  Laterality: N/A;  . DILATION AND CURETTAGE OF UTERUS    . ESOPHAGOGASTRODUODENOSCOPY (EGD) WITH PROPOFOL N/A 04/01/2018   Procedure: ESOPHAGOGASTRODUODENOSCOPY (EGD) WITH PROPOFOL;  Surgeon: Christena Deem, MD;  Location: [facility] ENDOSCOPY;  Service: Endoscopy;  Laterality: N/A;  . FRACTURE SURGERY    . GASTRIC BYPASS  2004  . IVC FILTER PLACEMENT ([facility] HX)    . JOINT REPLACEMENT Left 07/30/2016   hip  . JOINT REPLACEMENT Right 2015   knee  . KNEE ARTHROSCOPY Left 2004 & 2006  . LAMINECTOMY  2014   lumbar 3-6 sacral 1-3  fusion  . NECK SURGERY      times 2  . PERIPHERAL VASCULAR CATHETERIZATION N/A 05/21/2015   Procedure: IVC Filter Insertion;  Surgeon: Renford Dills, MD;  Location: [facility] INVASIVE CV LAB;  Service: Cardiovascular;  Laterality: N/A;  . PERIPHERAL VASCULAR CATHETERIZATION N/A 07/23/2015   Procedure: IVC Filter Removal;  Surgeon: Renford Dills, MD;  Location: [facility] INVASIVE CV LAB;  Service: Cardiovascular;  Laterality: N/A;  . PERIPHERAL VASCULAR CATHETERIZATION N/A 07/21/2016   Procedure: IVC Filter Insertion;  Surgeon: Renford Dills, MD;  Location: [facility] INVASIVE CV LAB;  Service: Cardiovascular;  Laterality: N/A;  . PERIPHERAL VASCULAR CATHETERIZATION N/A 10/16/2016   Procedure: IVC Filter Removal;  Surgeon: Renford Dills, MD;  Location: [facility] INVASIVE CV LAB;  Service: Cardiovascular;  Laterality: N/A;  . TOOTH EXTRACTION  07/26/2017  09/01/2017  . TOTAL HIP ARTHROPLASTY Left 07/30/2016   Procedure: TOTAL HIP ARTHROPLASTY ANTERIOR APPROACH;  Surgeon: Kennedy Bucker, MD;  Location: [facility] ORS;  Service: Orthopedics;  Laterality: Left;  . TOTAL KNEE ARTHROPLASTY Right 05/30/2015   Procedure: TOTAL KNEE ARTHROPLASTY;  Surgeon: Kennedy Bucker, MD;  Location: [facility] ORS;  Service: Orthopedics;  Laterality: Right;      Family History: History reviewed. No pertinent family history.   Social History: Social History   Socioeconomic History  . Marital status: Married    Spouse name: Not on file  . Number of children: Not on  file  . Years of education: Not on file  . Highest education level: Not on file  Occupational History  . Not on file  Social Needs  . Financial resource strain: Not on file  . Food insecurity:    Worry: Not on file    Inability: Not on file  . Transportation needs:    Medical: Not on file    Non-medical: Not on file  Tobacco Use  . Smoking status: Never Smoker  . Smokeless tobacco: Never Used  Substance and Sexual Activity  . Alcohol use: Yes    Comment: SPECIAL OCCASIONS  . Drug use: No  . Sexual activity: Not on file  Lifestyle  . Physical activity:    Days per week: Not on file    Minutes per session: Not on file  . Stress: Not on file  Relationships  . Social connections:    Talks on phone: Not on file    Gets together: Not on file    Attends religious service: Not on file    Active member of club or organization: Not on file    Attends meetings of clubs or organizations: Not on file    Relationship status: Not on file  . Intimate partner violence:    Fear of current or ex partner: Not on file    Emotionally abused: Not on file    Physically abused: Not on file    Forced sexual activity: Not on file  Other Topics Concern  . Not on file  Social History Narrative  . Not on file     Review of Systems: Review of Systems  Constitutional: Negative.  Negative for chills, diaphoresis, fever, malaise/fatigue and weight loss.  HENT: Negative for congestion, ear discharge, ear pain, hearing loss, nosebleeds, sinus pain, sore throat and tinnitus.        Dry mouth  Eyes: Negative.  Negative for blurred vision, double vision, photophobia, pain, discharge and redness.  Respiratory: Negative.  Negative for cough, hemoptysis, sputum production, shortness of  breath, wheezing and stridor.   Cardiovascular: Negative.  Negative for chest pain, palpitations, orthopnea, claudication, leg swelling and PND.  Gastrointestinal: Negative.  Negative for abdominal pain, blood in stool, constipation, diarrhea, heartburn, melena, nausea and vomiting.  Genitourinary: Negative.  Negative for dysuria, flank pain, frequency, hematuria and urgency.  Musculoskeletal: Positive for back pain and joint pain. Negative for falls, myalgias and neck pain.  Skin: Negative.  Negative for itching and rash.  Neurological: Negative.  Negative for dizziness, tingling, tremors, sensory change, speech change, focal weakness, seizures, loss of consciousness, weakness and headaches.  Endo/Heme/Allergies: Negative.  Negative for environmental allergies and polydipsia. Does not bruise/bleed easily.  Psychiatric/Behavioral: Positive for depression. Negative for hallucinations, memory loss, substance abuse and suicidal ideas. The patient is not nervous/anxious and does not have insomnia.     Vital Signs: Blood pressure (!) 181/97, pulse 90, temperature 98.5 F (36.9 C), temperature source Oral, resp. rate 20, height 5\' 8"  (1.727 m), weight 110.7 kg, SpO2 95 %.  Weight trends: Filed Weights   10/02/18 2247  Weight: 110.7 kg    Physical Exam: General: NAD,   Head: Normocephalic, atraumatic. Moist oral mucosal membranes  Eyes: Anicteric, PERRL  Neck: Supple, trachea midline  Lungs:  Clear to auscultation  Heart: Regular rate and rhythm  Abdomen:  Soft, nontender,   Extremities: no peripheral edema. Right lower extremity pain and tenderness  Neurologic: Nonfocal, moving all four extremities  Skin: No lesions  Lab results: Basic Metabolic Panel: Recent Labs  Lab 10/02/18 2252 10/03/18 0253 10/03/18 0945 10/03/18 1419  NA 121* 124* 122* 121*  K 4.3 3.9 3.7  --   CL 85* 86* 86*  --   CO2 26 31 27   --   GLUCOSE 145* 156* 164*  --   BUN 13 13 9   --   CREATININE  0.73 0.58 0.55  --   CALCIUM 8.4* 8.3* 8.0*  --     Liver Function Tests: No results for input(s): AST, ALT, ALKPHOS, BILITOT, PROT, ALBUMIN in the last 168 hours. No results for input(s): LIPASE, AMYLASE in the last 168 hours. No results for input(s): AMMONIA in the last 168 hours.  CBC: Recent Labs  Lab 10/02/18 2252 10/03/18 0253  WBC 7.5 11.5*  NEUTROABS 4.4  --   HGB 12.8 12.7  HCT 37.7 38.1  MCV 90.6 92.9  PLT 276 275    Cardiac Enzymes: Recent Labs  Lab 10/02/18 2252  TROPONINI <0.03    BNP: Invalid input(s): POCBNP  CBG: Recent Labs  Lab 10/03/18 0134 10/03/18 0816 10/03/18 1157  GLUCAP 144* 167* 159*    Microbiology: Results for orders placed or performed during the hospital encounter of 10/02/18  Surgical pcr screen     Status: None   Collection Time: 10/03/18  1:48 AM  Result Value Ref Range Status   MRSA, PCR NEGATIVE NEGATIVE Final   Staphylococcus aureus NEGATIVE NEGATIVE Final    Comment: (NOTE) The Xpert SA Assay (FDA approved for NASAL specimens in patients 48 years of age and older), is one component of a comprehensive surveillance program. It is not intended to diagnose infection nor to guide or monitor treatment. Performed at [facility], [address] [place], [place] [address]     Coagulation Studies: No results for input(s): LABPROT, INR in the last 72 hours.  Urinalysis: No results for input(s): COLORURINE, LABSPEC, PHURINE, GLUCOSEU, HGBUR, BILIRUBINUR, KETONESUR, PROTEINUR, UROBILINOGEN, NITRITE, LEUKOCYTESUR in the last 72 hours.  Invalid input(s): APPERANCEUR    Imaging: Dg Chest 1 View  Result Date: 10/02/2018 CLINICAL DATA:  c/o mechanical fall tonight over a rug and fell onto right side and on the ground approx 45 minutes, pain from right knee to right hip, denies hitting head or LOCpre-op EXAM: CHEST  1 VIEW COMPARISON:  None. FINDINGS: Normal cardiac silhouette. Low lung volumes. No effusion,  infiltrate or pneumothorax. IMPRESSION: Low lung volumes.  No acute findings. Electronically Signed   By: Genevive Bi M.D.   On: 10/02/2018 23:35   Dg Elbow 2 Views Right  Result Date: 10/03/2018 CLINICAL DATA:  68 year old female with fall and right elbow pain. EXAM: RIGHT ELBOW - 2 VIEW COMPARISON:  None. FINDINGS: There is no acute fracture or dislocation. There is mild osteopenia. No joint effusion. The soft tissues are unremarkable. IMPRESSION: No acute fracture or dislocation. Electronically Signed   By: Elgie Collard M.D.   On: 10/03/2018 01:19   Dg Hip Unilat  With Pelvis 2-3 Views Right  Result Date: 10/02/2018 CLINICAL DATA:  67 year old female with fall and right hip pain. EXAM: DG HIP (WITH OR WITHOUT PELVIS) 2-3V RIGHT COMPARISON:  CT of the abdomen pelvis dated 05/05/2018 FINDINGS: There is a mildly displaced fracture of the right femoral neck with mild varus angulation. No dislocation. There is advanced osteopenia. There is a left hip arthroplasty. The soft tissues are grossly unremarkable. IMPRESSION: Right femoral neck fracture. Electronically Signed   By: Ceasar Mons.D.  On: 10/02/2018 23:34      Assessment & Plan: Ms. LACYE WEICHMAN is a 68 y.o. white female with hypertension, diabetes mellitus type II, bipolar disorder, coronary artery disease, COPD, gastric bypass, chronic pain on chronic narcotics , who was admitted to [facility] on 10/02/2018 for Hyponatremia [E87.1] Right hip pain [M25.551] Right elbow pain [M25.521] Fall, initial encounter [W19.XXXA] Closed fracture of right hip, initial encounter (HCC) [S72.001A]  1. Hyponatremia: acute on chronic hyponatremia. Patient has history of polydipsia. Labs are most consistent with this.  - Fluid restriction - PO salt tablets  2. Hypertension: elevated. Patient states this is because her pain is not well controlled.  Carvedilol and irbesartan. Currently holding amlodipine.   3. 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 On metformin as outpatient.  Hyperglycemia can cause dry mouth and polydipsia. However hemoglobin A1c was 5.7% on 06/15/18.  - recheck      LOS: 0 Rigel Filsinger 1/20/20204:14 PM

## 2018-10-03 NOTE — H&P (Addendum)
[facility] -  at [facility]   PATIENT NAME: Amy MaladyRebecca Sweeney    MR#:  [id number]  DATE OF BIRTH:  1951/01/23  DATE OF ADMISSION:  10/02/2018  PRIMARY CARE PHYSICIAN: Barbette ReichmannHande, Vishwanath, MD   REQUESTING/REFERRING PHYSICIAN: Chiquita LothJade Sung, MD  CHIEF COMPLAINT:   Chief Complaint  Patient presents with  . Fall  . Hip Pain    HISTORY OF PRESENT ILLNESS:  Amy Sweeney  is a 68 y.o. female with a known history of hypertension, type 2 diabetes, COPD, CAD, depression, anxiety, bipolar disorder, chronic pain syndrome, history of DVT/PE (previously on Eliquis, but no longer on anticoagulation), history of multiple orthopedic surgeries of the lower extremities who presented to the ED with right lateral hip pain s/p mechanical fall at home this evening.  She was moving and fell backwards over a folded-up rug.  She landed directly on her right elbow, right hip, and the outside of her right knee.  She had immediate right lateral hip pain.  The pain radiates down the entire outside of her leg to her foot.  She endorses some tingling of that same area.  She uses a cane at baseline.  In the ED, vitals were unremarkable.  Labs were significant for sodium 121.  Chest x-ray negative.  Right hip x-ray with right femoral neck fracture.  Hospitalists were called for admission.   PAST MEDICAL HISTORY:   Past Medical History:  Diagnosis Date  . Anxiety   . Arthritis    degenerative artgritis  osteoarthritis  . Bipolar 1 disorder (HCC)   . Bipolar disorder (HCC)   . COPD (chronic obstructive pulmonary disease) (HCC)    hx pulmonary embolism  . Coronary artery disease   . DDD (degenerative disc disease), cervical   . DDD (degenerative disc disease), cervical   . Depression   . Diabetes mellitus without complication (HCC)    Pt takes metformin.   Marland Kitchen. Dyspnea   . Family history of adverse reaction to anesthesia    father blood clot after surgery died  . Headache    history  of migraines  . History of pulmonary embolus (PE)   . History of shingles   . Hypertension   . Lower extremity deep venous thrombosis (HCC)   . Lower extremity edema   . 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 Night terrors   . OCD (obsessive compulsive disorder)   . Osteoarthritis   . Osteoporosis   . PTSD (post-traumatic stress disorder)   . Vitamin B 12 deficiency     PAST SURGICAL HISTORY:   Past Surgical History:  Procedure Laterality Date  . CARDIAC CATHETERIZATION  2004  . CERVICAL LAMINOPLASTY    . CESAREAN SECTION     x2  . CHOLECYSTECTOMY    . COLONOSCOPY WITH PROPOFOL N/A 04/01/2018   Procedure: COLONOSCOPY WITH PROPOFOL;  Surgeon: Christena DeemSkulskie, Martin U, MD;  [facility];  Service: Endoscopy;  Laterality: N/A;  . DILATION AND CURETTAGE OF UTERUS    . ESOPHAGOGASTRODUODENOSCOPY (EGD) WITH PROPOFOL N/A 04/01/2018   Procedure: ESOPHAGOGASTRODUODENOSCOPY (EGD) WITH PROPOFOL;  Surgeon: Christena DeemSkulskie, Martin U, MD;  [facility];  Service: Endoscopy;  Laterality: N/A;  . FRACTURE SURGERY    . GASTRIC BYPASS  2004  . IVC FILTER PLACEMENT ([facility] HX)    . JOINT REPLACEMENT Left 07/30/2016   hip  . JOINT REPLACEMENT Right 2015   knee  . KNEE ARTHROSCOPY Left 2004 &  2006  . LAMINECTOMY  2014   lumbar 3-6 sacral 1-3  fusion  . NECK SURGERY      times 2  . PERIPHERAL VASCULAR CATHETERIZATION N/A 05/21/2015   Procedure: IVC Filter Insertion;  Surgeon: Renford Dills, MD;  Location: [facility] INVASIVE CV LAB;  Service: Cardiovascular;  Laterality: N/A;  . PERIPHERAL VASCULAR CATHETERIZATION N/A 07/23/2015   Procedure: IVC Filter Removal;  Surgeon: Renford Dills, MD;  Location: [facility] INVASIVE CV LAB;  Service: Cardiovascular;  Laterality: N/A;  . PERIPHERAL VASCULAR CATHETERIZATION N/A 07/21/2016   Procedure: IVC Filter Insertion;  Surgeon: Renford Dills, MD;  Location: [facility] INVASIVE  CV LAB;  Service: Cardiovascular;  Laterality: N/A;  . PERIPHERAL VASCULAR CATHETERIZATION N/A 10/16/2016   Procedure: IVC Filter Removal;  Surgeon: Renford Dills, MD;  Location: [facility] INVASIVE CV LAB;  Service: Cardiovascular;  Laterality: N/A;  . TOOTH EXTRACTION  07/26/2017  09/01/2017  . TOTAL HIP ARTHROPLASTY Left 07/30/2016   Procedure: TOTAL HIP ARTHROPLASTY ANTERIOR APPROACH;  Surgeon: Kennedy Bucker, MD;  Location: [facility] ORS;  Service: Orthopedics;  Laterality: Left;  . TOTAL KNEE ARTHROPLASTY Right 05/30/2015   Procedure: TOTAL KNEE ARTHROPLASTY;  Surgeon: Kennedy Bucker, MD;  Location: [facility] ORS;  Service: Orthopedics;  Laterality: Right;    SOCIAL HISTORY:   Social History   Tobacco Use  . Smoking status: Never Smoker  . Smokeless tobacco: Never Used  Substance Use Topics  . Alcohol use: Yes    Comment: SPECIAL OCCASIONS    FAMILY HISTORY:  Father-diabetes  DRUG ALLERGIES:   Allergies  Allergen Reactions  . Bee Venom Anaphylaxis  . Morphine And Related Anaphylaxis    Blisters itching, N&V, face and throat swelling  . Adhesive [Tape] Other (See Comments)    Itching and water blisters. Paper tape is OK.  . Celebrex [Celecoxib] Swelling  . Tegaderm Ag Mesh [Silver] Other (See Comments)    itching and water blisters itching and water blisters  . Grape Seed Itching and Swelling    Lips and mouth swelling/itching.  Grapes not grape seed  . Latuda [Lurasidone Hcl] Other (See Comments)  . Tapentadol Other (See Comments)    REVIEW OF SYSTEMS:   Review of Systems  Constitutional: Negative for chills and fever.  HENT: Negative for congestion and sore throat.   Eyes: Negative for blurred vision and double vision.  Respiratory: Negative for cough and shortness of breath.   Cardiovascular: Negative for chest pain, palpitations and leg swelling.  Gastrointestinal: Negative for abdominal pain, nausea and vomiting.  Genitourinary: Negative for dysuria and urgency.    Musculoskeletal: Positive for back pain, falls, joint pain and neck pain.  Neurological: Negative for dizziness, loss of consciousness and headaches.  Psychiatric/Behavioral: Negative for depression. The patient is not nervous/anxious.     MEDICATIONS AT HOME:   Prior to Admission medications   Medication Sig Start Date End Date Taking? Authorizing Provider  acetaminophen (TYLENOL) 500 MG tablet Take 1,500 mg by mouth 3 (three) times daily. With Gabapentin    [provider]  apixaban (ELIQUIS) 5 MG TABS tablet Take 5 mg by mouth 2 (two) times daily.     [provider]  aspirin 325 MG tablet Take 325 mg by mouth daily.    [provider]  baclofen (LIORESAL) 10 MG tablet Take 5 mg by mouth 2 (two) times daily.     [provider]  CALCIUM CITRATE PO Take 2,400 mg by mouth daily at 2 PM.  [provider]  carvedilol (COREG) 12.5 MG tablet Take 12.5 mg by mouth 2 (two) times daily with a meal.    [provider]  Cholecalciferol (VITAMIN D3) 5000 UNITS CAPS Take 25,000 Units by mouth daily at 2 PM.     [provider]  clonazePAM (KLONOPIN) 0.5 MG tablet Take 0.5 mg by mouth 2 (two) times daily.    [provider]  Cyanocobalamin (VITAMIN B-12) 2500 MCG SUBL Place 5,000 mcg under the tongue daily at 2 PM.     [provider]  denosumab (PROLIA) 60 MG/ML SOLN injection Inject 60 mg into the skin every 6 (six) months. Administer in upper arm, thigh, or abdomen    [provider]  diphenhydrAMINE (SOMINEX) 25 MG tablet Take 50 mg by mouth at bedtime. 50 mg at bedtime, if patient is manic due to BIPOLAR 1 she will take 100 mg at bedtime.    [provider]  divalproex (DEPAKOTE ER) 500 MG 24 hr tablet Take 1,500 mg by mouth daily.     [provider]  DULoxetine (CYMBALTA) 60 MG capsule Take 120 mg by mouth daily.     [provider]  fentaNYL (DURAGESIC - DOSED MCG/HR) 75 MCG/HR  Place 1 patch (75 mcg total) onto the skin every 3 (three) days. Patient not taking: Reported on 04/01/2018 08/01/16   Dedra Skeens, PA-C  ferrous sulfate 325 (65 FE) MG tablet Take 650 mg by mouth daily at 2 PM.     [provider]  fluticasone (FLONASE) 50 MCG/ACT nasal spray Place 1 spray into both nostrils 2 (two) times daily.     [provider]  gabapentin (NEURONTIN) 400 MG capsule Take 1,200 mg by mouth 3 (three) times daily. With Tylenol 1500 mg 05/24/16   [provider]  Ginkgo Biloba 60 MG CAPS Take 120 mg by mouth daily at 2 PM.     [provider]  Magnesium Oxide -Mg Supplement 250 MG TABS Take 500 mg by mouth daily at 2 PM.     [provider]  Melatonin 1 MG TABS Take 1 tablet (1 mg total) by mouth at bedtime as needed. 03/11/16   Cuthriell, Delorise Royals, PA-C  metFORMIN (GLUCOPHAGE) 1000 MG tablet Take 2,000 mg by mouth daily with breakfast.     [provider]  Multiple Vitamins-Minerals (ONE-A-DAY WOMENS 50 PLUS PO) Take 2 tablets by mouth daily at 2 PM.     [provider]  naloxone [facility]) nasal spray 4 mg/0.1 mL Place 1 spray into the nose once.    [provider]  nitroGLYCERIN (NITROSTAT) 0.4 MG SL tablet Place 0.4 mg under the tongue every 5 (five) minutes as needed. For chest pain.    [provider]  OLANZapine (ZYPREXA) 2.5 MG tablet Take 2.5 mg by mouth at bedtime.    [provider]  olmesartan (BENICAR) 40 MG tablet Take 40 mg by mouth daily.    [provider]  oxyCODONE (ROXICODONE) 15 MG immediate release tablet Take 15 mg by mouth 3 (three) times daily. 03/15/17   [provider]  Oxycodone HCl 10 MG TABS Take 20 mg by mouth every 12 (twelve) hours.    [provider]  protein supplement shake (PREMIER PROTEIN) LIQD Take 11 oz by mouth daily with breakfast. CARAMEL OR STRAWBERRY PREFERRED    [provider]  traZODone (DESYREL) 100 MG tablet Take  1 tablet (100 mg total) by mouth at bedtime. 03/11/16  Cuthriell, Delorise RoyalsJonathan D, PA-C  vitamin E 1000 UNIT capsule Take 2,000 Units by mouth at bedtime.     [provider]  Zinc 50 MG TABS Take 100 mg by mouth daily at 2 PM.     [provider]      VITAL SIGNS:  Blood pressure (!) 149/97, pulse 77, temperature 98.2 F (36.8 C), temperature source Oral, resp. rate 16, height 5\' 8"  (1.727 m), weight 110.7 kg, SpO2 94 %.  PHYSICAL EXAMINATION:  Physical Exam  GENERAL:  68 y.o.-year-old patient lying in the bed with no acute distress.  EYES: Pupils equal, round, reactive to light and accommodation. No scleral icterus. Extraocular muscles intact.  HEENT: Head atraumatic, normocephalic. Oropharynx and nasopharynx clear.  NECK:  Supple, no jugular venous distention. No thyroid enlargement, no tenderness.  LUNGS: Normal breath sounds bilaterally, no wheezing, rales,rhonchi or crepitation. No use of accessory muscles of respiration.  CARDIOVASCULAR: RRR, S1, S2 normal. No murmurs, rubs, or gallops.  ABDOMEN: Soft, nontender, nondistended. Bowel sounds present. No organomegaly or mass.  EXTREMITIES: No pedal edema, cyanosis, or clubbing.  Right leg shortened and externally rotated. NEUROLOGIC: Cranial nerves II through XII are intact.  Decreased muscle strength in the right lower extremity secondary to pain.  No focal deficits. Sensation intact. Gait not checked.  PSYCHIATRIC: The patient is alert and oriented x 3.  SKIN: No obvious rash, lesion, or ulcer.   LABORATORY PANEL:   CBC Recent Labs  Lab 10/02/18 2252  WBC 7.5  HGB 12.8  HCT 37.7  PLT 276   ------------------------------------------------------------------------------------------------------------------  Chemistries  Recent Labs  Lab 10/02/18 2252  NA 121*  K 4.3  CL 85*  CO2 26  GLUCOSE 145*  BUN 13  CREATININE 0.73  CALCIUM 8.4*    ------------------------------------------------------------------------------------------------------------------  Cardiac Enzymes Recent Labs  Lab 10/02/18 2252  TROPONINI <0.03   ------------------------------------------------------------------------------------------------------------------  RADIOLOGY:  Dg Chest 1 View  Result Date: 10/02/2018 CLINICAL DATA:  c/o mechanical fall tonight over a rug and fell onto right side and on the ground approx 45 minutes, pain from right knee to right hip, denies hitting head or LOCpre-op EXAM: CHEST  1 VIEW COMPARISON:  None. FINDINGS: Normal cardiac silhouette. Low lung volumes. No effusion, infiltrate or pneumothorax. IMPRESSION: Low lung volumes.  No acute findings. Electronically Signed   By: Genevive BiStewart  Edmunds M.D.   On: 10/02/2018 23:35   Dg Hip Unilat  With Pelvis 2-3 Views Right  Result Date: 10/02/2018 CLINICAL DATA:  68 year old female with fall and right hip pain. EXAM: DG HIP (WITH OR WITHOUT PELVIS) 2-3V RIGHT COMPARISON:  CT of the abdomen pelvis dated 05/05/2018 FINDINGS: There is a mildly displaced fracture of the right femoral neck with mild varus angulation. No dislocation. There is advanced osteopenia. There is a left hip arthroplasty. The soft tissues are grossly unremarkable. IMPRESSION: Right femoral neck fracture. Electronically Signed   By: Elgie CollardArash  Radparvar M.D.   On: 10/02/2018 23:34      IMPRESSION AND PLAN:   Right femoral neck fracture- s/p mechanical fall. Patient requesting that Dr. Rosita KeaMenz perform her surgery, as he has done multiple orthopedic surgeries for her in the past. -Ortho consult -IV pain control -PT eval after surgery  Right elbow pain- patient fell on her right hip and right elbow. -Will obtain right elbow x-rays  Acute hyponatremia- Na 121 (baseline low 130s). 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  -Check urine sodium and urine  osmolality -IVFs -Recheck  sodium in the morning  Hypertension- normotensive in the ED -Continue ACE, coreg  Type 2 diabetes- takes metformin at home -Sensitive SSI  COPD- stable, no signs of acute exacerbation -Albuterol nebs prn  CAD- stable, no active chest pain -Continue aspirin, coreg, ACE  History of DVT/PE- previously on eliquis, but is no longer taking this. -Heparin for DVT prophylaxis  Depression/anxiety/bipolar disorder- stable -Continue cymbalta, zyprexa, trazodone, klonopin, depakote  Chronic pain syndrome -Continue home oxycodone 20 mg twice daily, will use Dilaudid IV for breakthrough pain -Continue home baclofen and gabapentin  All the records are reviewed and case discussed with ED provider. Management plans discussed with the patient, family and they are in agreement.  CODE STATUS: Full  TOTAL TIME TAKING CARE OF THIS PATIENT: 45 minutes.    Jinny Blossom Anyely Cunning M.D on 10/03/2018 at 12:13 AM  Between 7am to 6pm - Pager - [phone]  After 6pm go to [email or web address] - Social research officer, government  [facility]  Office  [phone]  CC: Primary care physician; Barbette Reichmann, MD   Note: This dictation was prepared with Dragon dictation along with smaller phrase technology. Any transcriptional errors that result from this process are unintentional.

## 2018-10-04 ENCOUNTER — Encounter
Admission: EM | Disposition: A | Discharge status: Skilled Nursing Facility | Payer: Self-pay | Source: Home / Self Care | Attending: Internal Medicine

## 2018-10-04 HISTORY — PX: HIP ARTHROPLASTY: SHX981

## 2018-10-04 LAB — SODIUM
Sodium: 120 mmol/L — ABNORMAL LOW (ref 135–145)
Sodium: 121 mmol/L — ABNORMAL LOW (ref 135–145)
Sodium: 120 mmol/L — ABNORMAL LOW (ref 135–145)
Sodium: 122 mmol/L — ABNORMAL LOW (ref 135–145)
Sodium: 123 mmol/L — ABNORMAL LOW (ref 135–145)

## 2018-10-04 LAB — BASIC METABOLIC PANEL
ANION GAP: 10 (ref 5–15)
Anion gap: 10 (ref 5–15)
BUN: 7 mg/dL — ABNORMAL LOW (ref 8–23)
BUN: 6 mg/dL — AB (ref 8–23)
CO2: 26 mmol/L (ref 22–32)
CO2: 27 mmol/L (ref 22–32)
Calcium: 7.8 mg/dL — ABNORMAL LOW (ref 8.9–10.3)
Calcium: 7.7 mg/dL — ABNORMAL LOW (ref 8.9–10.3)
Chloride: 85 mmol/L — ABNORMAL LOW (ref 98–111)
Chloride: 85 mmol/L — ABNORMAL LOW (ref 98–111)
Creatinine, Ser: 0.4 mg/dL — ABNORMAL LOW (ref 0.44–1.00)
Creatinine, Ser: 0.34 mg/dL — ABNORMAL LOW (ref 0.44–1.00)
GFR calc Af Amer: >60 mL/min (ref >60)
GFR calc Af Amer: >60 mL/min (ref >60)
GFR calc non Af Amer: >60 mL/min (ref >60)
GFR calc non Af Amer: >60 mL/min (ref >60)
Glucose, Bld: 174 mg/dL — ABNORMAL HIGH (ref 70–99)
Glucose, Bld: 164 mg/dL — ABNORMAL HIGH (ref 70–99)
Potassium: 3.3 mmol/L — ABNORMAL LOW (ref 3.5–5.1)
Potassium: 3.6 mmol/L (ref 3.5–5.1)
SODIUM: 122 mmol/L — AB (ref 135–145)
Sodium: 121 mmol/L — ABNORMAL LOW (ref 135–145)

## 2018-10-04 LAB — GLUCOSE, CAPILLARY
Glucose-Capillary: 176 mg/dL — ABNORMAL HIGH (ref 70–99)
Glucose-Capillary: 162 mg/dL — ABNORMAL HIGH (ref 70–99)
Glucose-Capillary: 144 mg/dL — ABNORMAL HIGH (ref 70–99)
Glucose-Capillary: 144 mg/dL — ABNORMAL HIGH (ref 70–99)

## 2018-10-04 LAB — HIV ANTIBODY (ROUTINE TESTING W REFLEX): HIV Screen 4th Generation wRfx: NONREACTIVE

## 2018-10-04 LAB — TYPE AND SCREEN
ABO/RH(D): A POS
Antibody Screen: NEGATIVE

## 2018-10-04 SURGERY — HEMIARTHROPLASTY, HIP, DIRECT ANTERIOR APPROACH, FOR FRACTURE
Anesthesia: Choice | Laterality: Right

## 2018-10-04 MED ORDER — POTASSIUM CHLORIDE 10 MEQ/100ML IV SOLN
10.0000 meq | INTRAVENOUS | Status: AC
  Administered 2018-10-04 (×4): 10 meq via INTRAVENOUS
  Filled 2018-10-04 (×4): qty 100

## 2018-10-04 MED ORDER — HEPARIN SODIUM (PORCINE) 5000 UNIT/ML IJ SOLN
5000.0000 [IU] | Freq: Three times a day (TID) | INTRAMUSCULAR | Status: DC
  Administered 2018-10-04 – 2018-10-05 (×4): 5000 [IU] via SUBCUTANEOUS
  Filled 2018-10-04 (×5): qty 1

## 2018-10-04 MED ORDER — TOLVAPTAN 15 MG PO TABS
15.0000 mg | ORAL_TABLET | ORAL | Status: DC
  Administered 2018-10-04: 15 mg via ORAL
  Filled 2018-10-04 (×2): qty 1

## 2018-10-04 MED ORDER — AMLODIPINE BESYLATE 5 MG PO TABS
5.0000 mg | ORAL_TABLET | Freq: Every day | ORAL | Status: DC
  Administered 2018-10-04 – 2018-10-08 (×4): 5 mg via ORAL
  Filled 2018-10-04 (×4): qty 1

## 2018-10-04 SURGICAL SUPPLY — 54 items
BAG DECANTER FOR FLEXI CONT (MISCELLANEOUS) ×2 IMPLANT
BLADE SAW 90X25X1.19 OSCILLAT (BLADE) ×2 IMPLANT
CANISTER SUCT 1200ML W/VALVE (MISCELLANEOUS) ×2 IMPLANT
CANISTER SUCT 3000ML PPV (MISCELLANEOUS) ×4 IMPLANT
COVER WAND RF STERILE (DRAPES) ×2 IMPLANT
DRAPE INCISE IOBAN 66X60 STRL (DRAPES) ×2 IMPLANT
DRAPE SHEET LG 3/4 BI-LAMINATE (DRAPES) ×2 IMPLANT
DRAPE TABLE BACK 80X90 (DRAPES) ×2 IMPLANT
DRSG DERMACEA 8X12 NADH (GAUZE/BANDAGES/DRESSINGS) ×2 IMPLANT
DRSG OPSITE POSTOP 4X12 (GAUZE/BANDAGES/DRESSINGS) ×2 IMPLANT
DRSG OPSITE POSTOP 4X14 (GAUZE/BANDAGES/DRESSINGS) ×2 IMPLANT
DRSG TEGADERM 4X4.75 (GAUZE/BANDAGES/DRESSINGS) ×2 IMPLANT
DURAPREP 26ML APPLICATOR (WOUND CARE) ×4 IMPLANT
ELECT BLADE 6.5 EXT (BLADE) ×2 IMPLANT
ELECT CAUTERY BLADE 6.4 (BLADE) ×2 IMPLANT
ELECT REM PT RETURN 9FT ADLT (ELECTROSURGICAL) ×2
ELECTRODE REM PT RTRN 9FT ADLT (ELECTROSURGICAL) ×1 IMPLANT
GAUZE PACK 2X3YD (GAUZE/BANDAGES/DRESSINGS) ×2 IMPLANT
GLOVE BIOGEL M STRL SZ7.5 (GLOVE) ×4 IMPLANT
GLOVE INDICATOR 8.0 STRL GRN (GLOVE) ×2 IMPLANT
GOWN STRL REUS W/ TWL LRG LVL3 (GOWN DISPOSABLE) ×2 IMPLANT
GOWN STRL REUS W/TWL LRG LVL3 (GOWN DISPOSABLE) ×2
HEMOVAC 400CC 10FR (MISCELLANEOUS) ×2 IMPLANT
HOLDER FOLEY CATH W/STRAP (MISCELLANEOUS) ×2 IMPLANT
HOOD PEEL AWAY FLYTE STAYCOOL (MISCELLANEOUS) ×4 IMPLANT
IV NS 100ML SINGLE PACK (IV SOLUTION) ×2 IMPLANT
KIT TURNOVER KIT A (KITS) ×2 IMPLANT
NDL SAFETY ECLIPSE 18X1.5 (NEEDLE) ×1 IMPLANT
NEEDLE FILTER BLUNT 18X 1/2SAF (NEEDLE) ×1
NEEDLE FILTER BLUNT 18X1 1/2 (NEEDLE) ×1 IMPLANT
NEEDLE HYPO 18GX1.5 SHARP (NEEDLE) ×1
NS IRRIG 1000ML POUR BTL (IV SOLUTION) ×2 IMPLANT
PACK HIP PROSTHESIS (MISCELLANEOUS) ×2 IMPLANT
PENCIL SMOKE ULTRAEVAC 22 CON (MISCELLANEOUS) ×2 IMPLANT
PRESSURIZER CEMENT PROX FEM SM (MISCELLANEOUS) ×2 IMPLANT
PRESSURIZER FEM CANAL M (MISCELLANEOUS) ×2 IMPLANT
PULSAVAC PLUS IRRIG FAN TIP (DISPOSABLE) ×2
SOL .9 NS 3000ML IRR  AL (IV SOLUTION) ×1
SOL .9 NS 3000ML IRR UROMATIC (IV SOLUTION) ×1 IMPLANT
SOL PREP PVP 2OZ (MISCELLANEOUS) ×2
SOLUTION PREP PVP 2OZ (MISCELLANEOUS) ×1 IMPLANT
SPONGE DRAIN TRACH 4X4 STRL 2S (GAUZE/BANDAGES/DRESSINGS) ×2 IMPLANT
STAPLER SKIN PROX 35W (STAPLE) ×2 IMPLANT
SUT ETHIBOND #5 BRAIDED 30INL (SUTURE) ×2 IMPLANT
SUT VIC AB 0 CT1 36 (SUTURE) ×2 IMPLANT
SUT VIC AB 1 CT1 36 (SUTURE) ×4 IMPLANT
SUT VIC AB 2-0 CT1 27 (SUTURE) ×1
SUT VIC AB 2-0 CT1 TAPERPNT 27 (SUTURE) ×1 IMPLANT
SYR 20CC LL (SYRINGE) ×2 IMPLANT
SYR TB 1ML 27GX1/2 LL (SYRINGE) ×2 IMPLANT
TAPE TRANSPORE STRL 2 31045 (GAUZE/BANDAGES/DRESSINGS) ×2 IMPLANT
TIP BRUSH PULSAVAC PLUS 24.33 (MISCELLANEOUS) ×2 IMPLANT
TIP FAN IRRIG PULSAVAC PLUS (DISPOSABLE) ×1 IMPLANT
TOWER CARTRIDGE SMART MIX (DISPOSABLE) ×2 IMPLANT

## 2018-10-04 NOTE — Progress Notes (Signed)
ORTHOPAEDICS PROGRESS NOTE  PATIENT NAME: Amy Sweeney DOB: [birth year]  MRN: [id number]  Subjective: Patient focused on pain, despite being asleep when I came into the room.  Objective: Vital signs in last 24 hours: Temp:  [97.4 F (36.3 C)-98.6 F (37 C)] 97.4 F (36.3 C) (01/21 0813) Pulse Rate:  [83-101] 93 (01/21 1610) Resp:  [13-16] 16 (01/21 1610) BP: (107-188)/(64-106) 167/98 (01/21 1610) SpO2:  [93 %-95 %] 93 % (01/21 1610)  Intake/Output from previous day: 01/20 0701 - 01/21 0700 In: 240 [P.O.:240] Out: 5200 [Urine:5200]  Recent Labs    10/02/18 2252 10/03/18 0253  10/04/18 0445 10/04/18 1308  WBC 7.5 11.5*  --   --   --   HGB 12.8 12.7  --   --   --   HCT 37.7 38.1  --   --   --   PLT 276 275  --   --   --   K 4.3 3.9   < > 3.3* 3.6  CL 85* 86*   < > 85* 85*  CO2 26 31   < > 26 27  BUN 13 13   < > 7* 6*  CREATININE 0.73 0.58   < > 0.40* 0.34*  GLUCOSE 145* 156*   < > 174* 164*  CALCIUM 8.4* 8.3*   < > 7.8* 7.7*   < > = values in this interval not displayed.    EXAM General: WDWN female Right lower extremity: shortened and slightly rotated; pain with log rolling. Neurologic: Awake, alert, and oriented. Sensory and motor function are grossly intact.   Assessment: Displaced right femoral neck fracture   Plan: Appreciate Medicine and Nephrology input.  Surgery canceled today due to persistent hyponatremia despite Nephrology's intervention. Anesthesiology has requested sodium to be at least 125 prior to any surgical intervention.   Consider DVT prophylaxis until sodium normalizes (? subcut heparin).  Will continue to follow.  James P. Angie Fava M.D.

## 2018-10-04 NOTE — Progress Notes (Signed)
Subjective :Patient is due to fracture hip.  Surgery planned for later today No change in condition. Patient reports pain as severe.  Patient currently being seen at [facility] pain clinic for chronic back pain Patient has been seen by anesthesia as well as nephrology secondary to hyponatremia.  Sodium now up to 121.  Anesthesia would like to have 125.  Patient currently receiving sodium tablets as well as fluid restrictions  Objective: Vital signs in last 24 hours: Temp:  [97.7 F (36.5 C)-99 F (37.2 C)] 98.6 F (37 C) (01/20 2256) Pulse Rate:  [80-97] 83 (01/20 2256) Resp:  [13] 13 (01/20 2256) BP: (107-181)/(64-102) 107/64 (01/20 2256) SpO2:  [94 %-96 %] 94 % (01/20 2256)   Intake/Output from previous day: 01/20 0701 - 01/21 0700 In: 240 [P.O.:240] Out: 5200 [Urine:5200] Intake/Output this shift: No intake/output data recorded.  Recent Labs    10/02/18 2252 10/03/18 0253  HGB 12.8 12.7   Recent Labs    10/02/18 2252 10/03/18 0253  WBC 7.5 11.5*  RBC 4.16 4.10  HCT 37.7 38.1  PLT 276 275   Recent Labs    10/03/18 0945  10/04/18 0107 10/04/18 0445  NA 122*   < > 120* 121*  K 3.7  --   --  3.3*  CL 86*  --   --  85*  CO2 27  --   --  26  BUN 9  --   --  7*  CREATININE 0.55  --   --  0.40*  GLUCOSE 164*  --   --  174*  CALCIUM 8.0*  --   --  7.8*   < > = values in this interval not displayed.   No results for input(s): LABPT, INR in the last 72 hours.  Neurologically intact Neurovascular intact Sensation intact distally Intact pulses distally Compartment soft  Assessment/Plan: N.p.o.  Surgery scheduled for later today Case management to assist with discharge planning Labs in am Plan to discharge Thursday   Lynnda Shields [facility] 10/04/2018, 8:02 AM

## 2018-10-04 NOTE — Progress Notes (Signed)
[facility] [facility] [place] [facility]   PATIENT NAME: Amy Sweeney    MR#:  [id number]  DATE OF BIRTH:  Nov 04, 1950  SUBJECTIVE:  CHIEF COMPLAINT:   Chief Complaint  Patient presents with  . Fall  . Hip Pain   -Appears much more comfortable today.  Pain medications have been adjusted yesterday. -Sodium still low at 121  REVIEW OF SYSTEMS:  Review of Systems  Constitutional: Negative for chills, fever and malaise/fatigue.  HENT: Negative for congestion, ear discharge, hearing loss and nosebleeds.   Eyes: Negative for blurred vision and double vision.  Respiratory: Negative for cough, shortness of breath and wheezing.   Cardiovascular: Negative for chest pain and palpitations.  Gastrointestinal: Negative for abdominal pain, constipation, diarrhea, nausea and vomiting.  Genitourinary: Negative for dysuria.  Musculoskeletal: Positive for back pain, falls, joint pain and myalgias.  Neurological: Negative for dizziness, focal weakness, seizures, weakness and headaches.  Psychiatric/Behavioral: Negative for depression.    DRUG ALLERGIES:   Allergies  Allergen Reactions  . Bee Venom Anaphylaxis  . Morphine And Related Anaphylaxis    Blisters itching, N&V, face and throat swelling  . Adhesive [Tape] Other (See Comments)    Itching and water blisters. Paper tape is OK.  . Celebrex [Celecoxib] Swelling  . Tegaderm Ag Mesh [Silver] Other (See Comments)    itching and water blisters itching and water blisters  . Grape Seed Itching and Swelling    Lips and mouth swelling/itching.  Grapes not grape seed  . Latuda [Lurasidone Hcl] Other (See Comments)  . Tapentadol Other (See Comments)    VITALS:  Blood pressure (!) 160/85, pulse 90, temperature (!) 97.4 F (36.3 C), resp. rate 13, height 5\' 8"  (1.727 m), weight 110.7 kg, SpO2 95 %.  PHYSICAL EXAMINATION:  Physical Exam   GENERAL:  68 y.o.-year-old obese patient lying in the bed, appears comfortable  today EYES: Pupils equal, round, reactive to light and accommodation. No scleral icterus. Extraocular muscles intact.  HEENT: Head atraumatic, normocephalic. Oropharynx and nasopharynx clear.  NECK:  Supple, no jugular venous distention. No thyroid enlargement, no tenderness.  LUNGS: Normal breath sounds bilaterally, no wheezing, rales,rhonchi or crepitation. No use of accessory muscles of respiration.  Decreased bibasilar breath sounds CARDIOVASCULAR: S1, S2 normal. No murmurs, rubs, or gallops.  ABDOMEN: Soft, obese, nontender, nondistended. Bowel sounds present. No organomegaly or mass.  EXTREMITIES: Right leg is abducted and externally rotated.  No pedal edema, cyanosis, or clubbing.  NEUROLOGIC: Cranial nerves II through XII are intact. Muscle strength 5/5 in all extremities. Sensation intact. Gait not checked.  PSYCHIATRIC: The patient is alert and oriented x 3.  SKIN: No obvious rash, lesion, or ulcer.    LABORATORY PANEL:   CBC Recent Labs  Lab 10/03/18 0253  WBC 11.5*  HGB 12.7  HCT 38.1  PLT 275   ------------------------------------------------------------------------------------------------------------------  Chemistries  Recent Labs  Lab 10/04/18 0445  10/04/18 1017  NA 121*   < > 120*  K 3.3*  --   --   CL 85*  --   --   CO2 26  --   --   GLUCOSE 174*  --   --   BUN 7*  --   --   CREATININE 0.40*  --   --   CALCIUM 7.8*  --   --    < > = values in this interval not displayed.   ------------------------------------------------------------------------------------------------------------------  Cardiac Enzymes Recent Labs  Lab 10/02/18 2252  TROPONINI <0.03   ------------------------------------------------------------------------------------------------------------------  RADIOLOGY:  Dg Chest 1 View  Result Date: 10/02/2018 CLINICAL DATA:  c/o mechanical fall tonight over a rug and fell onto right side and on the ground approx 45 minutes, pain from  right knee to right hip, denies hitting head or LOCpre-op EXAM: CHEST  1 VIEW COMPARISON:  None. FINDINGS: Normal cardiac silhouette. Low lung volumes. No effusion, infiltrate or pneumothorax. IMPRESSION: Low lung volumes.  No acute findings. Electronically Signed   By: Genevive Bi M.D.   On: 10/02/2018 23:35   Dg Elbow 2 Views Right  Result Date: 10/03/2018 CLINICAL DATA:  68 year old female with fall and right elbow pain. EXAM: RIGHT ELBOW - 2 VIEW COMPARISON:  None. FINDINGS: There is no acute fracture or dislocation. There is mild osteopenia. No joint effusion. The soft tissues are unremarkable. IMPRESSION: No acute fracture or dislocation. Electronically Signed   By: Elgie Collard M.D.   On: 10/03/2018 01:19   Dg Hip Unilat  With Pelvis 2-3 Views Right  Result Date: 10/02/2018 CLINICAL DATA:  68 year old female with fall and right hip pain. EXAM: DG HIP (WITH OR WITHOUT PELVIS) 2-3V RIGHT COMPARISON:  CT of the abdomen pelvis dated 05/05/2018 FINDINGS: There is a mildly displaced fracture of the right femoral neck with mild varus angulation. No dislocation. There is advanced osteopenia. There is a left hip arthroplasty. The soft tissues are grossly unremarkable. IMPRESSION: Right femoral neck fracture. Electronically Signed   By: Elgie Collard M.D.   On: 10/02/2018 23:34    EKG:   Orders placed or performed during the hospital encounter of 10/02/18  . EKG 12-Lead  . EKG 12-Lead  . ED EKG  . ED EKG    ASSESSMENT AND PLAN:   68 year old female with multiple medical problems including degenerative disc disease, chronic back pain following with pain management clinic, COPD, CAD, bipolar with depression and anxiety presents to hospital secondary to fall and right hip pain.  1.  Mechanical fall and right femoral neck fracture-complains of significant pain -Takes chronic pain medications at home and so tolerant to higher pain medication doses -Appreciate Ortho consult.  Possible  surgery once sodium is improved to 125 -Adjust medications for better pain control -Physical therapy after surgery  2.  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  Appreciate nephrology consult -Fluid restriction and salt tablets started yesterday.  Sodium still at 120.  DC salt tablets and start tolvaptan today -Continue to monitor.  Patient does not have any neurological signs and symptoms of hyponatremia at this time  3.  Chronic low back pain-follows with [facility] pain management clinic.  Restarted her OxyContin and oxycodone. -Continue other pain medications and muscle relaxants  4.  Hypertension-on Avapro and Coreg  5.  DVT prophylaxis-will be started after surgery  Updated husband at bedside as well   All the records are reviewed and case discussed with Care Management/Social Workerr. Management plans discussed with the patient, family and they are in agreement.  CODE STATUS: Full Code  TOTAL TIME TAKING CARE OF THIS PATIENT: 37 minutes.   POSSIBLE D/C IN 3 DAYS, DEPENDING ON CLINICAL CONDITION.   Enid Baas M.D on 10/04/2018 at 10:43 AM  Between 7am to 6pm - Pager - [phone]  After 6pm go to [email or web address] - Social research officer, government  [facility] [place] [facility]  Office  [phone]  CC: Primary care physician; Barbette Reichmann, MD

## 2018-10-04 NOTE — Clinical Social Work Placement (Signed)
   CLINICAL SOCIAL WORK PLACEMENT  NOTE  Date:  10/04/2018  Patient Details  Name: Amy Sweeney MRN: [id number] Date of Birth: 03-20-1951  Clinical Social Work is seeking post-discharge placement for this patient at the Skilled  Nursing Facility level of care (*CSW will initial, date and re-position this form in  chart as items are completed):  Yes   Patient/family provided with [place] Clinical Social Work Department's list of facilities offering this level of care within the geographic area requested by the patient (or if unable, by the patient's family).  Yes   Patient/family informed of their freedom to choose among providers that offer the needed level of care, that participate in Medicare, Medicaid or managed care program needed by the patient, have an available bed and are willing to accept the patient.  Yes   Patient/family informed of [facility]'s ownership interest in [facility] and [facility], as well as of the fact that they are under no obligation to receive care at these facilities.  PASRR submitted to EDS on 10/03/18     PASRR number received on 10/04/18     Existing PASRR number confirmed on       FL2 transmitted to all facilities in geographic area requested by pt/family on 10/03/18     FL2 transmitted to all facilities within larger geographic area on       Patient informed that his/her managed care company has contracts with or will negotiate with certain facilities, including the following:        Yes   Patient/family informed of bed offers received.  Patient chooses bed at ([facility] )     Physician recommends and patient chooses bed at      Patient to be transferred to   on  .  Patient to be transferred to facility by       Patient family notified on   of transfer.  Name of family member notified:        PHYSICIAN       Additional Comment:    _______________________________________________ Arath Kaigler, Darleen CrockerBailey M, LCSW 10/04/2018, 3:18  PM

## 2018-10-04 NOTE — Progress Notes (Signed)
[facility]  ROUNDING NOTE   Subjective:   Na 120. Surgery held due to hyponatremia Tolvaptan given today at 11:11am Husband at bedside.   Patient states her pain is 9/10.   Objective:  Vital signs in last 24 hours:  Temp:  [97.4 F (36.3 C)-98.6 F (37 C)] 97.4 F (36.3 C) (01/21 0813) Pulse Rate:  [80-101] 90 (01/21 0933) Resp:  [13] 13 (01/20 2256) BP: (107-188)/(64-106) 160/85 (01/21 0933) SpO2:  [94 %-96 %] 95 % (01/21 0813)  Weight change:  Filed Weights   10/02/18 2247  Weight: 110.7 kg    Intake/Output: I/O last 3 completed shifts: In: 442.3 [P.O.:270; I.V.:172.3] Out: 9350 [Urine:9350]   Intake/Output this shift:  No intake/output data recorded.  Physical Exam: General: NAD,   Head: Normocephalic, atraumatic. Moist oral mucosal membranes  Eyes: Anicteric, PERRL  Neck: Supple, trachea midline  Lungs:  Clear to auscultation  Heart: Regular rate and rhythm  Abdomen:  Soft, nontender,   Extremities: No peripheral edema.  Neurologic: Nonfocal, moving all four extremities  Skin: No lesions        Basic Metabolic Panel: Recent Labs  Lab 10/02/18 2252 10/03/18 0253 10/03/18 0945  10/03/18 2048 10/04/18 0107 10/04/18 0445 10/04/18 0845 10/04/18 1017  NA 121* 124* 122*   < > 120* 120* 121* 121* 120*  K 4.3 3.9 3.7  --   --   --  3.3*  --   --   CL 85* 86* 86*  --   --   --  85*  --   --   CO2 26 31 27   --   --   --  26  --   --   GLUCOSE 145* 156* 164*  --   --   --  174*  --   --   BUN 13 13 9   --   --   --  7*  --   --   CREATININE 0.73 0.58 0.55  --   --   --  0.40*  --   --   CALCIUM 8.4* 8.3* 8.0*  --   --   --  7.8*  --   --    < > = values in this interval not displayed.    Liver Function Tests: No results for input(s): AST, ALT, ALKPHOS, BILITOT, PROT, ALBUMIN in the last 168 hours. No results for input(s): LIPASE, AMYLASE in the last 168 hours. No results for input(s): AMMONIA in the last 168 hours.  CBC: Recent Labs   Lab 10/02/18 2252 10/03/18 0253  WBC 7.5 11.5*  NEUTROABS 4.4  --   HGB 12.8 12.7  HCT 37.7 38.1  MCV 90.6 92.9  PLT 276 275    Cardiac Enzymes: Recent Labs  Lab 10/02/18 2252  TROPONINI <0.03    BNP: Invalid input(s): POCBNP  CBG: Recent Labs  Lab 10/03/18 1157 10/03/18 1637 10/03/18 2050 10/04/18 0814 10/04/18 1152  GLUCAP 159* 168* 147* 176* 162*    Microbiology: Results for orders placed or performed during the hospital encounter of 10/02/18  Surgical pcr screen     Status: None   Collection Time: 10/03/18  1:48 AM  Result Value Ref Range Status   MRSA, PCR NEGATIVE NEGATIVE Final   Staphylococcus aureus NEGATIVE NEGATIVE Final    Comment: (NOTE) The Xpert SA Assay (FDA approved for NASAL specimens in patients 66 years of age and older), is one component of a comprehensive surveillance program. It is not intended to diagnose  infection nor to guide or monitor treatment. Performed at [facility], [address] [place], [place] [id number]     Coagulation Studies: No results for input(s): LABPROT, INR in the last 72 hours.  Urinalysis: No results for input(s): COLORURINE, LABSPEC, PHURINE, GLUCOSEU, HGBUR, BILIRUBINUR, KETONESUR, PROTEINUR, UROBILINOGEN, NITRITE, LEUKOCYTESUR in the last 72 hours.  Invalid input(s): APPERANCEUR    Imaging: Dg Chest 1 View  Result Date: 10/02/2018 CLINICAL DATA:  c/o mechanical fall tonight over a rug and fell onto right side and on the ground approx 45 minutes, pain from right knee to right hip, denies hitting head or LOCpre-op EXAM: CHEST  1 VIEW COMPARISON:  None. FINDINGS: Normal cardiac silhouette. Low lung volumes. No effusion, infiltrate or pneumothorax. IMPRESSION: Low lung volumes.  No acute findings. Electronically Signed   By: Genevive BiStewart  Edmunds M.D.   On: 10/02/2018 23:35   Dg Elbow 2 Views Right  Result Date: 10/03/2018 CLINICAL DATA:  68 year old female with fall and right elbow pain. EXAM:  RIGHT ELBOW - 2 VIEW COMPARISON:  None. FINDINGS: There is no acute fracture or dislocation. There is mild osteopenia. No joint effusion. The soft tissues are unremarkable. IMPRESSION: No acute fracture or dislocation. Electronically Signed   By: Elgie CollardArash  Radparvar M.D.   On: 10/03/2018 01:19   Dg Hip Unilat  With Pelvis 2-3 Views Right  Result Date: 10/02/2018 CLINICAL DATA:  68 year old female with fall and right hip pain. EXAM: DG HIP (WITH OR WITHOUT PELVIS) 2-3V RIGHT COMPARISON:  CT of the abdomen pelvis dated 05/05/2018 FINDINGS: There is a mildly displaced fracture of the right femoral neck with mild varus angulation. No dislocation. There is advanced osteopenia. There is a left hip arthroplasty. The soft tissues are grossly unremarkable. IMPRESSION: Right femoral neck fracture. Electronically Signed   By: Elgie CollardArash  Radparvar M.D.   On: 10/02/2018 23:34     Medications:   .  ceFAZolin (ANCEF) IV    . potassium chloride 10 mEq (10/04/18 1118)   . baclofen  5 mg Oral BID  . carvedilol  12.5 mg Oral BID WC  . clonazePAM  0.5 mg Oral BID  . diphenhydrAMINE  50-100 mg Oral QHS  . divalproex  1,500 mg Oral Daily  . DULoxetine  120 mg Oral Daily  . ferrous sulfate  650 mg Oral Q1400  . fluticasone  1 spray Each Nare BID  . gabapentin  1,200 mg Oral TID  . insulin aspart  0-5 Units Subcutaneous QHS  . insulin aspart  0-9 Units Subcutaneous TID WC  . irbesartan  300 mg Oral Daily  . mouth rinse  15 mL Mouth Rinse BID  . OLANZapine  2.5 mg Oral QHS  . oxyCODONE  20 mg Oral Q12H  . tolvaptan  15 mg Oral Q24H  . traZODone  100 mg Oral QHS  . vitamin B-12  5,000 mcg Oral Q1400   acetaminophen **OR** acetaminophen, albuterol, hydrALAZINE, HYDROmorphone (DILAUDID) injection, Melatonin, ondansetron **OR** ondansetron (ZOFRAN) IV, oxyCODONE, polyethylene glycol  Assessment/ Plan:  Ms. Eliseo GumRebecca G Eggleton is a 68 y.o. white female with hypertension, diabetes mellitus type II, bipolar disorder,  coronary artery disease, COPD, gastric bypass, chronic pain on chronic narcotics , who was [facility] to [facility] on 10/02/2018 for right hip fracture  1. Hyponatremia: acute on chronic hyponatremia. Patient has history of polydipsia. Labs are most consistent with this.  - tolvaptan 15mg  PO daily.  - serial sodium checks  2. Hypertension: elevated. Patient states this is because her pain  is not well controlled.  Carvedilol and irbesartan. - restart amlodipine  3. 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 On metformin as outpatient.  Hyperglycemia can cause dry mouth and polydipsia.  Hemoglobin A1c of 5.9%.    LOS: 1 Sherylann Vangorden 1/21/202012:14 PM

## 2018-10-04 NOTE — Progress Notes (Signed)
Clinical Social Worker (CSW) met with patient and her husband Eddie Dibbles was at bedside. CSW presented SNF bed offers and discussed quality measures of the facilities. Patient and husband chose [facility]. [facility] liaison is aware of accepted bed offer.   [employer], LCSW [phone]

## 2018-10-04 NOTE — Progress Notes (Signed)
MD Hooten notified of patients sodium level of 120. MD checked with anesthesiologist and was told to keep pt NPO until sodium recheck. Pt educated and agreeable.

## 2018-10-04 NOTE — Progress Notes (Signed)
Patient sodium 120. MD notified. Nephro consulted and patient receiving sodium tablet TID. And fluid restriction previously ordered.

## 2018-10-05 ENCOUNTER — Encounter: Payer: Self-pay | Admitting: Orthopedic Surgery

## 2018-10-05 LAB — URINALYSIS, COMPLETE (UACMP) WITH MICROSCOPIC
Bilirubin Urine: NEGATIVE
Glucose, UA: NEGATIVE mg/dL
Ketones, ur: 5 mg/dL — AB
Nitrite: NEGATIVE
Protein, ur: NEGATIVE mg/dL
SQUAMOUS EPITHELIAL / LPF: NONE SEEN (ref 0–5)
Specific Gravity, Urine: 1.003 — ABNORMAL LOW (ref 1.005–1.030)
pH: 6 (ref 5.0–8.0)

## 2018-10-05 LAB — BASIC METABOLIC PANEL
Anion gap: 9 (ref 5–15)
BUN: 8 mg/dL (ref 8–23)
CO2: 28 mmol/L (ref 22–32)
Calcium: 7.7 mg/dL — ABNORMAL LOW (ref 8.9–10.3)
Chloride: 90 mmol/L — ABNORMAL LOW (ref 98–111)
Creatinine, Ser: 0.5 mg/dL (ref 0.44–1.00)
GFR calc non Af Amer: >60 mL/min (ref >60)
Glucose, Bld: 153 mg/dL — ABNORMAL HIGH (ref 70–99)
Potassium: 3.5 mmol/L (ref 3.5–5.1)
Sodium: 127 mmol/L — ABNORMAL LOW (ref 135–145)

## 2018-10-05 LAB — GLUCOSE, CAPILLARY
Glucose-Capillary: 160 mg/dL — ABNORMAL HIGH (ref 70–99)
Glucose-Capillary: 148 mg/dL — ABNORMAL HIGH (ref 70–99)
Glucose-Capillary: 122 mg/dL — ABNORMAL HIGH (ref 70–99)
Glucose-Capillary: 158 mg/dL — ABNORMAL HIGH (ref 70–99)

## 2018-10-05 LAB — SODIUM: Sodium: 128 mmol/L — ABNORMAL LOW (ref 135–145)

## 2018-10-05 MED ORDER — NYSTATIN 100000 UNIT/GM EX POWD
Freq: Two times a day (BID) | CUTANEOUS | Status: DC
  Administered 2018-10-05 – 2018-10-08 (×5): via TOPICAL
  Filled 2018-10-05: qty 15

## 2018-10-05 MED ORDER — LATANOPROST 0.005 % OP SOLN
1.0000 [drp] | Freq: Every day | OPHTHALMIC | Status: DC
  Administered 2018-10-05 – 2018-10-07 (×2): 1 [drp] via OPHTHALMIC
  Filled 2018-10-05: qty 2.5

## 2018-10-05 MED ORDER — NON FORMULARY: 1.0000 [drp] | Freq: Every day | Status: DC

## 2018-10-05 MED ORDER — TOLVAPTAN 15 MG PO TABS
15.0000 mg | ORAL_TABLET | ORAL | Status: DC
  Administered 2018-10-05: 15 mg via ORAL
  Filled 2018-10-05 (×2): qty 1

## 2018-10-05 NOTE — Progress Notes (Signed)
Subjective :Patient is day 3 hip fracture Patient reports pain as severe.   no nausea and no vomiting No change in her status.  She continues to complain of pain all the time.  Sodium is up to 127.  Objective: Vital signs in last 24 hours: Temp:  [98.1 F (36.7 C)-99 F (37.2 C)] 98.1 F (36.7 C) (01/22 0734) Pulse Rate:  [87-93] 87 (01/22 0734) Resp:  [16-18] 17 (01/22 0734) BP: (123-167)/(78-98) 123/78 (01/22 0734) SpO2:  [92 %-97 %] 94 % (01/22 0734)   Intake/Output from previous day: 01/21 0701 - 01/22 0700 In: 374.3 [IV Piggyback:374.3] Out: 3475 [Urine:3475] Intake/Output this shift: No intake/output data recorded.  Recent Labs    10/02/18 2252 10/03/18 0253  HGB 12.8 12.7   Recent Labs    10/02/18 2252 10/03/18 0253  WBC 7.5 11.5*  RBC 4.16 4.10  HCT 37.7 38.1  PLT 276 275   Recent Labs    10/04/18 1308  10/04/18 1753 10/05/18 0159  NA 122*   < > 123* 127*  K 3.6  --   --  3.5  CL 85*  --   --  90*  CO2 27  --   --  28  BUN 6*  --   --  8  CREATININE 0.34*  --   --  0.50  GLUCOSE 164*  --   --  153*  CALCIUM 7.7*  --   --  7.7*   < > = values in this interval not displayed.   No results for input(s): LABPT, INR in the last 72 hours.  Neurologically intact Neurovascular intact Sensation intact distally Intact pulses distally Compartment soft  Assessment/Plan: Patient may resume diet for now. Surgery pending possibly Thursday or Friday. Case management to assist with discharge planning Labs in am Plan to discharge when medically cleared after surgery Patient started on low-dose heparin No change in narcotics   Tera Partridge 10/05/2018, 8:22 AM

## 2018-10-05 NOTE — Progress Notes (Signed)
[facility]  ROUNDING NOTE   Subjective:   Na 127 Tolvaptan given today and yesterday  Objective:  Vital signs in last 24 hours:  Temp:  [98.1 F (36.7 C)-99 F (37.2 C)] 98.1 F (36.7 C) (01/22 0734) Pulse Rate:  [87-93] 87 (01/22 0734) Resp:  [16-18] 17 (01/22 0734) BP: (123-167)/(78-98) 123/78 (01/22 0734) SpO2:  [92 %-97 %] 94 % (01/22 0734)  Weight change:  Filed Weights   10/02/18 2247  Weight: 110.7 kg    Intake/Output: I/O last 3 completed shifts: In: 374.3 [IV Piggyback:374.3] Out: 4975 [Urine:4975]   Intake/Output this shift:  No intake/output data recorded.  Physical Exam: General: NAD,   Head: Normocephalic, atraumatic. Moist oral mucosal membranes  Eyes: Anicteric, PERRL  Neck: Supple, trachea midline  Lungs:  Clear to auscultation  Heart: Regular rate and rhythm  Abdomen:  Soft, nontender,   Extremities: No peripheral edema.  Neurologic: Nonfocal, moving all four extremities  Skin: No lesions        Basic Metabolic Panel: Recent Labs  Lab 10/03/18 0253 10/03/18 0945  10/04/18 0445  10/04/18 1308 10/04/18 1407 10/04/18 1753 10/05/18 0159 10/05/18 1014  NA 124* 122*   < > 121*   < > 122* 122* 123* 127* 128*  K 3.9 3.7  --  3.3*  --  3.6  --   --  3.5  --   CL 86* 86*  --  85*  --  85*  --   --  90*  --   CO2 31 27  --  26  --  27  --   --  28  --   GLUCOSE 156* 164*  --  174*  --  164*  --   --  153*  --   BUN 13 9  --  7*  --  6*  --   --  8  --   CREATININE 0.58 0.55  --  0.40*  --  0.34*  --   --  0.50  --   CALCIUM 8.3* 8.0*  --  7.8*  --  7.7*  --   --  7.7*  --    < > = values in this interval not displayed.    Liver Function Tests: No results for input(s): AST, ALT, ALKPHOS, BILITOT, PROT, ALBUMIN in the last 168 hours. No results for input(s): LIPASE, AMYLASE in the last 168 hours. No results for input(s): AMMONIA in the last 168 hours.  CBC: Recent Labs  Lab 10/02/18 2252 10/03/18 0253  WBC 7.5 11.5*   NEUTROABS 4.4  --   HGB 12.8 12.7  HCT 37.7 38.1  MCV 90.6 92.9  PLT 276 275    Cardiac Enzymes: Recent Labs  Lab 10/02/18 2252  TROPONINI <0.03    BNP: Invalid input(s): POCBNP  CBG: Recent Labs  Lab 10/04/18 1152 10/04/18 1655 10/04/18 2102 10/05/18 0736 10/05/18 1138  GLUCAP 162* 144* 144* 160* 148*    Microbiology: Results for orders placed or performed during the hospital encounter of 10/02/18  Surgical pcr screen     Status: None   Collection Time: 10/03/18  1:48 AM  Result Value Ref Range Status   MRSA, PCR NEGATIVE NEGATIVE Final   Staphylococcus aureus NEGATIVE NEGATIVE Final    Comment: (NOTE) The Xpert SA Assay (FDA approved for NASAL specimens in patients 66 years of age and older), is one component of a comprehensive surveillance program. It is not intended to diagnose infection nor to guide or  monitor treatment. Performed at [facility], [address] [place], [place] [address]     Coagulation Studies: No results for input(s): LABPROT, INR in the last 72 hours.  Urinalysis: No results for input(s): COLORURINE, LABSPEC, PHURINE, GLUCOSEU, HGBUR, BILIRUBINUR, KETONESUR, PROTEINUR, UROBILINOGEN, NITRITE, LEUKOCYTESUR in the last 72 hours.  Invalid input(s): APPERANCEUR    Imaging: No results found.   Medications:    . amLODipine  5 mg Oral Daily  . baclofen  5 mg Oral BID  . carvedilol  12.5 mg Oral BID WC  . clonazePAM  0.5 mg Oral BID  . diphenhydrAMINE  50-100 mg Oral QHS  . divalproex  1,500 mg Oral Daily  . DULoxetine  120 mg Oral Daily  . ferrous sulfate  650 mg Oral Q1400  . fluticasone  1 spray Each Nare BID  . gabapentin  1,200 mg Oral TID  . heparin injection (subcutaneous)  5,000 Units Subcutaneous Q8H  . insulin aspart  0-5 Units Subcutaneous QHS  . insulin aspart  0-9 Units Subcutaneous TID WC  . irbesartan  300 mg Oral Daily  . mouth rinse  15 mL Mouth Rinse BID  . OLANZapine  2.5 mg Oral QHS  .  oxyCODONE  20 mg Oral Q12H  . tolvaptan  15 mg Oral Q24H  . traZODone  100 mg Oral QHS  . vitamin B-12  5,000 mcg Oral Q1400   acetaminophen **OR** acetaminophen, albuterol, hydrALAZINE, HYDROmorphone (DILAUDID) injection, Melatonin, ondansetron **OR** ondansetron (ZOFRAN) IV, oxyCODONE, polyethylene glycol  Assessment/ Plan:  [name] is a 68 y.o. white female with hypertension, diabetes mellitus type II, bipolar disorder, coronary artery disease, COPD, gastric bypass, chronic pain on chronic narcotics , who was admitted to [facility] on 10/02/2018 for right hip fracture  1. Hyponatremia: acute on chronic hyponatremia. Patient has history of polydipsia. Labs are most consistent with this.  - tolvaptan 15mg  PO daily.  - serial sodium checks  2. Hypertension: 123/78. Patient states this is because her pain is not well controlled.  Carvedilol, amlodipine and irbesartan.  3. 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 On metformin as outpatient.  Hyperglycemia can cause dry mouth and polydipsia.  Hemoglobin A1c of 5.9%.    LOS: 2 Amy Sweeney 1/22/202011:52 AM

## 2018-10-05 NOTE — Progress Notes (Signed)
Verbal order received by Van Clines, PA to d/c NPO diet and change to carb mod, no surgery today. Order placed.

## 2018-10-05 NOTE — Progress Notes (Signed)
Pt complains of pelvic pain and pain at foley site. Foley care completed. Pt does have a foul oder and greenish discharge. Spoke with MD on call Dr. Allena Katz.  Orders recd. Will cont to monitor pt.

## 2018-10-05 NOTE — Progress Notes (Addendum)
[facility] - [place] at [facility]   PATIENT NAME: Amy Sweeney    MR#:  [id number]  DATE OF BIRTH:  1951-06-29  SUBJECTIVE:   States she is doing okay this morning.  Still in pretty significant "full body pain".  No chest pain or shortness of breath.  REVIEW OF SYSTEMS:  Review of Systems  Constitutional: Negative for chills, fever and malaise/fatigue.  HENT: Negative for congestion, ear discharge, hearing loss and nosebleeds.   Eyes: Negative for blurred vision and double vision.  Respiratory: Negative for cough, shortness of breath and wheezing.   Cardiovascular: Negative for chest pain and palpitations.  Gastrointestinal: Negative for abdominal pain, constipation, diarrhea, nausea and vomiting.  Genitourinary: Negative for dysuria.  Musculoskeletal: Positive for back pain, falls, joint pain and myalgias.  Neurological: Negative for dizziness, focal weakness, seizures, weakness and headaches.  Psychiatric/Behavioral: Negative for depression.    DRUG ALLERGIES:   Allergies  Allergen Reactions  . Bee Venom Anaphylaxis  . Morphine And Related Anaphylaxis    Blisters itching, N&V, face and throat swelling  . Adhesive [Tape] Other (See Comments)    Itching and water blisters. Paper tape is OK.  . Celebrex [Celecoxib] Swelling  . Tegaderm Ag Mesh [Silver] Other (See Comments)    itching and water blisters itching and water blisters  . Grape Seed Itching and Swelling    Lips and mouth swelling/itching.  Grapes not grape seed  . Latuda [Lurasidone Hcl] Other (See Comments)  . Tapentadol Other (See Comments)    VITALS:  Blood pressure 123/78, pulse 87, temperature 98.1 F (36.7 C), temperature source Oral, resp. rate 17, height 5\' 8"  (1.727 m), weight 110.7 kg, SpO2 94 %.  PHYSICAL EXAMINATION:  Physical Exam   GENERAL:  68 y.o.-year-old obese patient lying in the bed, in NAD. EYES: Pupils equal, round, reactive to light and accommodation. No  scleral icterus. Extraocular muscles intact.  HEENT: Head atraumatic, normocephalic. Oropharynx and nasopharynx clear.  NECK:  Supple, no jugular venous distention. No thyroid enlargement, no tenderness.  LUNGS: Normal breath sounds bilaterally, no wheezing, rales,rhonchi or crepitation. No use of accessory muscles of respiration.  Decreased bibasilar breath sounds CARDIOVASCULAR: RRR, S1, S2 normal. No murmurs, rubs, or gallops.  ABDOMEN: Soft, obese, nontender, nondistended. Bowel sounds present. No organomegaly or mass.  EXTREMITIES: Right leg is shortened and externally rotated.  No pedal edema, cyanosis, or clubbing.  NEUROLOGIC: Cranial nerves II through XII are intact. +decreased strength in right leg secondary to pain. Sensation intact. Gait not checked.  PSYCHIATRIC: The patient is alert and oriented x 3.  SKIN: No obvious rash, lesion, or ulcer.    LABORATORY PANEL:   CBC Recent Labs  Lab 10/03/18 0253  WBC 11.5*  HGB 12.7  HCT 38.1  PLT 275   ------------------------------------------------------------------------------------------------------------------  Chemistries  Recent Labs  Lab 10/05/18 0159 10/05/18 1014  NA 127* 128*  K 3.5  --   CL 90*  --   CO2 28  --   GLUCOSE 153*  --   BUN 8  --   CREATININE 0.50  --   CALCIUM 7.7*  --    ------------------------------------------------------------------------------------------------------------------  Cardiac Enzymes Recent Labs  Lab 10/02/18 2252  TROPONINI <0.03   ------------------------------------------------------------------------------------------------------------------  RADIOLOGY:  No results found.  EKG:   Orders placed or performed during the hospital encounter of 10/02/18  . EKG 12-Lead  . EKG 12-Lead  . ED EKG  . ED EKG  . EKG    ASSESSMENT  AND PLAN:   68 year old female with multiple medical problems including degenerative disc disease, chronic back pain following with pain  management clinic, COPD, CAD, bipolar with depression and anxiety presents to hospital secondary to fall and right hip pain.  1.  Mechanical fall and right femoral neck fracture -Ortho following- plan for surgery tomorrow -Continue IV pain medications -Physical therapy after surgery  2.  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 Na has improved to 127 today. -Nephrology following -Continue tolvaptan for one more day  3. Hypertension- continue Avapro and Coreg  4. Type 2 diabetes- takes metformin at home -Sensitive SSI  5. COPD- stable, no signs of acute exacerbation -Albuterol nebs prn  6. CAD- stable, no active chest pain -Continue aspirin, coreg, ACE  7. History of DVT/PE- previously on eliquis, but is no longer taking this. -Heparin for DVT prophylaxis  8. Depression/anxiety/bipolar disorder- stable -Continue cymbalta, zyprexa, trazodone, klonopin, depakote  All the records are reviewed and case discussed with Care Management/Social Worker. Management plans discussed with the patient, family and they are in agreement.  CODE STATUS: Full Code  TOTAL TIME TAKING CARE OF THIS PATIENT: 35 minutes.   POSSIBLE D/C IN 2-3 DAYS, DEPENDING ON CLINICAL CONDITION.   Jinny BlossomKaty D Liberty Seto M.D on 10/05/2018 at 1:32 PM  Between 7am to 6pm - Pager - [phone]  After 6pm go to [email or web address] - Social research officer, governmentpassword EPAS [facility]  [facility] [place] [facility]  Office  [phone]  CC: Primary care physician; Barbette ReichmannHande, Vishwanath, MD

## 2018-10-06 ENCOUNTER — Encounter
Admission: EM | Disposition: A | Discharge status: Skilled Nursing Facility | Payer: Self-pay | Source: Home / Self Care | Attending: Internal Medicine

## 2018-10-06 ENCOUNTER — Inpatient Hospital Stay: Payer: Medicare Other

## 2018-10-06 ENCOUNTER — Inpatient Hospital Stay: Payer: Medicare Other | Admitting: Anesthesiology

## 2018-10-06 HISTORY — PX: HIP ARTHROPLASTY: SHX981

## 2018-10-06 LAB — CBC
HCT: 38.4 % (ref 36.0–46.0)
Hemoglobin: 12.7 g/dL (ref 12.0–15.0)
MCH: 30.9 pg (ref 26.0–34.0)
MCHC: 33.1 g/dL (ref 30.0–36.0)
MCV: 93.4 fL (ref 80.0–100.0)
Platelets: 279 10*3/uL (ref 150–400)
RBC: 4.11 MIL/uL (ref 3.87–5.11)
RDW: 12.6 % (ref 11.5–15.5)
WBC: 9.9 10*3/uL (ref 4.0–10.5)
nRBC: 0 % (ref 0.0–0.2)

## 2018-10-06 LAB — BASIC METABOLIC PANEL
Anion gap: 9 (ref 5–15)
BUN: 10 mg/dL (ref 8–23)
CO2: 29 mmol/L (ref 22–32)
Calcium: 8.1 mg/dL — ABNORMAL LOW (ref 8.9–10.3)
Chloride: 93 mmol/L — ABNORMAL LOW (ref 98–111)
Creatinine, Ser: 0.53 mg/dL (ref 0.44–1.00)
GFR calc non Af Amer: >60 mL/min (ref >60)
Glucose, Bld: 155 mg/dL — ABNORMAL HIGH (ref 70–99)
Potassium: 3.8 mmol/L (ref 3.5–5.1)
Sodium: 131 mmol/L — ABNORMAL LOW (ref 135–145)

## 2018-10-06 LAB — GLUCOSE, CAPILLARY
Glucose-Capillary: 135 mg/dL — ABNORMAL HIGH (ref 70–99)
Glucose-Capillary: 150 mg/dL — ABNORMAL HIGH (ref 70–99)
Glucose-Capillary: 159 mg/dL — ABNORMAL HIGH (ref 70–99)
Glucose-Capillary: 153 mg/dL — ABNORMAL HIGH (ref 70–99)

## 2018-10-06 SURGERY — HEMIARTHROPLASTY, HIP, DIRECT ANTERIOR APPROACH, FOR FRACTURE
Anesthesia: General | Laterality: Right

## 2018-10-06 MED ORDER — BUPIVACAINE HCL (PF) 0.5 % IJ SOLN
INTRAMUSCULAR | Status: DC | PRN
  Administered 2018-10-06: 3 mL

## 2018-10-06 MED ORDER — KETAMINE HCL 50 MG/ML IJ SOLN
INTRAMUSCULAR | Status: AC
  Filled 2018-10-06: qty 10

## 2018-10-06 MED ORDER — ACETAMINOPHEN 10 MG/ML IV SOLN
INTRAVENOUS | Status: AC
  Filled 2018-10-06: qty 100

## 2018-10-06 MED ORDER — ONDANSETRON HCL 4 MG/2ML IJ SOLN: 4.0000 mg | Freq: Once | INTRAMUSCULAR | Status: DC | PRN

## 2018-10-06 MED ORDER — GENTAMICIN SULFATE 40 MG/ML IJ SOLN
INTRAMUSCULAR | Status: AC
  Filled 2018-10-06: qty 8

## 2018-10-06 MED ORDER — PROMETHAZINE HCL 25 MG/ML IJ SOLN
INTRAMUSCULAR | Status: AC
  Administered 2018-10-06: 12.5 mg via INTRAVENOUS
  Filled 2018-10-06: qty 1

## 2018-10-06 MED ORDER — HYDROMORPHONE HCL 1 MG/ML IJ SOLN
0.5000 mg | INTRAMUSCULAR | Status: DC | PRN
  Administered 2018-10-06 – 2018-10-07 (×3): 0.5 mg via INTRAVENOUS

## 2018-10-06 MED ORDER — PROPOFOL 500 MG/50ML IV EMUL
INTRAVENOUS | Status: AC
  Filled 2018-10-06: qty 50

## 2018-10-06 MED ORDER — MIDAZOLAM HCL 2 MG/2ML IJ SOLN
INTRAMUSCULAR | Status: AC
  Administered 2018-10-06: 1 mg via INTRAVENOUS
  Filled 2018-10-06: qty 2

## 2018-10-06 MED ORDER — CEFAZOLIN SODIUM-DEXTROSE 2-4 GM/100ML-% IV SOLN
2.0000 g | INTRAVENOUS | Status: AC
  Administered 2018-10-06: 2 g via INTRAVENOUS
  Filled 2018-10-06: qty 100

## 2018-10-06 MED ORDER — SODIUM CHLORIDE FLUSH 0.9 % IV SOLN
INTRAVENOUS | Status: AC
  Filled 2018-10-06: qty 10

## 2018-10-06 MED ORDER — SODIUM CHLORIDE 0.9 % IV SOLN
INTRAVENOUS | Status: DC | PRN
  Administered 2018-10-06: 75 ug/min via INTRAVENOUS

## 2018-10-06 MED ORDER — SODIUM CHLORIDE 0.9 % IV SOLN
INTRAVENOUS | Status: DC | PRN
  Administered 2018-10-06 (×2): via INTRAVENOUS

## 2018-10-06 MED ORDER — FENTANYL CITRATE (PF) 100 MCG/2ML IJ SOLN
25.0000 ug | INTRAMUSCULAR | Status: AC | PRN
  Administered 2018-10-06 (×3): 25 ug via INTRAVENOUS
  Administered 2018-10-06: 100 ug via INTRAVENOUS
  Administered 2018-10-06 (×2): 25 ug via INTRAVENOUS

## 2018-10-06 MED ORDER — ONDANSETRON HCL 4 MG/2ML IJ SOLN
INTRAMUSCULAR | Status: DC | PRN
  Administered 2018-10-06: 4 mg via INTRAVENOUS

## 2018-10-06 MED ORDER — PROPOFOL 500 MG/50ML IV EMUL
INTRAVENOUS | Status: DC | PRN
  Administered 2018-10-06: 50 ug/kg/min via INTRAVENOUS

## 2018-10-06 MED ORDER — PROPOFOL 10 MG/ML IV BOLUS
INTRAVENOUS | Status: DC | PRN
  Administered 2018-10-06 (×2): 50 mg via INTRAVENOUS

## 2018-10-06 MED ORDER — PHENYLEPHRINE HCL 10 MG/ML IJ SOLN
INTRAMUSCULAR | Status: DC | PRN
  Administered 2018-10-06: 200 ug via INTRAVENOUS
  Administered 2018-10-06: 100 ug via INTRAVENOUS

## 2018-10-06 MED ORDER — FENTANYL CITRATE (PF) 100 MCG/2ML IJ SOLN
50.0000 ug | Freq: Once | INTRAMUSCULAR | Status: AC
  Administered 2018-10-06: 50 ug via INTRAVENOUS

## 2018-10-06 MED ORDER — HYDROMORPHONE HCL 1 MG/ML IJ SOLN
INTRAMUSCULAR | Status: AC
  Administered 2018-10-06: 0.5 mg via INTRAVENOUS
  Filled 2018-10-06: qty 1

## 2018-10-06 MED ORDER — ACETAMINOPHEN 10 MG/ML IV SOLN
INTRAVENOUS | Status: DC | PRN
  Administered 2018-10-06: 1000 mg via INTRAVENOUS

## 2018-10-06 MED ORDER — FENTANYL CITRATE (PF) 100 MCG/2ML IJ SOLN
INTRAMUSCULAR | Status: AC
  Administered 2018-10-06: 100 ug via INTRAVENOUS
  Filled 2018-10-06: qty 2

## 2018-10-06 MED ORDER — PHENYLEPHRINE HCL 10 MG/ML IJ SOLN
INTRAMUSCULAR | Status: AC
  Filled 2018-10-06: qty 1

## 2018-10-06 MED ORDER — PROMETHAZINE HCL 25 MG/ML IJ SOLN
12.5000 mg | Freq: Once | INTRAMUSCULAR | Status: AC
  Administered 2018-10-06: 12.5 mg via INTRAVENOUS

## 2018-10-06 MED ORDER — FENTANYL CITRATE (PF) 100 MCG/2ML IJ SOLN
INTRAMUSCULAR | Status: AC
  Administered 2018-10-06: 25 ug via INTRAVENOUS
  Filled 2018-10-06: qty 2

## 2018-10-06 MED ORDER — MIDAZOLAM HCL 5 MG/5ML IJ SOLN
INTRAMUSCULAR | Status: DC | PRN
  Administered 2018-10-06: 2 mg via INTRAVENOUS

## 2018-10-06 MED ORDER — SODIUM CHLORIDE 0.9 % IV SOLN
INTRAVENOUS | Status: AC | PRN
  Administered 2018-10-06: 0.333 ug/min via INTRAVENOUS

## 2018-10-06 MED ORDER — EPHEDRINE SULFATE 50 MG/ML IJ SOLN
INTRAMUSCULAR | Status: DC | PRN
  Administered 2018-10-06 (×2): 10 mg via INTRAVENOUS

## 2018-10-06 MED ORDER — MIDAZOLAM HCL 2 MG/2ML IJ SOLN
INTRAMUSCULAR | Status: AC
  Filled 2018-10-06: qty 2

## 2018-10-06 MED ORDER — SODIUM CHLORIDE 0.9 % IV SOLN
INTRAVENOUS | Status: DC | PRN
  Administered 2018-10-06: 8 mL

## 2018-10-06 MED ORDER — VASOPRESSIN 20 UNIT/ML IV SOLN
INTRAVENOUS | Status: DC | PRN
  Administered 2018-10-06: 2 [IU] via INTRAVENOUS
  Administered 2018-10-06 (×5): 1 [IU] via INTRAVENOUS
  Administered 2018-10-06: 2 [IU] via INTRAVENOUS
  Administered 2018-10-06: 1 [IU] via INTRAVENOUS

## 2018-10-06 MED ORDER — MIDAZOLAM HCL 2 MG/2ML IJ SOLN
1.0000 mg | Freq: Once | INTRAMUSCULAR | Status: AC
  Administered 2018-10-06: 1 mg via INTRAVENOUS

## 2018-10-06 MED ORDER — KETAMINE HCL 50 MG/ML IJ SOLN
INTRAMUSCULAR | Status: DC | PRN
  Administered 2018-10-06: 25 mg via INTRAMUSCULAR
  Administered 2018-10-06 (×2): 50 mg via INTRAMUSCULAR

## 2018-10-06 SURGICAL SUPPLY — 62 items
BAG DECANTER FOR FLEXI CONT (MISCELLANEOUS) ×2 IMPLANT
BLADE SAW 90X25X1.19 OSCILLAT (BLADE) ×2 IMPLANT
CANISTER SUCT 1200ML W/VALVE (MISCELLANEOUS) ×2 IMPLANT
CANISTER SUCT 3000ML PPV (MISCELLANEOUS) ×2 IMPLANT
CEMENT HV SMART SET (Cement) ×4 IMPLANT
CEMENT RESTRICTOR DEPUY SZ 4 (Cement) ×2 IMPLANT
CENTRALIZER STEM HIP 12MM (Hips) ×2 IMPLANT
COVER WAND RF STERILE (DRAPES) ×2 IMPLANT
DRAPE INCISE IOBAN 66X60 STRL (DRAPES) ×2 IMPLANT
DRAPE SHEET LG 3/4 BI-LAMINATE (DRAPES) ×2 IMPLANT
DRAPE TABLE BACK 80X90 (DRAPES) ×2 IMPLANT
DRESSING ALLEVYN LIFE SACRUM (GAUZE/BANDAGES/DRESSINGS) ×2 IMPLANT
DRSG DERMACEA 8X12 NADH (GAUZE/BANDAGES/DRESSINGS) ×2 IMPLANT
DRSG OPSITE POSTOP 4X12 (GAUZE/BANDAGES/DRESSINGS) IMPLANT
DRSG OPSITE POSTOP 4X14 (GAUZE/BANDAGES/DRESSINGS) ×2 IMPLANT
DRSG TEGADERM 4X4.75 (GAUZE/BANDAGES/DRESSINGS) ×2 IMPLANT
DURAPREP 26ML APPLICATOR (WOUND CARE) ×4 IMPLANT
ELECT BLADE 6.5 EXT (BLADE) ×2 IMPLANT
ELECT CAUTERY BLADE 6.4 (BLADE) ×2 IMPLANT
ELECT REM PT RETURN 9FT ADLT (ELECTROSURGICAL) ×2
ELECTRODE REM PT RTRN 9FT ADLT (ELECTROSURGICAL) ×1 IMPLANT
GAUZE PACK 2X3YD (GAUZE/BANDAGES/DRESSINGS) ×2 IMPLANT
GLOVE BIOGEL M STRL SZ7.5 (GLOVE) ×8 IMPLANT
GLOVE INDICATOR 8.0 STRL GRN (GLOVE) ×6 IMPLANT
GOWN STRL REUS W/ TWL LRG LVL3 (GOWN DISPOSABLE) ×3 IMPLANT
GOWN STRL REUS W/TWL LRG LVL3 (GOWN DISPOSABLE) ×3
HEAD FEM UNIPOLAR 51 OD STRL (Hips) ×2 IMPLANT
HEMOVAC 400CC 10FR (MISCELLANEOUS) ×2 IMPLANT
HOLDER FOLEY CATH W/STRAP (MISCELLANEOUS) ×2 IMPLANT
HOOD PEEL AWAY FLYTE STAYCOOL (MISCELLANEOUS) ×2 IMPLANT
IV NS 100ML SINGLE PACK (IV SOLUTION) ×2 IMPLANT
KIT TURNOVER KIT A (KITS) ×2 IMPLANT
NDL SAFETY ECLIPSE 18X1.5 (NEEDLE) ×1 IMPLANT
NEEDLE FILTER BLUNT 18X 1/2SAF (NEEDLE) ×1
NEEDLE FILTER BLUNT 18X1 1/2 (NEEDLE) ×1 IMPLANT
NEEDLE HYPO 18GX1.5 SHARP (NEEDLE) ×1
NS IRRIG 1000ML POUR BTL (IV SOLUTION) ×2 IMPLANT
PACK HIP PROSTHESIS (MISCELLANEOUS) ×2 IMPLANT
PENCIL SMOKE ULTRAEVAC 22 CON (MISCELLANEOUS) ×2 IMPLANT
PRESSURIZER CEMENT PROX FEM SM (MISCELLANEOUS) ×2 IMPLANT
PRESSURIZER FEM CANAL M (MISCELLANEOUS) ×2 IMPLANT
PULSAVAC PLUS IRRIG FAN TIP (DISPOSABLE) ×2
SOL .9 NS 3000ML IRR  AL (IV SOLUTION) ×1
SOL .9 NS 3000ML IRR UROMATIC (IV SOLUTION) ×1 IMPLANT
SOL PREP PVP 2OZ (MISCELLANEOUS) ×2
SOLUTION PREP PVP 2OZ (MISCELLANEOUS) ×1 IMPLANT
SPACER FEM TAPERED +0 12/14 (Hips) ×2 IMPLANT
SPONGE DRAIN TRACH 4X4 STRL 2S (GAUZE/BANDAGES/DRESSINGS) ×2 IMPLANT
STAPLER SKIN PROX 35W (STAPLE) ×2 IMPLANT
STEM SUMMIT CEMENTED BASIC SZ6 (Joint) ×1 IMPLANT
SUMMIT CEMENTED BASIC SZ 6 (Joint) ×2 IMPLANT
SUT ETHIBOND #5 BRAIDED 30INL (SUTURE) ×2 IMPLANT
SUT VIC AB 0 CT1 36 (SUTURE) ×2 IMPLANT
SUT VIC AB 1 CT1 36 (SUTURE) ×4 IMPLANT
SUT VIC AB 2-0 CT1 27 (SUTURE) ×1
SUT VIC AB 2-0 CT1 TAPERPNT 27 (SUTURE) ×1 IMPLANT
SYR 20CC LL (SYRINGE) ×2 IMPLANT
SYR TB 1ML 27GX1/2 LL (SYRINGE) ×2 IMPLANT
TAPE TRANSPORE STRL 2 31045 (GAUZE/BANDAGES/DRESSINGS) ×2 IMPLANT
TIP BRUSH PULSAVAC PLUS 24.33 (MISCELLANEOUS) ×2 IMPLANT
TIP FAN IRRIG PULSAVAC PLUS (DISPOSABLE) ×1 IMPLANT
TOWER CARTRIDGE SMART MIX (DISPOSABLE) ×4 IMPLANT

## 2018-10-06 NOTE — Progress Notes (Signed)
UA complete. MD Anne Hahn) aware of results. States there's no need for Antibiotics at this time. Results not suggestive of UTI. MD suspects pelvic pain is related to fracture. No new orders. Attempted to turn pt over to assess back and skin and to see if she was soiled. Pt refused. Pt made aware of risks associated with immobility and not turning in bed frequently. Will continue to monitor and treat pain.

## 2018-10-06 NOTE — Care Management Important Message (Signed)
Important Message  Patient Details  Name: Amy Sweeney MRN: [id number] Date of Birth: [birth year]   Medicare Important Message Given:  Yes    Olegario Messier A Natayla Cadenhead 10/06/2018, 10:59 AM

## 2018-10-06 NOTE — Anesthesia Preprocedure Evaluation (Signed)
Anesthesia Evaluation  Patient identified by MRN, date of birth, ID band Patient awake    Reviewed: Allergy & Precautions, H&P , NPO status , Patient's Chart, lab work & pertinent test results, reviewed documented beta blocker date and time   Airway Mallampati: II   Neck ROM: full    Dental  (+) Poor Dentition   Pulmonary neg pulmonary ROS, shortness of breath, COPD,    Pulmonary exam normal        Cardiovascular Exercise Tolerance: Poor hypertension, On Medications + CAD  negative cardio ROS Normal cardiovascular exam Rhythm:regular Rate:Normal     Neuro/Psych  Headaches, PSYCHIATRIC DISORDERS Anxiety Depression Bipolar Disorder  Neuromuscular disease negative neurological ROS  negative psych ROS   GI/Hepatic negative GI ROS, Neg liver ROS,   Endo/Other  negative endocrine ROSdiabetes  Renal/GU negative Renal ROS  negative genitourinary   Musculoskeletal   Abdominal   Peds  Hematology negative hematology ROS (+)   Anesthesia Other Findings Past Medical History: No date: Anxiety No date: Arthritis     Comment:  degenerative artgritis  osteoarthritis No date: Bipolar 1 disorder (HCC) No date: Bipolar disorder (HCC) No date: COPD (chronic obstructive pulmonary disease) (HCC)     Comment:  hx pulmonary embolism No date: Coronary artery disease No date: DDD (degenerative disc disease), cervical No date: DDD (degenerative disc disease), cervical No date: Depression No date: Diabetes mellitus without complication (HCC)     Comment:  Pt takes metformin.  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, subsequent encounter No date: Night terrors No date: OCD (obsessive compulsive disorder) No date: Osteoarthritis No date: Osteoporosis No date: PTSD (post-traumatic stress disorder) No date: Vitamin B 12 deficiency Past Surgical History: 2004: CARDIAC CATHETERIZATION No date: CERVICAL LAMINOPLASTY No date: CESAREAN SECTION     Comment:  x2 No date: CHOLECYSTECTOMY 04/01/2018: COLONOSCOPY WITH PROPOFOL; N/A     Comment:  Procedure: COLONOSCOPY WITH PROPOFOL;  Surgeon:               Christena Deem, MD;  Location: [facility] ENDOSCOPY;                Service: Endoscopy;  Laterality: N/A; No date: DILATION AND CURETTAGE OF UTERUS 04/01/2018: ESOPHAGOGASTRODUODENOSCOPY (EGD) WITH PROPOFOL; N/A     Comment:  Procedure: ESOPHAGOGASTRODUODENOSCOPY (EGD) WITH               PROPOFOL;  Surgeon: Christena Deem, MD;  Location:               [facility] ENDOSCOPY;  Service: Endoscopy;  Laterality: N/A; No date: FRACTURE SURGERY 2004: GASTRIC BYPASS 10/04/2018: HIP ARTHROPLASTY; Right     Comment:  Procedure: ARTHROPLASTY BIPOLAR HIP (HEMIARTHROPLASTY);               Surgeon: Donato Heinz, MD;  Location: [facility] ORS;                Service: Orthopedics;  Laterality: Right; No date: IVC FILTER PLACEMENT ([facility] HX) 07/30/2016: JOINT REPLACEMENT; Left     Comment:  hip 2015: JOINT REPLACEMENT;  Right     Comment:  knee 2004 & 2006: KNEE ARTHROSCOPY; Left 2014: LAMINECTOMY     Comment:  lumbar 3-6 sacral 1-3  fusion No date: NECK SURGERY     Comment:   times 2 05/21/2015: PERIPHERAL VASCULAR CATHETERIZATION; N/A     Comment:  Procedure: IVC Filter Insertion;  Surgeon: Renford DillsGregory G               Schnier, MD;  Location: [facility] INV[facility]IVE CV LAB;  Service:               Cardiovascular;  Laterality: N/A; 07/23/2015: PERIPHERAL VASCULAR CATHETERIZATION; N/A     Comment:  Procedure: IVC Filter Removal;   Surgeon: Renford DillsGregory G               Schnier, MD;  Location: [facility];  Service:               Cardiovascular;  Laterality: N/A; 07/21/2016: PERIPHERAL VASCULAR CATHETERIZATION; N/A     Comment:  Procedure: IVC Filter Insertion;  Surgeon: Renford DillsGregory G               Schnier, MD;  Location: [facility];  Service:               Cardiovascular;  Laterality: N/A; 10/16/2016: PERIPHERAL VASCULAR CATHETERIZATION; N/A     Comment:  Procedure: IVC Filter Removal;  Surgeon: Renford DillsGregory G               Schnier, MD;  Location: [facility];  Service:               Cardiovascular;  Laterality: N/A; 07/26/2017  09/01/2017: TOOTH EXTRACTION 07/30/2016: TOTAL HIP ARTHROPLASTY; Left     Comment:  Procedure: TOTAL HIP ARTHROPLASTY ANTERIOR APPROACH;                Surgeon: Kennedy BuckerMichael Menz, MD;  Location: [facility] ORS;  Service:              Orthopedics;  Laterality: Left; 05/30/2015: TOTAL KNEE ARTHROPLASTY; Right     Comment:  Procedure: TOTAL KNEE ARTHROPLASTY;  Surgeon: Kennedy BuckerMichael               Menz, MD;  Location: [facility] ORS;  Service: Orthopedics;                Laterality: Right; BMI    Body Mass Index:  37.10 kg/m     Reproductive/Obstetrics negative OB ROS                             Anesthesia Physical Anesthesia Plan  ASA: III and emergent  Anesthesia Plan: General and Spinal   Post-op Pain Management:    Induction:   PONV Risk Score and Plan:   Airway Management Planned:   Additional Equipment:   Intra-op Plan:   Post-operative Plan:   Informed Consent: I have reviewed the patients History and Physical, chart, labs and discussed the procedure including the risks, benefits and alternatives for the proposed anesthesia with the patient or authorized representative who has indicated his/her understanding and acceptance.     Dental Advisory Given  Plan Discussed with: CRNA  Anesthesia Plan Comments:         Anesthesia Quick Evaluation

## 2018-10-06 NOTE — Progress Notes (Signed)
[facility] - [place] at [facility]   PATIENT NAME: Amy Sweeney    MR#:  [id number]  DATE OF BIRTH:  06/05/1951  SUBJECTIVE:   Patient states she is feeling better this morning.  She was having some pelvic pain yesterday afternoon/evening.  This has resolved.  She notes some discomfort around her Foley site.  Right hip pain is overall well controlled with pain medicines.  REVIEW OF SYSTEMS:  Review of Systems  Constitutional: Negative for chills, fever and malaise/fatigue.  HENT: Negative for congestion, ear discharge, hearing loss and nosebleeds.   Eyes: Negative for blurred vision and double vision.  Respiratory: Negative for cough, shortness of breath and wheezing.   Cardiovascular: Negative for chest pain and palpitations.  Gastrointestinal: Negative for abdominal pain, constipation, diarrhea, nausea and vomiting.  Genitourinary: Negative for dysuria.  Musculoskeletal: Positive for back pain, falls, joint pain and myalgias.  Neurological: Negative for dizziness, focal weakness, seizures, weakness and headaches.  Psychiatric/Behavioral: Negative for depression.    DRUG ALLERGIES:   Allergies  Allergen Reactions  . Bee Venom Anaphylaxis  . Morphine And Related Anaphylaxis    Blisters itching, N&V, face and throat swelling  . Adhesive [Tape] Other (See Comments)    Itching and water blisters. Paper tape is OK.  . Celebrex [Celecoxib] Swelling  . Tegaderm Ag Mesh [Silver] Other (See Comments)    itching and water blisters itching and water blisters  . Grape Seed Itching and Swelling    Lips and mouth swelling/itching.  Grapes not grape seed  . Latuda [Lurasidone Hcl] Other (See Comments)  . Tapentadol Other (See Comments)    VITALS:  Blood pressure (!) 152/93, pulse 92, temperature 98.7 F (37.1 C), temperature source Oral, resp. rate 18, height 5\' 8"  (1.727 m), weight 110.7 kg, SpO2 96 %.  PHYSICAL EXAMINATION:  Physical Exam   GENERAL:   68 y.o.-year-old obese patient lying in the bed, in NAD. EYES: Pupils equal, round, reactive to light and accommodation. No scleral icterus. Extraocular muscles intact.  HEENT: Head atraumatic, normocephalic. Oropharynx and nasopharynx clear.  NECK:  Supple, no jugular venous distention. No thyroid enlargement, no tenderness.  LUNGS: Normal breath sounds bilaterally, no wheezing, rales,rhonchi or crepitation. No use of accessory muscles of respiration.  Decreased bibasilar breath sounds CARDIOVASCULAR: RRR, S1, S2 normal. No murmurs, rubs, or gallops.  ABDOMEN: Soft, obese, nondistended. + Mild generalized tenderness to palpation along the entire right side of the abdomen. Bowel sounds present. No organomegaly or mass.  EXTREMITIES: Right leg is shortened and externally rotated.  No pedal edema, cyanosis, or clubbing.  NEUROLOGIC: Cranial nerves II through XII are intact. +decreased strength in right leg secondary to pain. Sensation intact. Gait not checked.  PSYCHIATRIC: The patient is alert and oriented x 3.  SKIN: No obvious rash, lesion, or ulcer.    LABORATORY PANEL:   CBC Recent Labs  Lab 10/06/18 0342  WBC 9.9  HGB 12.7  HCT 38.4  PLT 279   ------------------------------------------------------------------------------------------------------------------  Chemistries  Recent Labs  Lab 10/06/18 0342  NA 131*  K 3.8  CL 93*  CO2 29  GLUCOSE 155*  BUN 10  CREATININE 0.53  CALCIUM 8.1*   ------------------------------------------------------------------------------------------------------------------  Cardiac Enzymes Recent Labs  Lab 10/02/18 2252  TROPONINI <0.03   ------------------------------------------------------------------------------------------------------------------  RADIOLOGY:  No results found.  EKG:   Orders placed or performed during the hospital encounter of 10/02/18  . EKG 12-Lead  . EKG 12-Lead  . ED EKG  . ED  EKG  . EKG     ASSESSMENT AND PLAN:   1.  Mechanical fall and right femoral neck fracture -Ortho following- plan for surgery today -Continue IV pain medications -Physical therapy after surgery  2.  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 Na has improved to 131 -Nephrology following -Stop tolvaptan  3. Hypertension- continue Avapro and Coreg  4. Type 2 diabetes- takes metformin at home -Sensitive SSI  5. COPD- stable, no signs of acute exacerbation -Albuterol nebs prn  6. CAD- stable, no active chest pain -Continue aspirin, coreg, ACE  7. History of DVT/PE- previously on eliquis, but is no longer taking this. -Heparin for DVT prophylaxis  8. Depression/anxiety/bipolar disorder- stable -Continue cymbalta, zyprexa, trazodone, klonopin, depakote  All the records are reviewed and case discussed with Care Management/Social Worker. Management plans discussed with the patient, family and they are in agreement.  CODE STATUS: Full Code  TOTAL TIME TAKING CARE OF THIS PATIENT: 35 minutes.   POSSIBLE D/C IN 2-3 DAYS, DEPENDING ON CLINICAL CONDITION.   Jinny Blossom Kearston Putman M.D on 10/06/2018 at 11:04 AM  Between 7am to 6pm - Pager - [phone]  After 6pm go to [email or web address] - Social research officer, government  [facility] [place] [facility]  Office  [phone]  CC: Primary care physician; Barbette Reichmann, MD

## 2018-10-06 NOTE — Progress Notes (Signed)
Pts bed saturated with urine. Pt refusing to get cleaned up at this time. Pt stating she " wants to wait until she can have PRN oxycodone" and "does not want to move". Pt educated and notified that laying in urine can cause skin breakdown. Pt states " she doesn't care".

## 2018-10-06 NOTE — Progress Notes (Signed)
[facility]  ROUNDING NOTE   Subjective:   Na 131 tolvaptan x 2 days.   Husband at bedside.   Objective:  Vital signs in last 24 hours:  Temp:  [98.1 F (36.7 C)-98.7 F (37.1 C)] 98.7 F (37.1 C) (01/22 2336) Pulse Rate:  [82-96] 92 (01/23 0822) Resp:  [18-19] 18 (01/23 0822) BP: (111-152)/(69-93) 152/93 (01/23 0822) SpO2:  [92 %-96 %] 96 % (01/23 0822)  Weight change:  Filed Weights   10/02/18 2247  Weight: 110.7 kg    Intake/Output: I/O last 3 completed shifts: In: -  Out: 5225 [Urine:5225]   Intake/Output this shift:  Total I/O In: -  Out: 950 [Urine:950]  Physical Exam: General: NAD,   Head: Normocephalic, atraumatic. Moist oral mucosal membranes  Eyes: Anicteric, PERRL  Neck: Supple, trachea midline  Lungs:  Clear to auscultation  Heart: Regular rate and rhythm  Abdomen:  Soft, nontender,   Extremities: No peripheral edema.  Neurologic: Nonfocal, moving all four extremities  Skin: No lesions        Basic Metabolic Panel: Recent Labs  Lab 10/03/18 0945  10/04/18 0445  10/04/18 1308 10/04/18 1407 10/04/18 1753 10/05/18 0159 10/05/18 1014 10/06/18 0342  NA 122*   < > 121*   < > 122* 122* 123* 127* 128* 131*  K 3.7  --  3.3*  --  3.6  --   --  3.5  --  3.8  CL 86*  --  85*  --  85*  --   --  90*  --  93*  CO2 27  --  26  --  27  --   --  28  --  29  GLUCOSE 164*  --  174*  --  164*  --   --  153*  --  155*  BUN 9  --  7*  --  6*  --   --  8  --  10  CREATININE 0.55  --  0.40*  --  0.34*  --   --  0.50  --  0.53  CALCIUM 8.0*  --  7.8*  --  7.7*  --   --  7.7*  --  8.1*   < > = values in this interval not displayed.    Liver Function Tests: No results for input(s): AST, ALT, ALKPHOS, BILITOT, PROT, ALBUMIN in the last 168 hours. No results for input(s): LIPASE, AMYLASE in the last 168 hours. No results for input(s): AMMONIA in the last 168 hours.  CBC: Recent Labs  Lab 10/02/18 2252 10/03/18 0253 10/06/18 0342  WBC 7.5  11.5* 9.9  NEUTROABS 4.4  --   --   HGB 12.8 12.7 12.7  HCT 37.7 38.1 38.4  MCV 90.6 92.9 93.4  PLT 276 275 279    Cardiac Enzymes: Recent Labs  Lab 10/02/18 2252  TROPONINI <0.03    BNP: Invalid input(s): POCBNP  CBG: Recent Labs  Lab 10/05/18 0736 10/05/18 1138 10/05/18 1709 10/05/18 2116 10/06/18 0819  GLUCAP 160* 148* 122* 158* 135*    Microbiology: Results for orders placed or performed during the hospital encounter of 10/02/18  Surgical pcr screen     Status: None   Collection Time: 10/03/18  1:48 AM  Result Value Ref Range Status   MRSA, PCR NEGATIVE NEGATIVE Final   Staphylococcus aureus NEGATIVE NEGATIVE Final    Comment: (NOTE) The Xpert SA Assay (FDA approved for NASAL specimens in patients 26 years of age and older), is  one component of a comprehensive surveillance program. It is not intended to diagnose infection nor to guide or monitor treatment. Performed at [facility], [address] [place], [place] [address]     Coagulation Studies: No results for input(s): LABPROT, INR in the last 72 hours.  Urinalysis: Recent Labs    10/05/18 1949  COLORURINE STRAW*  LABSPEC 1.003*  PHURINE 6.0  GLUCOSEU NEGATIVE  HGBUR MODERATE*  BILIRUBINUR NEGATIVE  KETONESUR 5*  PROTEINUR NEGATIVE  NITRITE NEGATIVE  LEUKOCYTESUR TRACE*      Imaging: No results found.   Medications:   .  ceFAZolin (ANCEF) IV     . amLODipine  5 mg Oral Daily  . baclofen  5 mg Oral BID  . carvedilol  12.5 mg Oral BID WC  . clonazePAM  0.5 mg Oral BID  . diphenhydrAMINE  50-100 mg Oral QHS  . divalproex  1,500 mg Oral Daily  . DULoxetine  120 mg Oral Daily  . ferrous sulfate  650 mg Oral Q1400  . fluticasone  1 spray Each Nare BID  . gabapentin  1,200 mg Oral TID  . insulin aspart  0-5 Units Subcutaneous QHS  . insulin aspart  0-9 Units Subcutaneous TID WC  . irbesartan  300 mg Oral Daily  . latanoprost  1 drop Both Eyes QHS  . mouth rinse  15 mL  Mouth Rinse BID  . nystatin   Topical BID  . OLANZapine  2.5 mg Oral QHS  . oxyCODONE  20 mg Oral Q12H  . traZODone  100 mg Oral QHS  . vitamin B-12  5,000 mcg Oral Q1400   acetaminophen **OR** acetaminophen, albuterol, hydrALAZINE, HYDROmorphone (DILAUDID) injection, Melatonin, ondansetron **OR** ondansetron (ZOFRAN) IV, oxyCODONE, polyethylene glycol  Assessment/ Plan:  Ms. DAIZEE KOVACK is a 68 y.o. white female with hypertension, diabetes mellitus type II, bipolar disorder, coronary artery disease, COPD, gastric bypass, chronic pain on chronic narcotics , who was admitted to [facility] on 10/02/2018 for right hip fracture  1. Hyponatremia: acute on chronic hyponatremia. Patient has history of polydipsia. Labs are most consistent with this.  - tolvaptan 15mg  PO x 2 days  2. Hypertension:  Carvedilol, amlodipine and irbesartan.  3. 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 On metformin as outpatient.  Hyperglycemia can cause dry mouth and polydipsia.  Hemoglobin A1c of 5.9%.    LOS: 3 Caroline Matters 1/23/202011:16 AM

## 2018-10-06 NOTE — Anesthesia Procedure Notes (Addendum)
Spinal  Patient location during procedure: OR Staffing Anesthesiologist: Molli Barrows, MD Performed: anesthesiologist  Preanesthetic Checklist Completed: patient identified, site marked, surgical consent, pre-op evaluation, timeout performed, IV checked, risks and benefits discussed and monitors and equipment checked Spinal Block Patient position: left lateral decubitus Prep: Betadine Patient monitoring: heart rate, continuous pulse ox, blood pressure and cardiac monitor Approach: midline Location: L4-5 Injection technique: single-shot Needle Needle type: Whitacre and Introducer  Needle gauge: 24 G Needle length: 10 cm Additional Notes Negative paresthesia. Negative blood return. Positive free-flowing CSF. Expiration date of kit checked and confirmed. Patient tolerated procedure well, without complications.

## 2018-10-06 NOTE — Op Note (Signed)
OPERATIVE NOTE  DATE OF SURGERY:  10/06/2018  PATIENT NAME:  Amy Sweeney   DOB: 1950/10/24  MRN: [id number]  PRE-OPERATIVE DIAGNOSIS: Right femoral neck fracture  POST-OPERATIVE DIAGNOSIS:  Same  PROCEDURE:  Right hip hemiarthroplasty  SURGEON:  Jena Gauss. M.D.  ANESTHESIA: spinal  ESTIMATED BLOOD LOSS: 200 mL  FLUIDS REPLACED: 1800 mL of crystalloid  DRAINS: 2 medium drains to a Hemovac reservoir  IMPLANTS UTILIZED: DePuy size 6 Summit femoral stem (cemented), 12 mm Cementralizer, 51 mm OD Cathcart hip ball, +0 mm tapered spacer, and a size 4 femoral cement restrictor  INDICATIONS FOR SURGERY: Amy Sweeney is a 68 y.o. year old female who fell and sustained a displaced right femoral neck fracture. After discussion of the risks and benefits of surgical intervention, the patient expressed understanding of the risks benefits and agree with plans for hip hemiarthroplasty.   The risks, benefits, and alternatives were discussed at length including but not limited to the risks of infection, bleeding, nerve injury, stiffness, blood clots, the need for revision surgery, limb length inequality, dislocation, cardiopulmonary complications, among others, and they were willing to proceed.  PROCEDURE IN DETAIL: The patient was brought into the operating room and, after adequate spinal anesthesia was achieved, patient was placed in a left lateral decubitus position. Axillary roll was placed and all bony prominences were well-padded. The patient's right hip was cleaned and prepped with alcohol and DuraPrep and draped in the usual sterile fashion. A "timeout" was performed as per usual protocol. A lateral curvilinear incision was made gently curving towards the posterior superior iliac spine. The IT band was incised in line with the skin incision and the fibers of the gluteus maximus were split in line. The piriformis tendon was identified, skeletonized, and incised at its insertion  to the proximal femur and reflected posteriorly. A T type posterior capsulotomy was performed. The femoral head was then removed using a corkscrew device. The femoral head was measured using calipers and ring gauges and determined to be 51 mm in diameter.The femoral neck cut was performed using an oscillating saw. The acetabulum was inspected for any bony fragments. The articular surface was in good condition.  Attention was then directed to the proximal femur. A pilot hole for preparation of the proximal femoral canal was created using a high-speed bur. The femoral canal finder was inserted followed by insertion of the conical reamer. Serial broaches were inserted up to a size 6 broach. Calcar region was planed and a trial reduction was performed using a 51 mm OD Cathcart ball with a +0 mm neck length. Good equalization of limb lengths was appreciated and excellent stability was noted both anteriorly and posteriorly. Trial components were removed. The femoral canal was sized and was felt that a size 4 cement restrictor was appropriate. The cement restrictor was inserted to the appropriate depth in the femoral canal was irrigated with copious amounts of fluid using the pulse lavage and suctioned dry. The femoral canal was then packed with vaginal packing soaked in dilute Neo-Synephrine. Polymethylmethacrylate cement was prepared in the usual fashion using a vacuum mixer. Vaginal packing was removed and the canal again irrigated and suctioned dry. The polymethylmethacrylate cement was inserted in retrograde fashion and pressurized. The size 6 Summit femoral component with a 12 mm Cementralizer was positioned and impacted into place. Excess cement was removed using Personal assistant. After adequate curing of the cement, the Morse taper was cleaned and dried. A 51 mm outer diameter Cathcart  hip ball with a +0 mm tapered spacer was placed on the trunnion and impacted into place. The acetabulum was again irrigated and  suctioned dry, making sure to inspect for any residal bony debris. The femoral head was then reduced and placed through a range of motion. Excellent stability was noted both anteriorly and posteriorly. Good equalization of limb lengths was appreciated.   The wound was irrigated with copious amounts of normal saline with antibiotic solution and suctioned dry. Good hemostasis was appreciated. The posterior capsulotomy was repaired using #5 Ethibond. Piriformis tendon was reapproximated to the undersurface of the gluteus medius tendon using #5 Ethibond. Two medium drains were placed in the wound bed and brought out through separate stab incisions to be attached to a Hemovac reservoir. The IT band was reapproximated using interrupted sutures of #1 Vicryl. Subcutaneous tissue was proximal phalanx using first #0 Vicryl followed by #2-0 Vicryl. The skin was closed with skin staples.  The patient tolerated the procedure well and was transported to the recovery room in stable condition.   Marciano Sequin., M.D.

## 2018-10-06 NOTE — Progress Notes (Signed)
Pt refusing TED hose. Pt educated.

## 2018-10-06 NOTE — Anesthesia Post-op Follow-up Note (Signed)
Anesthesia QCDR form completed.        

## 2018-10-06 NOTE — Transfer of Care (Signed)
Immediate Anesthesia Transfer of Care Note  Patient: Amy Sweeney  Procedure(s) Performed: ARTHROPLASTY BIPOLAR HIP (HEMIARTHROPLASTY) (Right )  Patient Location: PACU  Anesthesia Type:Spinal  Level of Consciousness: awake and alert   Airway & Oxygen Therapy: Patient connected to face mask oxygen  Post-op Assessment: Post -op Vital signs reviewed and stable  Post vital signs: stable  Last Vitals:  Vitals Value Taken Time  BP 124/84 10/06/2018 10:47 PM  Temp 36.6 C 10/06/2018 10:47 PM  Pulse 99 10/06/2018 10:47 PM  Resp 16 10/06/2018 10:47 PM  SpO2 100 % 10/06/2018 10:47 PM    Last Pain:  Vitals:   10/06/18 2247  TempSrc: Temporal  PainSc:       Patients Stated Pain Goal: 2 (10/06/18 1729)  Complications: No apparent anesthesia complications

## 2018-10-07 ENCOUNTER — Encounter: Payer: Self-pay | Admitting: Orthopedic Surgery

## 2018-10-07 LAB — CBC
HEMATOCRIT: 37.7 % (ref 36.0–46.0)
Hemoglobin: 12.1 g/dL (ref 12.0–15.0)
MCH: 30.9 pg (ref 26.0–34.0)
MCHC: 32.1 g/dL (ref 30.0–36.0)
MCV: 96.4 fL (ref 80.0–100.0)
PLATELETS: 270 10*3/uL (ref 150–400)
RBC: 3.91 MIL/uL (ref 3.87–5.11)
RDW: 13.1 % (ref 11.5–15.5)
WBC: 10.9 10*3/uL — ABNORMAL HIGH (ref 4.0–10.5)
nRBC: 0 % (ref 0.0–0.2)

## 2018-10-07 LAB — GLUCOSE, CAPILLARY
Glucose-Capillary: 167 mg/dL — ABNORMAL HIGH (ref 70–99)
Glucose-Capillary: 165 mg/dL — ABNORMAL HIGH (ref 70–99)
Glucose-Capillary: 165 mg/dL — ABNORMAL HIGH (ref 70–99)
Glucose-Capillary: 173 mg/dL — ABNORMAL HIGH (ref 70–99)

## 2018-10-07 LAB — BASIC METABOLIC PANEL
Anion gap: 7 (ref 5–15)
BUN: 15 mg/dL (ref 8–23)
CO2: 29 mmol/L (ref 22–32)
Calcium: 7.2 mg/dL — ABNORMAL LOW (ref 8.9–10.3)
Chloride: 97 mmol/L — ABNORMAL LOW (ref 98–111)
Creatinine, Ser: 0.5 mg/dL (ref 0.44–1.00)
GFR calc Af Amer: >60 mL/min (ref >60)
GFR calc non Af Amer: >60 mL/min (ref >60)
GLUCOSE: 172 mg/dL — AB (ref 70–99)
Potassium: 4.1 mmol/L (ref 3.5–5.1)
Sodium: 133 mmol/L — ABNORMAL LOW (ref 135–145)

## 2018-10-07 MED ORDER — METOCLOPRAMIDE HCL 5 MG/ML IJ SOLN: 5.0000 mg | Freq: Three times a day (TID) | INTRAMUSCULAR | Status: DC | PRN

## 2018-10-07 MED ORDER — SODIUM CHLORIDE 0.9 % IV SOLN
INTRAVENOUS | Status: DC
  Administered 2018-10-07 – 2018-10-08 (×3): via INTRAVENOUS

## 2018-10-07 MED ORDER — OXYCODONE HCL 5 MG PO TABS
10.0000 mg | ORAL_TABLET | ORAL | Status: DC | PRN
  Administered 2018-10-07: 10 mg via ORAL
  Filled 2018-10-07: qty 2

## 2018-10-07 MED ORDER — ACETAMINOPHEN 325 MG PO TABS: 325.0000 mg | ORAL_TABLET | Freq: Four times a day (QID) | ORAL | Status: DC | PRN

## 2018-10-07 MED ORDER — LORAZEPAM 2 MG/ML IJ SOLN
INTRAMUSCULAR | Status: AC
  Administered 2018-10-07: 1 mg via INTRAVENOUS
  Filled 2018-10-07: qty 1

## 2018-10-07 MED ORDER — ONDANSETRON HCL 4 MG/2ML IJ SOLN: 4.0000 mg | Freq: Four times a day (QID) | INTRAMUSCULAR | Status: DC | PRN

## 2018-10-07 MED ORDER — ONDANSETRON HCL 4 MG PO TABS: 4.0000 mg | ORAL_TABLET | Freq: Four times a day (QID) | ORAL | Status: DC | PRN

## 2018-10-07 MED ORDER — ATORVASTATIN CALCIUM 20 MG PO TABS
20.0000 mg | ORAL_TABLET | Freq: Every day | ORAL | Status: DC
  Administered 2018-10-07: 20 mg via ORAL
  Filled 2018-10-07: qty 1

## 2018-10-07 MED ORDER — DIPHENHYDRAMINE HCL 50 MG/ML IJ SOLN
12.5000 mg | Freq: Once | INTRAMUSCULAR | Status: AC
  Administered 2018-10-07: 12.5 mg via INTRAVENOUS
  Filled 2018-10-07: qty 1

## 2018-10-07 MED ORDER — OXYCODONE HCL 5 MG PO TABS: 5.0000 mg | ORAL_TABLET | ORAL | Status: DC | PRN

## 2018-10-07 MED ORDER — OXYCODONE HCL ER 10 MG PO T12A
20.0000 mg | EXTENDED_RELEASE_TABLET | Freq: Two times a day (BID) | ORAL | Status: DC
  Administered 2018-10-07 – 2018-10-08 (×2): 20 mg via ORAL
  Filled 2018-10-07 (×3): qty 2

## 2018-10-07 MED ORDER — ENOXAPARIN SODIUM 40 MG/0.4ML ~~LOC~~ SOLN
40.0000 mg | SUBCUTANEOUS | Status: DC
  Administered 2018-10-07 – 2018-10-08 (×2): 40 mg via SUBCUTANEOUS
  Filled 2018-10-07 (×2): qty 0.4

## 2018-10-07 MED ORDER — POLYETHYLENE GLYCOL 3350 17 G PO PACK
17.0000 g | PACK | Freq: Every day | ORAL | Status: DC
  Administered 2018-10-07: 17 g via ORAL
  Filled 2018-10-07: qty 1

## 2018-10-07 MED ORDER — MENTHOL 3 MG MT LOZG
1.0000 | LOZENGE | OROMUCOSAL | Status: DC | PRN
  Filled 2018-10-07: qty 9

## 2018-10-07 MED ORDER — FLEET ENEMA 7-19 GM/118ML RE ENEM: 1.0000 | ENEMA | Freq: Once | RECTAL | Status: DC | PRN

## 2018-10-07 MED ORDER — PROMETHAZINE HCL 25 MG/ML IJ SOLN
12.5000 mg | Freq: Once | INTRAMUSCULAR | Status: AC
  Administered 2018-10-07: 12.5 mg via INTRAVENOUS

## 2018-10-07 MED ORDER — MAGNESIUM HYDROXIDE 400 MG/5ML PO SUSP: 30.0000 mL | Freq: Every day | ORAL | Status: DC | PRN

## 2018-10-07 MED ORDER — HYDROMORPHONE HCL 1 MG/ML IJ SOLN
INTRAMUSCULAR | Status: AC
  Filled 2018-10-07: qty 1

## 2018-10-07 MED ORDER — OLANZAPINE 5 MG PO TABS
7.5000 mg | ORAL_TABLET | Freq: Every day | ORAL | Status: DC
  Administered 2018-10-07: 7.5 mg via ORAL
  Filled 2018-10-07: qty 2

## 2018-10-07 MED ORDER — HYDROMORPHONE HCL 1 MG/ML IJ SOLN
0.5000 mg | INTRAMUSCULAR | Status: DC | PRN
  Filled 2018-10-07: qty 1

## 2018-10-07 MED ORDER — LISINOPRIL 20 MG PO TABS
20.0000 mg | ORAL_TABLET | Freq: Every day | ORAL | Status: DC
  Administered 2018-10-08: 20 mg via ORAL
  Filled 2018-10-07: qty 1

## 2018-10-07 MED ORDER — GABAPENTIN 400 MG PO CAPS
1200.0000 mg | ORAL_CAPSULE | Freq: Three times a day (TID) | ORAL | Status: DC
  Administered 2018-10-07 – 2018-10-08 (×2): 1200 mg via ORAL
  Filled 2018-10-07 (×3): qty 3

## 2018-10-07 MED ORDER — OXYCODONE HCL 5 MG PO TABS
15.0000 mg | ORAL_TABLET | Freq: Four times a day (QID) | ORAL | Status: DC | PRN
  Administered 2018-10-07 – 2018-10-08 (×2): 15 mg via ORAL
  Filled 2018-10-07 (×2): qty 3

## 2018-10-07 MED ORDER — ASPIRIN EC 81 MG PO TBEC
81.0000 mg | DELAYED_RELEASE_TABLET | Freq: Every day | ORAL | Status: DC
  Administered 2018-10-08: 81 mg via ORAL
  Filled 2018-10-07: qty 1

## 2018-10-07 MED ORDER — BISACODYL 10 MG RE SUPP: 10.0000 mg | Freq: Every day | RECTAL | Status: DC | PRN

## 2018-10-07 MED ORDER — PHENOL 1.4 % MT LIQD
1.0000 | OROMUCOSAL | Status: DC | PRN
  Filled 2018-10-07: qty 177

## 2018-10-07 MED ORDER — LORAZEPAM 2 MG/ML IJ SOLN
1.0000 mg | Freq: Once | INTRAMUSCULAR | Status: AC
  Administered 2018-10-07: 1 mg via INTRAVENOUS

## 2018-10-07 MED ORDER — METOCLOPRAMIDE HCL 10 MG PO TABS: 5.0000 mg | ORAL_TABLET | Freq: Three times a day (TID) | ORAL | Status: DC | PRN

## 2018-10-07 MED ORDER — CEFAZOLIN SODIUM-DEXTROSE 2-4 GM/100ML-% IV SOLN
2.0000 g | Freq: Four times a day (QID) | INTRAVENOUS | Status: AC
  Administered 2018-10-07 (×4): 2 g via INTRAVENOUS
  Filled 2018-10-07 (×5): qty 100

## 2018-10-07 MED ORDER — HALOPERIDOL LACTATE 5 MG/ML IJ SOLN
2.0000 mg | Freq: Once | INTRAMUSCULAR | Status: AC
  Administered 2018-10-07: 2 mg via INTRAVENOUS
  Filled 2018-10-07: qty 1

## 2018-10-07 MED ORDER — SENNOSIDES-DOCUSATE SODIUM 8.6-50 MG PO TABS
1.0000 | ORAL_TABLET | Freq: Two times a day (BID) | ORAL | Status: DC
  Administered 2018-10-07: 1 via ORAL
  Filled 2018-10-07: qty 1

## 2018-10-07 NOTE — Evaluation (Signed)
Physical Therapy Evaluation Patient Details Name: Amy Sweeney MRN: [id number] DOB: 09/07/1951 Today's Date: 10/07/2018   History of Present Illness  Pt is a 68 y/o F s/p fall with resultant R femoral neck fx.  Pt now s/p R hip hemiarthroplasty.  Pt's PMH includes chronic pain an don chronic narcotics, bipolar disorder, COPD, DDD, PE, OCD, PTSD, cervical lemainoplasty, L THA, R TKA.      Clinical Impression  Patient is s/p above surgery resulting in functional limitations due to the deficits listed below (see PT Problem List). Ms. Accomando reports chronic pain at baseline as thus required rest breaks and cues for breathing relaxation techniques throughout session.  Despite her pain she put forth good effort with therapy.  She currently requires max +2 assist for bed mobility and mod +2 assist for sit<>stand and stand pivot transfers.  HR up to 135 with sit>stand, up from 98 at rest.  Given pt's current mobility status, recommending SNF at d/c. Patient will benefit from skilled PT to increase their independence and safety with mobility to allow discharge to the venue listed below.      Follow Up Recommendations SNF    Equipment Recommendations  3in1 (PT)    Recommendations for Other Services       Precautions / Restrictions Precautions Precautions: Fall;Posterior Hip Precaution Booklet Issued: Yes (comment) Precaution Comments: Instructed pt in posterior hip precautions and provided pt with handout Restrictions Weight Bearing Restrictions: Yes RLE Weight Bearing: Weight bearing as tolerated LLE Weight Bearing: Weight bearing as tolerated      Mobility  Bed Mobility Overal bed mobility: Needs Assistance Bed Mobility: Supine to Sit     Supine to sit: Max assist;+2 for physical assistance;HOB elevated     General bed mobility comments: Cues for sequencing and cues to maintain hip precautions.  Pt uses bed rail.  Max assist for all aspects of bed mobility and pt yelling out  due to pain.    Transfers Overall transfer level: Needs assistance Equipment used: Rolling walker (2 wheeled) Transfers: Sit to/from [employer] Sit to Stand: Mod assist;+2 physical assistance;From elevated surface Stand pivot transfers: Mod assist;+2 physical assistance;+2 safety/equipment       General transfer comment: Cues for hand and foot placement and assist to boost to standing and for controlled descent to sit.  Specific cues for positioning of RLE during transfer.  Cues for sequencing to pivot to chair and cues for upright posture as pt with flexed posture due to chronic back pain.    Ambulation/Gait             General Gait Details: Unable to attempt at this time.   Stairs            Wheelchair Mobility    Modified Rankin (Stroke Patients Only)       Balance Overall balance assessment: Needs assistance;History of Falls Sitting-balance support: Single extremity supported;Feet supported Sitting balance-Leahy Scale: Poor Sitting balance - Comments: Pt relies on at least 1UE support sitting EOB   Standing balance support: Bilateral upper extremity supported;During functional activity Standing balance-Leahy Scale: Poor Standing balance comment: Pt relies on BUE support and outside physical assist for static and dynamic activity                             Pertinent Vitals/Pain Pain Assessment: Faces Faces Pain Scale: Hurts worst Pain Location: R hip, chronic back pain Pain Descriptors / Indicators: Aching;Grimacing;Guarding;Constant;Moaning Pain  Intervention(s): Limited activity within patient's tolerance;Monitored during session;Premedicated before session;Utilized relaxation techniques;Repositioned    Home Living Family/patient expects to be discharged to:: Private residence Living Arrangements: Spouse/significant other Available Help at Discharge: Family;Available 24 hours/day Type of Home: House Home Access: Stairs to  enter Entrance Stairs-Rails: None(hoping to have ramp installed prior to d/c) Entrance Stairs-Number of Steps: 3 Home Layout: One level Home Equipment: Toilet riser;Shower seat;Walker - 2 wheels;Walker - 4 wheels;Wheelchair - manual;Cane - single point Additional Comments: Pt was in the process of moving to a new home when she fell.  Plan is to go to the new home upon d/c.     Prior Function Level of Independence: Needs assistance   Gait / Transfers Assistance Needed: Pt denies any additional falls in the past 6 months.  Ambulating up to 10 steps with walker or cane depending on how she felt each day.            Hand Dominance        Extremity/Trunk Assessment   Upper Extremity Assessment Upper Extremity Assessment: Overall WFL for tasks assessed         Cervical / Trunk Assessment Cervical / Trunk Assessment: Other exceptions Cervical / Trunk Exceptions: h/o DDD and chronic pain on chronic narcotics  Communication   Communication: No difficulties  Cognition Arousal/Alertness: Awake/alert Behavior During Therapy: Anxious(anxious about pain) Overall Cognitive Status: Within Functional Limits for tasks assessed                                        General Comments General comments (skin integrity, edema, etc.): SpO2 remains at or above 91% on RA throughout session.  HR up to 135 following sit>stand, up from 98 at rest in supine at start of session.  Cues for breathing relaxation techniques throughout session.     Exercises     Assessment/Plan    PT Assessment Patient needs continued PT services  PT Problem List Decreased strength;Decreased range of motion;Decreased activity tolerance;Decreased balance;Decreased mobility;Decreased knowledge of use of DME;Decreased safety awareness;Decreased knowledge of precautions;Pain       PT Treatment Interventions DME instruction;Gait training;Stair training;Functional mobility training;Therapeutic  activities;Therapeutic exercise;Balance training;Neuromuscular re-education;Patient/family education;Modalities;Wheelchair mobility training    PT Goals (Current goals can be found in the Care Plan section)  Acute Rehab PT Goals Patient Stated Goal: decreased pain PT Goal Formulation: With patient Time For Goal Achievement: 10/21/18 Potential to Achieve Goals: Fair    Frequency BID   Barriers to discharge Inaccessible home environment steps to enter home    Co-evaluation               AM-PAC PT "6 Clicks" Mobility  Outcome Measure Help needed turning from your back to your side while in a flat bed without using bedrails?: A Lot Help needed moving from lying on your back to sitting on the side of a flat bed without using bedrails?: A Lot Help needed moving to and from a bed to a chair (including a wheelchair)?: A Lot Help needed standing up from a chair using your arms (e.g., wheelchair or bedside chair)?: A Lot Help needed to walk in hospital room?: A Lot Help needed climbing 3-5 steps with a railing? : Total 6 Click Score: 11    End of Session Equipment Utilized During Treatment: Gait belt Activity Tolerance: Patient limited by pain Patient left: in chair;with call bell/phone within reach;with chair  alarm set;with SCD's reapplied Nurse Communication: Mobility status;Precautions;Other (comment)(HR with activity) PT Visit Diagnosis: Pain;Unsteadiness on feet (R26.81);Other abnormalities of gait and mobility (R26.89);Muscle weakness (generalized) (M62.81);Difficulty in walking, not elsewhere classified (R26.2) Pain - Right/Left: Right Pain - part of body: Hip    Time: [id number] PT Time Calculation (min) (ACUTE ONLY): 45 min   Charges:   PT Evaluation $PT Eval Moderate Complexity: 1 Mod PT Treatments $Therapeutic Exercise: 23-37 mins        Encarnacion ChuAshley Yaeli Hartung PT, DPT 10/07/2018, 11:36 AM

## 2018-10-07 NOTE — Progress Notes (Signed)
Subjective: 1 Day Post-Op Procedure(s) (LRB): ARTHROPLASTY BIPOLAR HIP (HEMIARTHROPLASTY) (Right) Patient reports pain as 9 on 0-10 scale.   Patient is well but does complain of full body pain this AM. PT and Care management to assist with discharge planning.  Will likely need SNF for short period of time. Negative for chest pain and shortness of breath Fever: no Gastrointestinal:Negative for nausea and vomiting  Objective: Vital signs in last 24 hours: Temp:  [97.8 F (36.6 C)-99.1 F (37.3 C)] 98.7 F (37.1 C) (01/24 0518) Pulse Rate:  [55-99] 87 (01/24 0518) Resp:  [7-19] 18 (01/24 0518) BP: (110-169)/(72-99) 137/76 (01/24 0518) SpO2:  [85 %-100 %] 99 % (01/24 0518)  Intake/Output from previous day:  Intake/Output Summary (Last 24 hours) at 10/07/2018 0755 Last data filed at 10/07/2018 0612 Gross per 24 hour  Intake 2030 ml  Output 1750 ml  Net 280 ml    Intake/Output this shift: No intake/output data recorded.  Labs: Recent Labs    10/06/18 0342 10/07/18 0440  HGB 12.7 12.1   Recent Labs    10/06/18 0342 10/07/18 0440  WBC 9.9 10.9*  RBC 4.11 3.91  HCT 38.4 37.7  PLT 279 270   Recent Labs    10/06/18 0342 10/07/18 0440  NA 131* 133*  K 3.8 4.1  CL 93* 97*  CO2 29 29  BUN 10 15  CREATININE 0.53 0.50  GLUCOSE 155* 172*  CALCIUM 8.1* 7.2*   No results for input(s): LABPT, INR in the last 72 hours.   EXAM General - Patient is Alert, Appropriate and Oriented Extremity - ABD soft Sensation intact distally Intact pulses distally Dorsiflexion/Plantar flexion intact Incision: dressing C/D/I No cellulitis present Dressing/Incision - clean, dry, no drainage, hemovac in place with mild bloody drainage. Motor Function - intact, moving foot and toes well on exam.   Past Medical History:  Diagnosis Date  . Anxiety   . Arthritis    degenerative artgritis  osteoarthritis  . Bipolar 1 disorder (HCC)   . Bipolar disorder (HCC)   . COPD (chronic  obstructive pulmonary disease) (HCC)    hx pulmonary embolism  . Coronary artery disease   . DDD (degenerative disc disease), cervical   . DDD (degenerative disc disease), cervical   . Depression   . Diabetes mellitus without complication (HCC)    Pt takes metformin.   Marland Kitchen Dyspnea   . Family history of adverse reaction to anesthesia    father blood clot after surgery died  . Headache    history of migraines  . History of pulmonary embolus (PE)   . History of shingles   . Hypertension   . Lower extremity deep venous thrombosis (HCC)   . Lower extremity edema   . 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 Night terrors   . OCD (obsessive compulsive disorder)   . Osteoarthritis   . Osteoporosis   . PTSD (post-traumatic stress disorder)   . Vitamin B 12 deficiency     Assessment/Plan: 1 Day Post-Op Procedure(s) (LRB): ARTHROPLASTY BIPOLAR HIP (HEMIARTHROPLASTY) (Right) Active Problems:   Closed right hip fracture (HCC)  Estimated body mass index is 37.1 kg/m as calculated from the following:   Height as of this encounter: 5\' 8"  (1.727 m).   Weight as of this encounter: 110.7 kg. Advance diet Up with therapy D/C IV fluids when tolerating po intake.  Labs reviewed this AM, WBC 10.9 Hg 12.1.  Hemovac to  remain in place today will plan on removal tomorrow. Up with therapy today. CBC and BMP ordered for tomorrow morning.  DVT Prophylaxis - Lovenox, Foot Pumps and TED hose Weight-Bearing as tolerated to right leg  J. Horris LatinoLance Crystelle Ferrufino, PA-C [facility] 10/07/2018, 7:55 AM

## 2018-10-07 NOTE — Progress Notes (Signed)
Patient called this nurse and a NT to room b/c she needed the bedpan. She asked for pain medicine prior to being moved, however it is too soon for her next dose. Patient refused to use bedpan to urinate until she has her pain medication. This nurse educated patient that she should not hold her urine for extended period of time. She stated that she "didn't care".

## 2018-10-07 NOTE — Progress Notes (Signed)
[facility]  ROUNDING NOTE   Subjective:   Na 133  Husband at bedside.   Right hip arthroplasty by Dr. Ernest Pine on 1/13.   Objective:  Vital signs in last 24 hours:  Temp:  [97.8 F (36.6 C)-99.1 F (37.3 C)] 98.8 F (37.1 C) (01/24 0845) Pulse Rate:  [55-99] 98 (01/24 0845) Resp:  [7-19] 18 (01/24 0845) BP: (110-169)/(72-99) 146/84 (01/24 0845) SpO2:  [85 %-100 %] 95 % (01/24 0845)  Weight change:  Filed Weights   10/02/18 2247  Weight: 110.7 kg    Intake/Output: I/O last 3 completed shifts: In: 2030 [P.O.:30; I.V.:2000] Out: 2225 [Urine:2035; Drains:40; Blood:150]   Intake/Output this shift:  No intake/output data recorded.  Physical Exam: General: NAD,   Head: Normocephalic, atraumatic. Moist oral mucosal membranes  Eyes: Anicteric, PERRL  Neck: Supple, trachea midline  Lungs:  Clear to auscultation  Heart: Regular rate and rhythm  Abdomen:  Soft, nontender,   Extremities: No peripheral edema.  Neurologic: Nonfocal, moving all four extremities  Skin: No lesions        Basic Metabolic Panel: Recent Labs  Lab 10/04/18 0445  10/04/18 1308  10/04/18 1753 10/05/18 0159 10/05/18 1014 10/06/18 0342 10/07/18 0440  NA 121*   < > 122*   < > 123* 127* 128* 131* 133*  K 3.3*  --  3.6  --   --  3.5  --  3.8 4.1  CL 85*  --  85*  --   --  90*  --  93* 97*  CO2 26  --  27  --   --  28  --  29 29  GLUCOSE 174*  --  164*  --   --  153*  --  155* 172*  BUN 7*  --  6*  --   --  8  --  10 15  CREATININE 0.40*  --  0.34*  --   --  0.50  --  0.53 0.50  CALCIUM 7.8*  --  7.7*  --   --  7.7*  --  8.1* 7.2*   < > = values in this interval not displayed.    Liver Function Tests: No results for input(s): AST, ALT, ALKPHOS, BILITOT, PROT, ALBUMIN in the last 168 hours. No results for input(s): LIPASE, AMYLASE in the last 168 hours. No results for input(s): AMMONIA in the last 168 hours.  CBC: Recent Labs  Lab 10/02/18 2252 10/03/18 0253  10/06/18 0342 10/07/18 0440  WBC 7.5 11.5* 9.9 10.9*  NEUTROABS 4.4  --   --   --   HGB 12.8 12.7 12.7 12.1  HCT 37.7 38.1 38.4 37.7  MCV 90.6 92.9 93.4 96.4  PLT 276 275 279 270    Cardiac Enzymes: Recent Labs  Lab 10/02/18 2252  TROPONINI <0.03    BNP: Invalid input(s): POCBNP  CBG: Recent Labs  Lab 10/06/18 0819 10/06/18 1153 10/06/18 1714 10/06/18 2249 10/07/18 0820  GLUCAP 135* 150* 159* 153* 167*    Microbiology: Results for orders placed or performed during the hospital encounter of 10/02/18  Surgical pcr screen     Status: None   Collection Time: 10/03/18  1:48 AM  Result Value Ref Range Status   MRSA, PCR NEGATIVE NEGATIVE Final   Staphylococcus aureus NEGATIVE NEGATIVE Final    Comment: (NOTE) The Xpert SA Assay (FDA approved for NASAL specimens in patients 23 years of age and older), is one component of a comprehensive surveillance program. It is not  intended to diagnose infection nor to guide or monitor treatment. Performed at [facility], [address] [place], [place] [address]   Urine Culture     Status: Abnormal (Preliminary result)   Collection Time: 10/05/18  7:49 PM  Result Value Ref Range Status   Specimen Description URINE, RANDOM  Final   Special Requests   Final    NONE Performed at [facility], [address], [place], [place] [address]    Culture 60,000 COLONIES/mL ENTEROCOCCUS FAECALIS (A)  Final   Report Status PENDING  Incomplete    Coagulation Studies: No results for input(s): LABPROT, INR in the last 72 hours.  Urinalysis: Recent Labs    10/05/18 1949  COLORURINE STRAW*  LABSPEC 1.003*  PHURINE 6.0  GLUCOSEU NEGATIVE  HGBUR MODERATE*  BILIRUBINUR NEGATIVE  KETONESUR 5*  PROTEINUR NEGATIVE  NITRITE NEGATIVE  LEUKOCYTESUR TRACE*      Imaging: Dg Hip Port Unilat With Pelvis 1v Right  Result Date: 10/06/2018 CLINICAL DATA:  Status post hemiarthroplasty. EXAM: DG HIP (WITH OR WITHOUT  PELVIS) 1V PORT RIGHT COMPARISON:  10/02/2018 FINDINGS: New right bipolar hip arthroplasty without complicating features or postoperative fracture. Left-sided hip arthroplasty is also noted with shallow appearing acetabulum. No joint dislocation is identified. The pubic rami appear intact. No diastasis of the pubic symphysis. IMPRESSION: 1. New right bipolar hip arthroplasty without complicating features. 2. Shallow appearing left acetabulum with uncemented intact hip arthroplasty. No dislocation or complicating features. Electronically Signed   By: Tollie Eth M.D.   On: 10/06/2018 23:36     Medications:   . sodium chloride 75 mL/hr at 10/07/18 0005  .  ceFAZolin (ANCEF) IV 2 g (10/07/18 0741)   . amLODipine  5 mg Oral Daily  . baclofen  5 mg Oral BID  . carvedilol  12.5 mg Oral BID WC  . clonazePAM  0.5 mg Oral BID  . diphenhydrAMINE  50-100 mg Oral QHS  . divalproex  1,500 mg Oral Daily  . DULoxetine  120 mg Oral Daily  . enoxaparin (LOVENOX) injection  40 mg Subcutaneous Q24H  . ferrous sulfate  650 mg Oral Q1400  . fluticasone  1 spray Each Nare BID  . gabapentin  1,200 mg Oral TID  . HYDROmorphone      . insulin aspart  0-5 Units Subcutaneous QHS  . insulin aspart  0-9 Units Subcutaneous TID WC  . irbesartan  300 mg Oral Daily  . latanoprost  1 drop Both Eyes QHS  . mouth rinse  15 mL Mouth Rinse BID  . nystatin   Topical BID  . OLANZapine  2.5 mg Oral QHS  . oxyCODONE  20 mg Oral Q12H  . sodium chloride flush      . traZODone  100 mg Oral QHS  . vitamin B-12  5,000 mcg Oral Q1400   acetaminophen, albuterol, bisacodyl, hydrALAZINE, HYDROmorphone (DILAUDID) injection, magnesium hydroxide, Melatonin, menthol-cetylpyridinium **OR** phenol, metoCLOPramide **OR** metoCLOPramide (REGLAN) injection, ondansetron **OR** ondansetron (ZOFRAN) IV, oxyCODONE, oxyCODONE, oxyCODONE, polyethylene glycol, sodium phosphate  Assessment/ Plan:  [name] is a 68 y.o. white female  with hypertension, diabetes mellitus type II, bipolar disorder, coronary artery disease, COPD, gastric bypass, chronic pain on chronic narcotics , who was admitted to [facility] on 10/02/2018 for right hip fracture  1. Hyponatremia: acute on chronic hyponatremia. Patient has history of polydipsia.  - tolvaptan 15mg  PO x 2 days - Continue fluid restriction  2. Hypertension: 146/84 - at goal.  Carvedilol, amlodipine  and irbesartan.  3. 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 On metformin as outpatient.  Hyperglycemia can cause dry mouth and polydipsia.  Hemoglobin A1c of 5.9%.    LOS: 4 Omarii Scalzo 1/24/202010:51 AM

## 2018-10-07 NOTE — Evaluation (Signed)
Occupational Therapy Evaluation Patient Details Name: Amy Sweeney MRN: [id number] DOB: [birth year] Today's Date: 10/07/2018    History of Present Illness Pt is a 68 y/o F s/p fall with resultant R femoral neck fx.  Pt now s/p R hip hemiarthroplasty.  Pt's PMH includes chronic pain and on chronic narcotics, bipolar disorder, COPD, DDD, PE, OCD, PTSD, cervical lemainoplasty, L THA, R TKA.     Clinical Impression    Pt. is a 68 y.o. female who was [facility] to [facility] for a right femoral neck fracture and s/p hemiarthroplasty. 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  Further hx listed above.  Pt.had just finished with PT and was up in the recliner and presents with pain 10/10, limited ROM, weakness, limited activity tolerance, and impaired functional mobility for ADLs.She required mod assist and cues for LB dressing using reacher and sock aid and refused to have feet down on floor due to pain in R hip and lower back.  She could not verbalize her posterior hip precautions so they were reviewed again.  Pt. would benefit from skilled OT services for ADL and A/E retraining, and functional mobility for ADLs in order to improve overall ADL functioning, and return to PLOF.  She needed help from her husband prior to admission for ADLs and rec SNF after discharge.    Follow Up Recommendations  SNF    Equipment Recommendations  Tub/shower seat;Other (comment)(with a back on it, reacher, sock aid and LH shoe horn)    Recommendations for Other Services       Precautions / Restrictions Precautions Precautions: Fall;Posterior Hip Precaution Booklet Issued: Yes (comment) Precaution Comments: reviewed posterior hip precautions Restrictions Weight Bearing Restrictions: Yes RLE Weight Bearing: Weight bearing as tolerated LLE Weight Bearing: Weight bearing as tolerated      Mobility Bed Mobility Overal bed mobility: Needs Assistance Bed Mobility: Supine to Sit      Supine to sit: Max assist;+2 for physical assistance;HOB elevated     General bed mobility comments: Cues for sequencing and cues to maintain hip precautions.  Pt uses bed rail.  Max assist for all aspects of bed mobility and pt yelling out due to pain.    Transfers Overall transfer level: Needs assistance Equipment used: Rolling walker (2 wheeled) Transfers: Sit to/from [employer]Stand;Stand Pivot Transfers Sit to Stand: Mod assist;+2 physical assistance;From elevated surface Stand pivot transfers: Mod assist;+2 physical assistance;+2 safety/equipment       General transfer comment: Cues for hand and foot placement and assist to boost to standing and for controlled descent to sit.  Specific cues for positioning of RLE during transfer.  Cues for sequencing to pivot to chair and cues for upright posture as pt with flexed posture due to chronic back pain.      Balance Overall balance assessment: Needs assistance;History of Falls Sitting-balance support: Single extremity supported;Feet supported Sitting balance-Leahy Scale: Poor Sitting balance - Comments: Pt relies on at least 1UE support sitting EOB   Standing balance support: Bilateral upper extremity supported;During functional activity Standing balance-Leahy Scale: Poor Standing balance comment: Pt relies on BUE support and outside physical assist for static and dynamic activity                           ADL either performed or assessed with clinical judgement   ADL Overall ADL's : Needs assistance/impaired Eating/Feeding: Independent;Set up   Grooming: Wash/dry hands;Wash/dry face;Oral care;Brushing hair;Set up;Sitting;Modified  independent Grooming Details (indicate cue type and reason): pt has slow movements to complete tasks and needs extra time to complete and cues to stay focused on task but able to complete with set up  and cues.         Upper Body Dressing : Set up;Minimal assistance;Sitting Upper Body Dressing  Details (indicate cue type and reason): extra time and cues to not lean too far forward due to hip precautions and needs more time due to chronic pain in back.   Lower Body Dressing: Maximal assistance;Set up;With adaptive equipment Lower Body Dressing Details (indicate cue type and reason): Pt practiced use of reacher and sock aid again with extra time for processing and sequencing for use and only able to tolerate practicing for donning and doffing socks with legs elevated in recliner and refused to have feet on floor due to pain 9/10.               General ADL Comments: Pt is very limited in mobility due to chronic pain and PT reported max assist of 2 with pt screaming out in pain for bed mobility and transfer to recliner.  Husband was in the bathroom which was not known until housekeeping came in to clean the bathroom and pt did not indicate he was in the bathroom.  He came out of restroom and did not acknowledge greeting by therapist and kissed his wife good bye and left the room to run errands.       Vision Patient Visual Report: No change from baseline       Perception     Praxis      Pertinent Vitals/Pain Pain Assessment: Faces Pain Score: 10-Worst pain ever Faces Pain Scale: Hurts whole lot Pain Location: R hip, chronic back pain Pain Descriptors / Indicators: Aching;Grimacing;Guarding;Constant;Moaning Pain Intervention(s): Limited activity within patient's tolerance;Monitored during session;Premedicated before session;Repositioned;Utilized relaxation techniques     Hand Dominance Right   Extremity/Trunk Assessment Upper Extremity Assessment Upper Extremity Assessment: Overall WFL for tasks assessed   Lower Extremity Assessment Lower Extremity Assessment: Defer to PT evaluation   Cervical / Trunk Assessment Cervical / Trunk Assessment: Other exceptions Cervical / Trunk Exceptions: h/o DDD and chronic pain on chronic narcotics   Communication  Communication Communication: No difficulties   Cognition Arousal/Alertness: Awake/alert Behavior During Therapy: Anxious Overall Cognitive Status: Within Functional Limits for tasks assessed                                     General Comments  SpO2 remains at or above 91% on RA throughout session.  HR up to 135 following sit>stand, up from 98 at rest in supine at start of session.  Cues for breathing relaxation techniques throughout session.     Exercises     Shoulder Instructions      Home Living Family/patient expects to be discharged to:: Private residence Living Arrangements: Spouse/significant other Available Help at Discharge: Family;Available 24 hours/day Type of Home: House Home Access: Stairs to enter [employer] of Steps: 3 Entrance Stairs-Rails: None Home Layout: One level     Bathroom Shower/Tub: Producer, television/film/video: Handicapped height Bathroom Accessibility: Yes How Accessible: Accessible via walker;Accessible via wheelchair Home Equipment: Toilet riser;Shower seat;Walker - 2 wheels;Walker - 4 wheels;Wheelchair - manual;Cane - single point   Additional Comments: Pt was in the process of moving to a new home when she fell.  Plan is to go to the new home upon d/c.       Prior Functioning/Environment Level of Independence: Needs assistance  Gait / Transfers Assistance Needed: Pt denies any additional falls in the past 6 months.  Ambulating up to 10 steps with walker or cane depending on how she felt each day.  ADL's / Homemaking Assistance Needed: Pt required help with bathing and dressing from her husband which flucutated based on her level of pain that day.  Husband does all cooking, cleaning and grocery shopping and bill paying.            OT Problem List: Decreased strength;Impaired balance (sitting and/or standing);Pain;Decreased activity tolerance;Decreased knowledge of use of DME or AE;Decreased knowledge of  precautions      OT Treatment/Interventions: Self-care/ADL training;Therapeutic activities;Balance training;DME and/or AE instruction;Patient/family education    OT Goals(Current goals can be found in the care plan section) Acute Rehab OT Goals Patient Stated Goal: To get pain under control OT Goal Formulation: With patient Time For Goal Achievement: 10/21/18 Potential to Achieve Goals: Fair  OT Frequency: Min 2X/week   Barriers to D/C:            Co-evaluation              AM-PAC OT "6 Clicks" Daily Activity     Outcome Measure Help from another person eating meals?: None Help from another person taking care of personal grooming?: None Help from another person toileting, which includes using toliet, bedpan, or urinal?: A Lot Help from another person bathing (including washing, rinsing, drying)?: A Lot Help from another person to put on and taking off regular upper body clothing?: A Little Help from another person to put on and taking off regular lower body clothing?: A Lot 6 Click Score: 17   End of Session Nurse Communication: Other (comment)(IV beeping and needed more ice water)  Activity Tolerance: Patient limited by pain Patient left: in chair;with call bell/phone within reach;with chair alarm set  OT Visit Diagnosis: Unsteadiness on feet (R26.81);Pain;Muscle weakness (generalized) (M62.81) Pain - Right/Left: Right Pain - part of body: Hip                Time: 1125-1205 OT Time Calculation (min): 40 min Charges:  OT General Charges $OT Visit: 1 Visit OT Evaluation $OT Eval Low Complexity: 1 Low OT Treatments $Self Care/Home Management : 23-37 mins  Susanne Borders, OTR/L ascom [phone] 10/07/18, 12:34 PM

## 2018-10-07 NOTE — Progress Notes (Signed)
[facility] [place] [facility]   PATIENT NAME: Amy Sweeney    MR#:  [id number]  DATE OF BIRTH:  01-09-1951  SUBJECTIVE:   Patient states she is having a lot of pain this morning.  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  She is also having some right hip pain that radiates down her entire right leg.  No numbness or tingling.  REVIEW OF SYSTEMS:  Review of Systems  Constitutional: Negative for chills, fever and malaise/fatigue.  HENT: Negative for congestion, ear discharge, hearing loss and nosebleeds.   Eyes: Negative for blurred vision and double vision.  Respiratory: Negative for cough, shortness of breath and wheezing.   Cardiovascular: Negative for chest pain and palpitations.  Gastrointestinal: Negative for abdominal pain, constipation, diarrhea, nausea and vomiting.  Genitourinary: Negative for dysuria.  Musculoskeletal: Positive for back pain, joint pain, myalgias and neck pain.  Neurological: Negative for dizziness, focal weakness, seizures, weakness and headaches.  Psychiatric/Behavioral: Negative for depression.    DRUG ALLERGIES:   Allergies  Allergen Reactions  . Bee Venom Anaphylaxis  . Morphine And Related Anaphylaxis    Blisters itching, N&V, face and throat swelling  . Adhesive [Tape] Other (See Comments)    Itching and water blisters. Paper tape is OK.  . Celebrex [Celecoxib] Swelling  . Tegaderm Ag Mesh [Silver] Other (See Comments)    itching and water blisters itching and water blisters  . Grape Seed Itching and Swelling    Lips and mouth swelling/itching.  Grapes not grape seed  . Latuda [Lurasidone Hcl] Other (See Comments)  . Tapentadol Other (See Comments)    VITALS:  Blood pressure (!) 146/84, pulse 98, temperature 98.8 F (37.1 C), temperature source Oral, resp. rate 18, height 5\' 8"  (1.727 m), weight 110.7 kg, SpO2 95 %.  PHYSICAL EXAMINATION:  Physical Exam   GENERAL:  68 y.o.-year-old obese  patient lying in the bed, in NAD. EYES: Pupils equal, round, reactive to light and accommodation. No scleral icterus. Extraocular muscles intact.  HEENT: Head atraumatic, normocephalic. Oropharynx and nasopharynx clear.  NECK:  Supple, no jugular venous distention. No thyroid enlargement, no tenderness.  LUNGS: Normal breath sounds bilaterally, no wheezing, rales,rhonchi or crepitation. No use of accessory muscles of respiration.  Decreased bibasilar breath sounds CARDIOVASCULAR: RRR, S1, S2 normal. No murmurs, rubs, or gallops.  ABDOMEN: Soft, obese, nontender, nondistended. Bowel sounds present. No organomegaly or mass.  EXTREMITIES: No pedal edema, cyanosis, or clubbing.  Dressing on right lateral hip is clean and dry.  Hemovac in place. NEUROLOGIC: Cranial nerves II through XII are intact. +decreased strength in right leg secondary to pain. Sensation intact. Gait not checked.  PSYCHIATRIC: The patient is alert and oriented x 3.  SKIN: No obvious rash, lesion, or ulcer.    LABORATORY PANEL:   CBC Recent Labs  Lab 10/07/18 0440  WBC 10.9*  HGB 12.1  HCT 37.7  PLT 270   ------------------------------------------------------------------------------------------------------------------  Chemistries  Recent Labs  Lab 10/07/18 0440  NA 133*  K 4.1  CL 97*  CO2 29  GLUCOSE 172*  BUN 15  CREATININE 0.50  CALCIUM 7.2*   ------------------------------------------------------------------------------------------------------------------  Cardiac Enzymes Recent Labs  Lab 10/02/18 2252  TROPONINI <0.03   ------------------------------------------------------------------------------------------------------------------  RADIOLOGY:  Dg Hip Port Unilat With Pelvis 1v Right  Result Date: 10/06/2018 CLINICAL DATA:  Status post hemiarthroplasty. EXAM: DG HIP (WITH OR WITHOUT PELVIS) 1V PORT RIGHT COMPARISON:  10/02/2018 FINDINGS: New right bipolar hip arthroplasty  without complicating  features or postoperative fracture. Left-sided hip arthroplasty is also noted with shallow appearing acetabulum. No joint dislocation is identified. The pubic rami appear intact. No diastasis of the pubic symphysis. IMPRESSION: 1. New right bipolar hip arthroplasty without complicating features. 2. Shallow appearing left acetabulum with uncemented intact hip arthroplasty. No dislocation or complicating features. Electronically Signed   By: Tollie Ethavid  Kwon M.D.   On: 10/06/2018 23:36    EKG:   Orders placed or performed during the hospital encounter of 10/02/18  . EKG 12-Lead  . EKG 12-Lead  . ED EKG  . ED EKG  . EKG    ASSESSMENT AND PLAN:   1.  Mechanical fall and right femoral neck fracture- s/p right hip hemiarthroplasty on 10/06/2018 -Ortho following- weight-bearing as tolerated to right leg -Continue IV pain medications -PT eval pending  2.  Hyponatremia- likely due to polydipsia.  Sodium has improved to 133 today. -Nephrology following -Received tolvaptan for 2 days, but this has been stopped.  3. Hypertension- continue Avapro and Coreg  4. Type 2 diabetes- takes metformin at home -Sensitive SSI  5. COPD- stable, no signs of acute exacerbation -Albuterol nebs prn  6. CAD- stable, no active chest pain -Continue aspirin, coreg, ACE  7. History of DVT/PE- previously on eliquis, but is no longer taking this. -Heparin for DVT prophylaxis  8. Depression/anxiety/bipolar disorder- stable -Continue cymbalta, zyprexa, trazodone, klonopin, depakote  All the records are reviewed and case discussed with Care Management/Social Worker. Management plans discussed with the patient, family and they are in agreement.  CODE STATUS: Full Code  TOTAL TIME TAKING CARE OF THIS PATIENT: 35 minutes.   POSSIBLE D/C IN 1-2 days, DEPENDING ON CLINICAL CONDITION.   Jinny BlossomKaty D Mayo M.D on 10/07/2018 at 10:46 AM  Between 7am to 6pm - Pager - [phone]  After 6pm go to [email or web address] -  Social research officer, governmentpassword EPAS [facility]  [facility]  Office  [phone]  CC: Primary care physician; Barbette ReichmannHande, Vishwanath, MD

## 2018-10-07 NOTE — Progress Notes (Signed)
Plan is for patient to D/C to [facility] when medically stable. Patient is aware of above. [facility] liaison is aware of above.   [employer], LCSW [phone]

## 2018-10-07 NOTE — Progress Notes (Signed)
Physical Therapy Treatment Patient Details Name: Amy Sweeney MRN: [id number] DOB: 02/11/1951 Today's Date: 10/07/2018    History of Present Illness Pt is a 68 y/o F s/p fall with resultant R femoral neck fx.  Pt now s/p R hip hemiarthroplasty.  Pt's PMH includes chronic pain an don chronic narcotics, bipolar disorder, COPD, DDD, PE, OCD, PTSD, cervical lemainoplasty, L THA, R TKA.      PT Comments    Ms. Bole participated in therapeutic exercises this session but otherwise was unable to make progress toward mobility goals due to pain.  HR up to 143 following stand pivot transfer.  Pt requires max verbal cues and encouragement throughout session with all aspects of mobility.  Follow up recommendations remain appropriate.    Follow Up Recommendations  SNF     Equipment Recommendations  3in1 (PT)    Recommendations for Other Services       Precautions / Restrictions Precautions Precautions: Fall;Posterior Hip Precaution Comments: Pt able to recall 1/3 hip precautions, reviewed in full with pt.  Restrictions Weight Bearing Restrictions: Yes RLE Weight Bearing: Weight bearing as tolerated LLE Weight Bearing: Weight bearing as tolerated    Mobility  Bed Mobility Overal bed mobility: Needs Assistance Bed Mobility: Sit to Supine       Sit to supine: Max assist;+2 for physical assistance   General bed mobility comments: Assist for all aspects of bed mobility.  Pt yelling out due to pain.   Transfers Overall transfer level: Needs assistance Equipment used: Rolling walker (2 wheeled) Transfers: Sit to/from [employer] Sit to Stand: Mod assist;+2 physical assistance;From elevated surface Stand pivot transfers: Mod assist;+2 physical assistance;+2 safety/equipment       General transfer comment: Cues for hand placement and RLE placement.  Pt unsuccessful to stand from chair on first attempt due to pain and feet sliding forward with posterior lean.   Assist to boost to standing.  To sit pt requires assist to control descent to sit.  When pivoting pt requires max verbal cues for sequencing, posture, and to back up all the way to the bed in prep for sitting.   Ambulation/Gait             General Gait Details: Unable to attempt at this time.    Stairs             Wheelchair Mobility    Modified Rankin (Stroke Patients Only)       Balance Overall balance assessment: Needs assistance;History of Falls Sitting-balance support: Single extremity supported;Feet supported Sitting balance-Leahy Scale: Poor Sitting balance - Comments: Pt relies on at least 1UE support sitting EOB   Standing balance support: Bilateral upper extremity supported;During functional activity Standing balance-Leahy Scale: Poor Standing balance comment: Pt relies on BUE support and outside physical assist for static and dynamic activity                            Cognition Arousal/Alertness: Awake/alert Behavior During Therapy: Anxious(anxious about pain) Overall Cognitive Status: Within Functional Limits for tasks assessed                                        Exercises General Exercises - Lower Extremity Ankle Circles/Pumps: AROM;Both;10 reps;Seated Long Arc Quad: AROM;Both;10 reps;Seated    General Comments General comments (skin integrity, edema, etc.): HR 107 sitting at edge  of chair, up to 143 following stand pivot tranfer, down to 102 in supine at end of session.       Pertinent Vitals/Pain Pain Assessment: Faces Pain Score: 10-Worst pain ever Faces Pain Scale: Hurts worst Pain Location: R hip, chronic back pain Pain Descriptors / Indicators: Aching;Grimacing;Guarding;Constant;Moaning Pain Intervention(s): Monitored during session;Limited activity within patient's tolerance;Repositioned;Utilized relaxation techniques    Home Living Family/patient expects to be discharged to:: Private residence Living  Arrangements: Spouse/significant other Available Help at Discharge: Family;Available 24 hours/day Type of Home: House Home Access: Stairs to enter Entrance Stairs-Rails: None Home Layout: One level Home Equipment: Toilet riser;Shower seat;Walker - 2 wheels;Walker - 4 wheels;Wheelchair - manual;Cane - single point Additional Comments: Pt was in the process of moving to a new home when she fell.  Plan is to go to the new home upon d/c.     Prior Function Level of Independence: Needs assistance  Gait / Transfers Assistance Needed: Pt denies any additional falls in the past 6 months.  Ambulating up to 10 steps with walker or cane depending on how she felt each day.  ADL's / Homemaking Assistance Needed: Pt required help with bathing and dressing from her husband which flucutated based on her level of pain that day.  Husband does all cooking, cleaning and grocery shopping and bill paying.     PT Goals (current goals can now be found in the care plan section) Acute Rehab PT Goals Patient Stated Goal: decreased pain PT Goal Formulation: With patient Time For Goal Achievement: 10/21/18 Potential to Achieve Goals: Fair Progress towards PT goals: Not progressing toward goals - comment(due to pain)    Frequency    BID      PT Plan Current plan remains appropriate    Co-evaluation              AM-PAC PT "6 Clicks" Mobility   Outcome Measure  Help needed turning from your back to your side while in a flat bed without using bedrails?: A Lot Help needed moving from lying on your back to sitting on the side of a flat bed without using bedrails?: A Lot Help needed moving to and from a bed to a chair (including a wheelchair)?: A Lot Help needed standing up from a chair using your arms (e.g., wheelchair or bedside chair)?: A Lot Help needed to walk in hospital room?: A Lot Help needed climbing 3-5 steps with a railing? : Total 6 Click Score: 11    End of Session Equipment Utilized  During Treatment: Gait belt Activity Tolerance: Patient limited by pain Patient left: with call bell/phone within reach;with SCD's reapplied;in bed;with bed alarm set Nurse Communication: Mobility status;Other (comment)(HR readings) PT Visit Diagnosis: Pain;Unsteadiness on feet (R26.81);Other abnormalities of gait and mobility (R26.89);Muscle weakness (generalized) (M62.81);Difficulty in walking, not elsewhere classified (R26.2) Pain - Right/Left: Right Pain - part of body: Hip     Time: [id number] PT Time Calculation (min) (ACUTE ONLY): 30 min  Charges:  $Therapeutic Exercise: 8-22 mins $Therapeutic Activity: 8-22 mins                     Encarnacion ChuAshley Abashian PT, DPT 10/07/2018, 3:33 PM

## 2018-10-08 LAB — URINE CULTURE: Culture: 60000 — AB

## 2018-10-08 LAB — GLUCOSE, CAPILLARY
GLUCOSE-CAPILLARY: 168 mg/dL — AB (ref 70–99)
Glucose-Capillary: 192 mg/dL — ABNORMAL HIGH (ref 70–99)

## 2018-10-08 LAB — CBC
HCT: 33.5 % — ABNORMAL LOW (ref 36.0–46.0)
Hemoglobin: 10.8 g/dL — ABNORMAL LOW (ref 12.0–15.0)
MCH: 30.4 pg (ref 26.0–34.0)
MCHC: 32.2 g/dL (ref 30.0–36.0)
MCV: 94.4 fL (ref 80.0–100.0)
PLATELETS: 269 10*3/uL (ref 150–400)
RBC: 3.55 MIL/uL — ABNORMAL LOW (ref 3.87–5.11)
RDW: 12.9 % (ref 11.5–15.5)
WBC: 9.5 10*3/uL (ref 4.0–10.5)
nRBC: 0 % (ref 0.0–0.2)

## 2018-10-08 LAB — BASIC METABOLIC PANEL
Anion gap: 4 — ABNORMAL LOW (ref 5–15)
BUN: 9 mg/dL (ref 8–23)
CALCIUM: 7.1 mg/dL — AB (ref 8.9–10.3)
CO2: 30 mmol/L (ref 22–32)
Chloride: 96 mmol/L — ABNORMAL LOW (ref 98–111)
Creatinine, Ser: 0.55 mg/dL (ref 0.44–1.00)
GFR calc Af Amer: >60 mL/min (ref >60)
Glucose, Bld: 156 mg/dL — ABNORMAL HIGH (ref 70–99)
Potassium: 3.6 mmol/L (ref 3.5–5.1)
Sodium: 130 mmol/L — ABNORMAL LOW (ref 135–145)

## 2018-10-08 MED ORDER — ENOXAPARIN SODIUM 40 MG/0.4ML ~~LOC~~ SOLN: 40.0000 mg | SUBCUTANEOUS | 0 refills | Status: DC

## 2018-10-08 MED ORDER — OXYCODONE HCL 15 MG PO TABS: 15.0000 mg | ORAL_TABLET | Freq: Four times a day (QID) | ORAL | 0 refills | Status: AC | PRN

## 2018-10-08 MED ORDER — OXYCODONE HCL ER 10 MG PO T12A: 20.0000 mg | EXTENDED_RELEASE_TABLET | Freq: Two times a day (BID) | ORAL | 0 refills | Status: AC

## 2018-10-08 MED ORDER — SENNOSIDES-DOCUSATE SODIUM 8.6-50 MG PO TABS: 1.0000 | ORAL_TABLET | Freq: Two times a day (BID) | ORAL | 0 refills | Status: DC

## 2018-10-08 MED ORDER — NYSTATIN 100000 UNIT/GM EX POWD: Freq: Two times a day (BID) | CUTANEOUS | 0 refills | Status: DC

## 2018-10-08 MED ORDER — POLYETHYLENE GLYCOL 3350 17 G PO PACK: 17.0000 g | PACK | Freq: Every day | ORAL | 0 refills | Status: DC

## 2018-10-08 NOTE — Discharge Summary (Addendum)
[facility] NAME: Amy Sweeney    MR#:  [id number]  DATE OF BIRTH:  06-07-1951  DATE OF ADMISSION:  10/02/2018   ADMITTING PHYSICIAN: Sela Hua, MD  DATE OF DISCHARGE: 10/08/18  PRIMARY CARE PHYSICIAN: Tracie Harrier, MD   ADMISSION DIAGNOSIS:  Hyponatremia [E87.1] Right hip pain [M25.551] Right elbow pain [M25.521] Fall, initial encounter [W19.XXXA] Closed fracture of right hip, initial encounter ([place]) [S72.001A] DISCHARGE DIAGNOSIS:  Active Problems:   Closed right hip fracture ([place])  SECONDARY DIAGNOSIS:   Past Medical History:  Diagnosis Date  . Anxiety   . Arthritis    degenerative artgritis  osteoarthritis  . Bipolar 1 disorder ([place])   . Bipolar disorder ([place])   . COPD (chronic obstructive pulmonary disease) (HCC)    hx pulmonary embolism  . Coronary artery disease   . DDD (degenerative disc disease), cervical   . DDD (degenerative disc disease), cervical   . Depression   . Diabetes mellitus without complication ([place])    Pt takes metformin.   Marland Kitchen Dyspnea   . Family history of adverse reaction to anesthesia    father blood clot after surgery died  . Headache    history of migraines  . History of pulmonary embolus (PE)   . History of shingles   . Hypertension   . Lower extremity deep venous thrombosis (HCC)   . Lower extremity edema   . 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 Night terrors   . OCD (obsessive compulsive disorder)   . Osteoarthritis   . Osteoporosis   . PTSD (post-traumatic stress disorder)   . Vitamin B 12 deficiency    HOSPITAL COURSE:   Amy Sweeney is a 68 year old female who presented to the ED with right lateral hip pain after mechanical fall at home.  Right hip x-ray showed right femoral neck fracture.  She was admitted for further management.  1.  Mechanical fall and right femoral neck  fracture- s/p right hip hemiarthroplasty on 10/06/2018 -Ortho following- recommended that patient be weightbearing as tolerated to the right leg.  Needs to stay on Lovenox for DVT prophylaxis. -Will need to follow-up with Ortho as an outpatient  2.  Hyponatremia- likely due to polydipsia.  -Seen by nephrology -Received tolvaptan for 2 days -Sodium improved with fluid restriction- will need to have 1200 mL fluid restriction at SNF -Needs to follow-up with nephrology as an outpatient  3. Hypertension- continued Avapro and Coreg  4. Type 2 diabetes -Treated with SSI here -Metformin continued on discharge  5. COPD- stable, no signs of acute exacerbation -Albuterol nebs prn  6. CAD-stable, no active chest pain -Continued aspirin, coreg, ACE  7. History of DVT/PE- previously on eliquis, but is no longer taking this. -Placed on Lovenox per Ortho recommendations  8. Depression/anxiety/bipolar disorder- stable -Continuedcymbalta,zyprexa, trazodone, klonopin, depakote  DISCHARGE CONDITIONS:  Right hip fracture s/p right hip hemiarthroplasty Hyponatremia-improved Hypertension Type 2 diabetes COPD CAD History DVT/PE Depression Anxiety Bipolar disorder Chronic pain syndrome CONSULTS OBTAINED:  Treatment Team:  Dereck Leep, MD DRUG ALLERGIES:   Allergies  Allergen Reactions  . Bee Venom Anaphylaxis  . Morphine And Related Anaphylaxis    Blisters itching, N&V, face and throat swelling  . Adhesive [Tape] Other (See Comments)    Itching and water blisters. Paper tape is OK.  . Celebrex [Celecoxib] Swelling  . Tegaderm Ag Mesh [Silver] Other (See Comments)  itching and water blisters itching and water blisters  . Grape Seed Itching and Swelling    Lips and mouth swelling/itching.  Grapes not grape seed  . Latuda [Lurasidone Hcl] Other (See Comments)  . Tapentadol Other (See Comments)   DISCHARGE MEDICATIONS:   Allergies as of 10/08/2018      Reactions    Bee Venom Anaphylaxis   Morphine And Related Anaphylaxis   Blisters itching, N&V, face and throat swelling   Adhesive [tape] Other (See Comments)   Itching and water blisters. Paper tape is OK.   Celebrex [celecoxib] Swelling   Tegaderm Ag Mesh [silver] Other (See Comments)   itching and water blisters itching and water blisters   Grape Seed Itching, Swelling   Lips and mouth swelling/itching.  Grapes not grape seed   Latuda [lurasidone Hcl] Other (See Comments)   Tapentadol Other (See Comments)      Medication List    STOP taking these medications   aspirin 81 MG tablet   Melatonin 1 MG Tabs     TAKE these medications   acetaminophen 500 MG tablet Commonly known as:  TYLENOL Take 1,500 mg by mouth 3 (three) times daily. With Gabapentin   amLODipine 5 MG tablet Commonly known as:  NORVASC Take 5 mg by mouth daily.   atorvastatin 20 MG tablet Commonly known as:  LIPITOR Take 1 tablet by mouth at bedtime.   baclofen 10 MG tablet Commonly known as:  LIORESAL Take 5 mg by mouth 2 (two) times daily.   bimatoprost 0.01 % Soln Commonly known as:  LUMIGAN Place 1 drop into both eyes at bedtime.   CALCIUM CITRATE PO Take 1,200 mg by mouth daily at 2 PM.   carvedilol 12.5 MG tablet Commonly known as:  COREG Take 12.5 mg by mouth 2 (two) times daily with a meal.   denosumab 60 MG/ML Soln injection Commonly known as:  PROLIA Inject 60 mg into the skin every 6 (six) months. Administer in upper arm, thigh, or abdomen   diphenhydrAMINE 25 MG tablet Commonly known as:  SOMINEX Take 50 mg by mouth at bedtime. 50 mg at bedtime, if patient is manic due to BIPOLAR 1 she will take 100 mg at bedtime.   divalproex 500 MG 24 hr tablet Commonly known as:  DEPAKOTE ER Take 1,500 mg by mouth daily.   DULoxetine 60 MG capsule Commonly known as:  CYMBALTA Take 120 mg by mouth daily.   enoxaparin 40 MG/0.4ML injection Commonly known as:  LOVENOX Inject 0.4 mLs (40 mg total)  into the skin daily.   ferrous sulfate 325 (65 FE) MG tablet Take 650 mg by mouth every other day.   fluticasone 50 MCG/ACT nasal spray Commonly known as:  FLONASE Place 1 spray into both nostrils 2 (two) times daily.   gabapentin 400 MG capsule Commonly known as:  NEURONTIN Take 1,200 mg by mouth 3 (three) times daily. With Tylenol 1500 mg   Ginkgo Biloba 60 MG Caps Take 120 mg by mouth daily at 2 PM.   lisinopril 20 MG tablet Commonly known as:  PRINIVIL,ZESTRIL Take 20 mg by mouth daily.   Magnesium Oxide -Mg Supplement 250 MG Tabs Take 500 mg by mouth daily at 2 PM.   metFORMIN 1000 MG tablet Commonly known as:  GLUCOPHAGE Take 2,000 mg by mouth daily with breakfast.   naloxone 4 MG/0.1ML Liqd nasal spray kit Commonly known as:  NARCAN Place 1 spray into the nose once.   nitroGLYCERIN 0.4 MG  SL tablet Commonly known as:  NITROSTAT Place 0.4 mg under the tongue every 5 (five) minutes as needed. For chest pain.   OLANZapine 2.5 MG tablet Commonly known as:  ZYPREXA Take 7.5 mg by mouth at bedtime.   ONE-A-DAY WOMENS 50 PLUS PO Take 2 tablets by mouth daily at 2 PM.   oxyCODONE 15 MG immediate release tablet Commonly known as:  ROXICODONE Take 1 tablet (15 mg total) by mouth every 6 (six) hours as needed.   oxyCODONE 10 mg 12 hr tablet Commonly known as:  OXYCONTIN Take 2 tablets (20 mg total) by mouth every 12 (twelve) hours.   protein supplement shake Liqd Commonly known as:  PREMIER PROTEIN Take 11 oz by mouth daily with breakfast. CARAMEL OR STRAWBERRY PREFERRED   traZODone 100 MG tablet Commonly known as:  DESYREL Take 1 tablet (100 mg total) by mouth at bedtime.   Vitamin B-12 2500 MCG Subl Place 5,000 mcg under the tongue daily at 2 PM.   VITAMIN D3 PO Take 1,200 Units by mouth daily at 2 PM.   vitamin E 1000 UNIT capsule Take 2,000 Units by mouth at bedtime.   Zinc 50 MG Tabs Take 100 mg by mouth daily at 2 PM.        DISCHARGE  INSTRUCTIONS:   GENERAL:  68 y.o.-year-old obese patient lying in the bed, in NAD. EYES: Pupils equal, round, reactive to light and accommodation. No scleral icterus. Extraocular muscles intact.  HEENT: Head atraumatic, normocephalic. Oropharynx and nasopharynx clear.  NECK:  Supple, no jugular venous distention. No thyroid enlargement, no tenderness.  LUNGS: Normal breath sounds bilaterally, no wheezing, rales,rhonchi or crepitation. No use of accessory muscles of respiration.  Decreased bibasilar breath sounds CARDIOVASCULAR: RRR, S1, S2 normal. No murmurs, rubs, or gallops.  ABDOMEN: Soft, obese, nontender, nondistended. Bowel sounds present. No organomegaly or mass.  EXTREMITIES: No pedal edema, cyanosis, or clubbing.  Dressing on right lateral hip is clean and dry.  Hemovac in place. NEUROLOGIC: Cranial nerves II through XII are intact. +decreased strength in right leg secondary to pain. Sensation intact. Gait not checked.  PSYCHIATRIC: The patient is alert and oriented x 3.  SKIN: No obvious rash, lesion, or ulcer.  DIET:  Cardiac diet with 1267m fluid restriction DISCHARGE CONDITION:  Stable ACTIVITY:  Weightbearing as tolerated to right leg OXYGEN:  Home Oxygen: No.  Oxygen Delivery: room air DISCHARGE LOCATION:  nursing home   If you experience worsening of your admission symptoms, develop shortness of breath, life threatening emergency, suicidal or homicidal thoughts you must seek medical attention immediately by calling 911 or calling your MD immediately  if symptoms less severe.  You Must read complete instructions/literature along with all the possible adverse reactions/side effects for all the Medicines you take and that have been prescribed to you. Take any new Medicines after you have completely understood and accpet all the possible adverse reactions/side effects.   Please note  You were cared for by a hospitalist during your hospital stay. If you have any questions  about your discharge medications or the care you received while you were in the hospital after you are discharged, you can call the unit and asked to speak with the hospitalist on call if the hospitalist that took care of you is not available. Once you are discharged, your primary care physician will handle any further medical issues. Please note that NO REFILLS for any discharge medications will be authorized once you are discharged, as it is  imperative that you return to your primary care physician (or establish a relationship with a primary care physician if you do not have one) for your aftercare needs so that they can reassess your need for medications and monitor your lab values.    On the day of Discharge:  VITAL SIGNS:  Blood pressure (!) 158/73, pulse 97, temperature 99.4 F (37.4 C), temperature source Oral, resp. rate 18, height _0  (1.727 m), weight 110.7 kg, SpO2 100 %. PHYSICAL EXAMINATION:  GENERAL:  68 y.o.-year-old patient lying in the bed with no acute distress.  EYES: Pupils equal, round, reactive to light and accommodation. No scleral icterus. Extraocular muscles intact.  HEENT: Head atraumatic, normocephalic. Oropharynx and nasopharynx clear.  NECK:  Supple, no jugular venous distention. No thyroid enlargement, no tenderness.  LUNGS: Normal breath sounds bilaterally, no wheezing, rales,rhonchi or crepitation. No use of accessory muscles of respiration.  CARDIOVASCULAR: S1, S2 normal. No murmurs, rubs, or gallops.  ABDOMEN: Soft, non-tender, non-distended. Bowel sounds present. No organomegaly or mass.  EXTREMITIES: No pedal edema, cyanosis, or clubbing.  NEUROLOGIC: Cranial nerves II through XII are intact. Muscle strength 5/5 in all extremities. Sensation intact. Gait not checked.  PSYCHIATRIC: The patient is alert and oriented x 3.  SKIN: No obvious rash, lesion, or ulcer.  DATA REVIEW:   CBC Recent Labs  Lab 10/08/18 0453  WBC 9.5  HGB 10.8*  HCT 33.5*  PLT 269      Chemistries  Recent Labs  Lab 10/08/18 0453  NA 130*  K 3.6  CL 96*  CO2 30  GLUCOSE 156*  BUN 9  CREATININE 0.55  CALCIUM 7.1*     Microbiology Results  Results for orders placed or performed during the hospital encounter of 10/02/18  Surgical pcr screen     Status: None   Collection Time: 10/03/18  1:48 AM  Result Value Ref Range Status   MRSA, PCR NEGATIVE NEGATIVE Final   Staphylococcus aureus NEGATIVE NEGATIVE Final    Comment: (NOTE) The Xpert SA Assay (FDA approved for NASAL specimens in patients 61 years of age and older), is one component of a comprehensive surveillance program. It is not intended to diagnose infection nor to guide or monitor treatment. Performed at [facility], [address], [place], [place] [address]   Urine Culture     Status: Abnormal   Collection Time: 10/05/18  7:49 PM  Result Value Ref Range Status   Specimen Description URINE, RANDOM  Final   Special Requests   Final    NONE Performed at [facility], [place], [place] [address]    Culture 60,000 COLONIES/mL ENTEROCOCCUS FAECALIS (A)  Final   Report Status 10/08/2018 FINAL  Final   Organism ID, Bacteria ENTEROCOCCUS FAECALIS (A)  Final      Susceptibility   Enterococcus faecalis - MIC*    AMPICILLIN <=2 SENSITIVE Sensitive     LEVOFLOXACIN 0.5 SENSITIVE Sensitive     NITROFURANTOIN <=16 SENSITIVE Sensitive     VANCOMYCIN 1 SENSITIVE Sensitive     * 60,000 COLONIES/mL ENTEROCOCCUS FAECALIS    RADIOLOGY:  No results found.   Management plans discussed with the patient, family and they are in agreement.  CODE STATUS: Prior   TOTAL TIME TAKING CARE OF THIS PATIENT: 35 minutes.    Berna Spare Mayo M.D on 10/09/2018 at 9:35 AM  Between 7am to 6pm - Pager - [phone]  After 6pm go to [email or web address] - [place]  [facility]  Office  [phone]  CC: Primary care physician; Tracie Harrier,  MD   Note: This dictation was prepared with Dragon dictation along with smaller phrase technology. Any transcriptional errors that result from this process are unintentional.

## 2018-10-08 NOTE — Progress Notes (Signed)
[facility]  ROUNDING NOTE   Subjective:   Na 130  Objective:  Vital signs in last 24 hours:  Temp:  [98.4 F (36.9 C)-99.4 F (37.4 C)] 99.4 F (37.4 C) (01/25 0759) Pulse Rate:  [77-97] 97 (01/25 0759) Resp:  [17-18] 18 (01/25 0759) BP: (121-158)/(61-83) 158/73 (01/25 0759) SpO2:  [91 %-100 %] 100 % (01/25 0759)  Weight change:  Filed Weights   10/02/18 2247  Weight: 110.7 kg    Intake/Output: I/O last 3 completed shifts: In: 3384.3 [P.O.:150; I.V.:3134.3; IV Piggyback:100] Out: 2505 [Urine:2255; Drains:100; Blood:150]   Intake/Output this shift:  Total I/O In: 278.1 [I.V.:278.1] Out: -   Physical Exam: General: NAD,   Head: Normocephalic, atraumatic. Moist oral mucosal membranes  Eyes: Anicteric, PERRL  Neck: Supple, trachea midline  Lungs:  Clear to auscultation  Heart: Regular rate and rhythm  Abdomen:  Soft, nontender,   Extremities: No peripheral edema.  Neurologic: Nonfocal, moving all four extremities  Skin: No lesions        Basic Metabolic Panel: Recent Labs  Lab 10/04/18 1308  10/05/18 0159 10/05/18 1014 10/06/18 0342 10/07/18 0440 10/08/18 0453  NA 122*   < > 127* 128* 131* 133* 130*  K 3.6  --  3.5  --  3.8 4.1 3.6  CL 85*  --  90*  --  93* 97* 96*  CO2 27  --  28  --  29 29 30   GLUCOSE 164*  --  153*  --  155* 172* 156*  BUN 6*  --  8  --  10 15 9   CREATININE 0.34*  --  0.50  --  0.53 0.50 0.55  CALCIUM 7.7*  --  7.7*  --  8.1* 7.2* 7.1*   < > = values in this interval not displayed.    Liver Function Tests: No results for input(s): AST, ALT, ALKPHOS, BILITOT, PROT, ALBUMIN in the last 168 hours. No results for input(s): LIPASE, AMYLASE in the last 168 hours. No results for input(s): AMMONIA in the last 168 hours.  CBC: Recent Labs  Lab 10/02/18 2252 10/03/18 0253 10/06/18 0342 10/07/18 0440 10/08/18 0453  WBC 7.5 11.5* 9.9 10.9* 9.5  NEUTROABS 4.4  --   --   --   --   HGB 12.8 12.7 12.7 12.1 10.8*  HCT  37.7 38.1 38.4 37.7 33.5*  MCV 90.6 92.9 93.4 96.4 94.4  PLT 276 275 279 270 269    Cardiac Enzymes: Recent Labs  Lab 10/02/18 2252  TROPONINI <0.03    BNP: Invalid input(s): POCBNP  CBG: Recent Labs  Lab 10/07/18 0820 10/07/18 1143 10/07/18 1653 10/07/18 2302 10/08/18 0801  GLUCAP 167* 165* 165* 173* 168*    Microbiology: Results for orders placed or performed during the hospital encounter of 10/02/18  Surgical pcr screen     Status: None   Collection Time: 10/03/18  1:48 AM  Result Value Ref Range Status   MRSA, PCR NEGATIVE NEGATIVE Final   Staphylococcus aureus NEGATIVE NEGATIVE Final    Comment: (NOTE) The Xpert SA Assay (FDA approved for NASAL specimens in patients 66 years of age and older), is one component of a comprehensive surveillance program. It is not intended to diagnose infection nor to guide or monitor treatment. Performed at [facility], [address] [place], [place] [address]   Urine Culture     Status: Abnormal   Collection Time: 10/05/18  7:49 PM  Result Value Ref Range Status   Specimen  Description URINE, RANDOM  Final   Special Requests   Final    NONE Performed at [facility], [address] [place], [place] [address]    Culture 60,000 COLONIES/mL ENTEROCOCCUS FAECALIS (A)  Final   Report Status 10/08/2018 FINAL  Final   Organism ID, Bacteria ENTEROCOCCUS FAECALIS (A)  Final      Susceptibility   Enterococcus faecalis - MIC*    AMPICILLIN <=2 SENSITIVE Sensitive     LEVOFLOXACIN 0.5 SENSITIVE Sensitive     NITROFURANTOIN <=16 SENSITIVE Sensitive     VANCOMYCIN 1 SENSITIVE Sensitive     * 60,000 COLONIES/mL ENTEROCOCCUS FAECALIS    Coagulation Studies: No results for input(s): LABPROT, INR in the last 72 hours.  Urinalysis: Recent Labs    10/05/18 1949  COLORURINE STRAW*  LABSPEC 1.003*  PHURINE 6.0  GLUCOSEU NEGATIVE  HGBUR MODERATE*  BILIRUBINUR NEGATIVE  KETONESUR 5*  PROTEINUR NEGATIVE   NITRITE NEGATIVE  LEUKOCYTESUR TRACE*      Imaging: Dg Hip Port Unilat With Pelvis 1v Right  Result Date: 10/06/2018 CLINICAL DATA:  Status post hemiarthroplasty. EXAM: DG HIP (WITH OR WITHOUT PELVIS) 1V PORT RIGHT COMPARISON:  10/02/2018 FINDINGS: New right bipolar hip arthroplasty without complicating features or postoperative fracture. Left-sided hip arthroplasty is also noted with shallow appearing acetabulum. No joint dislocation is identified. The pubic rami appear intact. No diastasis of the pubic symphysis. IMPRESSION: 1. New right bipolar hip arthroplasty without complicating features. 2. Shallow appearing left acetabulum with uncemented intact hip arthroplasty. No dislocation or complicating features. Electronically Signed   By: Tollie Eth M.D.   On: 10/06/2018 23:36     Medications:    . amLODipine  5 mg Oral Daily  . aspirin EC  81 mg Oral Daily  . atorvastatin  20 mg Oral QHS  . baclofen  5 mg Oral BID  . carvedilol  12.5 mg Oral BID WC  . clonazePAM  0.5 mg Oral BID  . diphenhydrAMINE  50-100 mg Oral QHS  . divalproex  1,500 mg Oral Daily  . DULoxetine  120 mg Oral Daily  . enoxaparin (LOVENOX) injection  40 mg Subcutaneous Q24H  . ferrous sulfate  650 mg Oral Q1400  . fluticasone  1 spray Each Nare BID  . gabapentin  1,200 mg Oral TID  . insulin aspart  0-5 Units Subcutaneous QHS  . insulin aspart  0-9 Units Subcutaneous TID WC  . latanoprost  1 drop Both Eyes QHS  . lisinopril  20 mg Oral Daily  . mouth rinse  15 mL Mouth Rinse BID  . nystatin   Topical BID  . OLANZapine  7.5 mg Oral QHS  . oxyCODONE  20 mg Oral Q12H  . polyethylene glycol  17 g Oral Daily  . senna-docusate  1 tablet Oral BID  . traZODone  100 mg Oral QHS  . vitamin B-12  5,000 mcg Oral Q1400   acetaminophen, albuterol, bisacodyl, hydrALAZINE, HYDROmorphone (DILAUDID) injection, magnesium hydroxide, Melatonin, menthol-cetylpyridinium **OR** phenol, metoCLOPramide **OR** metoCLOPramide  (REGLAN) injection, ondansetron **OR** ondansetron (ZOFRAN) IV, oxyCODONE, sodium phosphate  Assessment/ Plan:  Ms. GLORINE GERARDO is a 68 y.o. white female with hypertension, diabetes mellitus type II, bipolar disorder, coronary artery disease, COPD, gastric bypass, chronic pain on chronic narcotics , who was admitted to [facility] on 10/02/2018 for right hip fracture  1. Hyponatremia: acute on chronic hyponatremia. Patient has history of polydipsia.  - tolvaptan 15mg  PO x 2 days - Continue fluid restriction  2. Hypertension:  Carvedilol, amlodipine and irbesartan.  3. 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 On metformin as outpatient.  Hyperglycemia can cause dry mouth and polydipsia.  Hemoglobin A1c of 5.9%.    LOS: 5 Yovan Leeman 1/25/202011:04 AM

## 2018-10-08 NOTE — Progress Notes (Signed)
PT Cancellation Note  Patient Details Name: Amy Sweeney MRN: [id number] DOB: May 27, 1951   Cancelled Treatment:    Reason Eval/Treat Not Completed: Other (comment). Pt was already back to bed when PT arrived, and is imminently awaiting a transport to SNF.  In pain and not interested in therapy right now.  Will try later if pt is still here.   Amy Sweeney 10/08/2018, 2:09 PM   Amy Sweeney, PT MS Acute Rehab Dept. Number: [facility] [id number] and [facility] [phone]

## 2018-10-08 NOTE — Progress Notes (Signed)
Patient is being discharged to [facility] room 712. Report called to [facility]. Belongings packed, IV removed.

## 2018-10-08 NOTE — Progress Notes (Addendum)
Subjective: 2 Days Post-Op Procedure(s) (LRB): ARTHROPLASTY BIPOLAR HIP (HEMIARTHROPLASTY) (Right) Patient reports pain as moderate.   Patient was actually sleeping well in the room.  Nurse was able to remove the IV without the patient waking up.  She did not wake up until the Hemovac was removed from the right hip.  She did not voice any complaints of pain at that time like she had been.  Appears to be resting well.  Patient admits to sleeping well. Patient is well, and has had no acute complaints or problems Physical therapy progressing extremely slow. Plan is to go Rehab after hospital stay. no nausea and no vomiting Patient denies any chest pains or shortness of breath. Objective: Vital signs in last 24 hours: Temp:  [98.4 F (36.9 C)-99.4 F (37.4 C)] 99.4 F (37.4 C) (01/25 0759) Pulse Rate:  [77-98] 97 (01/25 0759) Resp:  [17-18] 18 (01/25 0759) BP: (121-158)/(61-84) 158/73 (01/25 0759) SpO2:  [91 %-100 %] 100 % (01/25 0759) well approximated incision Heels are non tender and elevated off the bed using rolled towels Intake/Output from previous day: 01/24 0701 - 01/25 0700 In: 1354.3 [P.O.:120; I.V.:1134.3; IV Piggyback:100] Out: 1610 [[id number]:[id number] [Urine:1645; Drains:60] Intake/Output this shift: No intake/output data recorded.  Recent Labs    10/06/18 0342 10/07/18 0440 10/08/18 0453  HGB 12.7 12.1 10.8*   Recent Labs    10/07/18 0440 10/08/18 0453  WBC 10.9* 9.5  RBC 3.91 3.55*  HCT 37.7 33.5*  PLT 270 269   Recent Labs    10/07/18 0440 10/08/18 0453  NA 133* 130*  K 4.1 3.6  CL 97* 96*  CO2 29 30  BUN 15 9  CREATININE 0.50 0.55  GLUCOSE 172* 156*  CALCIUM 7.2* 7.1*   No results for input(s): LABPT, INR in the last 72 hours.  EXAM General - Patient is Alert, Appropriate and Oriented Extremity - Neurologically intact Neurovascular intact Sensation intact distally Intact pulses distally Dorsiflexion/Plantar flexion intact No cellulitis  present Compartment soft Dressing - dressing C/D/I Motor Function - intact, moving foot and toes well on exam.    Past Medical History:  Diagnosis Date  . Anxiety   . Arthritis    degenerative artgritis  osteoarthritis  . Bipolar 1 disorder (HCC)   . Bipolar disorder (HCC)   . COPD (chronic obstructive pulmonary disease) (HCC)    hx pulmonary embolism  . Coronary artery disease   . DDD (degenerative disc disease), cervical   . DDD (degenerative disc disease), cervical   . Depression   . Diabetes mellitus without complication (HCC)    Pt takes metformin.   Marland Kitchen. Dyspnea   . Family history of adverse reaction to anesthesia    father blood clot after surgery died  . Headache    history of migraines  . History of pulmonary embolus (PE)   . History of shingles   . Hypertension   . Lower extremity deep venous thrombosis (HCC)   . Lower extremity edema   . 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 Night terrors   . OCD (obsessive compulsive disorder)   . Osteoarthritis   . Osteoporosis   . PTSD (post-traumatic stress disorder)   . Vitamin B 12 deficiency     Assessment/Plan: 2 Days Post-Op Procedure(s) (LRB): ARTHROPLASTY BIPOLAR HIP (HEMIARTHROPLASTY) (Right) Active Problems:   Closed right hip fracture (HCC)  Estimated body mass index is 37.1 kg/m as calculated from the following:  Height as of this encounter: 5\' 8"  (1.727 m).   Weight as of this encounter: 110.7 kg. Up with therapy Discharge to SNF probably Monday.  Provided she continues to be medically stable  Labs: Sodium slightly down from 133-130.  Hemoglobin slight drop to 10.8.  Patient appears to be asymptomatic DVT Prophylaxis - Lovenox, Foot Pumps and TED hose Weight-Bearing as tolerated to right leg Hemovac was discontinued.  Into the drain appeared to be intact. We will continue to monitor sodium and if stable will plan to  discharge either Sunday or Monday to rehab  Jon R. [facility] PA [facility] 10/08/2018, 8:07 AM agree with above

## 2018-10-08 NOTE — Clinical Social Work Note (Signed)
The patient will discharge today to [facility] via non-emergent EMS. The CSW has sent documentation to the facility and has delivered the discharge packet. The CSW is signing off. Please consult should needs arise.  Argentina Ponder, MSW, Theresia Majors [phone]

## 2018-10-08 NOTE — Plan of Care (Signed)
  Problem: Skin Integrity: Goal: Signs of wound healing will improve Outcome: Progressing   

## 2018-10-08 NOTE — Progress Notes (Signed)
Physical Therapy Treatment Patient Details Name: Amy Sweeney MRN: [id number] DOB: 11/24/1950 Today's Date: 10/08/2018    History of Present Illness Pt is a 68 y/o F s/p fall with resultant R femoral neck fx.  Pt now s/p R hip hemiarthroplasty.  Pt's PMH includes chronic pain an don chronic narcotics, bipolar disorder, COPD, DDD, PE, OCD, PTSD, cervical lemainoplasty, L THA, R TKA.      PT Comments    Pt in bed, saturated with urine.  Nurse tech in to assist with transition to care for bed change and bathing as appropriate.  To edge of bed with mod a x 2 and increased time.  Once sitting she is able to sit with 1 UE support and min guard.  She stands slowly with mod a x 2 and while never fully upright, she is able to slide her feet along the floor to transition to recliner.  She is unable to take any real steps.  Remained up after session to finish her breakfast.   Follow Up Recommendations  SNF     Equipment Recommendations       Recommendations for Other Services       Precautions / Restrictions Precautions Precautions: Fall;Posterior Hip Restrictions Weight Bearing Restrictions: Yes RLE Weight Bearing: Weight bearing as tolerated LLE Weight Bearing: Weight bearing as tolerated    Mobility  Bed Mobility Overal bed mobility: Needs Assistance Bed Mobility: Sit to Supine     Supine to sit: Mod assist;+2 for physical assistance        Transfers Overall transfer level: Needs assistance Equipment used: Rolling walker (2 wheeled) Transfers: Sit to/from Stand Sit to Stand: Mod assist;+2 physical assistance;From elevated surface            Ambulation/Gait Ambulation/Gait assistance: Mod assist;+2 physical assistance Gait Distance (Feet): 2 Feet Assistive device: Rolling walker (2 wheeled) Gait Pattern/deviations: Step-to pattern Gait velocity: decreased   General Gait Details: slides feet along floor to transition to recliner at bedside.   Stairs             Wheelchair Mobility    Modified Rankin (Stroke Patients Only)       Balance Overall balance assessment: Needs assistance;History of Falls Sitting-balance support: Single extremity supported;Feet supported Sitting balance-Leahy Scale: Fair Sitting balance - Comments: Pt relies on at least 1UE support sitting EOB   Standing balance support: Bilateral upper extremity supported;During functional activity Standing balance-Leahy Scale: Poor Standing balance comment: Pt relies on BUE support and outside physical assist for static and dynamic activity                            Cognition Arousal/Alertness: Awake/alert Behavior During Therapy: WFL for tasks assessed/performed Overall Cognitive Status: Within Functional Limits for tasks assessed                                        Exercises      General Comments        Pertinent Vitals/Pain Pain Assessment: Faces Faces Pain Scale: Hurts little more Pain Location: R hip, chronic back pain Pain Descriptors / Indicators: Aching;Grimacing;Guarding Pain Intervention(s): Limited activity within patient's tolerance;Premedicated before session    Home Living                      Prior Function  PT Goals (current goals can now be found in the care plan section) Progress towards PT goals: Progressing toward goals    Frequency    BID      PT Plan Current plan remains appropriate    Co-evaluation              AM-PAC PT "6 Clicks" Mobility   Outcome Measure  Help needed turning from your back to your side while in a flat bed without using bedrails?: A Lot Help needed moving from lying on your back to sitting on the side of a flat bed without using bedrails?: A Lot Help needed moving to and from a bed to a chair (including a wheelchair)?: A Lot Help needed standing up from a chair using your arms (e.g., wheelchair or bedside chair)?: A Lot Help needed to  walk in hospital room?: A Lot Help needed climbing 3-5 steps with a railing? : Total 6 Click Score: 11    End of Session Equipment Utilized During Treatment: Gait belt Activity Tolerance: Patient limited by fatigue;Patient tolerated treatment well Patient left: in chair;with call bell/phone within reach;with chair alarm set;with nursing/sitter in room   Pain - Right/Left: Right Pain - part of body: Hip     Time: 3428-7681 PT Time Calculation (min) (ACUTE ONLY): 20 min  Charges:  $Therapeutic Activity: 8-22 mins                     Danielle Dess, PTA 10/08/18, 10:52 AM

## 2018-10-08 NOTE — Progress Notes (Signed)
Occupational Therapy Treatment Patient Details Name: Amy Sweeney MRN: [id number] DOB: [birth year] Today's Date: 10/08/2018    History of present illness Pt is a 68 y/o F s/p fall with resultant R femoral neck fx.  Pt now s/p R hip hemiarthroplasty.  Pt's PMH includes chronic pain an don chronic narcotics, bipolar disorder, COPD, DDD, PE, OCD, PTSD, cervical lemainoplasty, L THA, R TKA.     OT comments  Pt seen for OT tx this date. She tolerated treatment moderately well. Initial plan was to address LB dressing with adaptive equipment. However, pt c/o discomfort from sitting in armchair x2.5 hours. CNA presents to inform OT that pt actually needs to get back to bed in prep for d/c to facility at this time, so OT assists to address pt's skills with fxl transfers. Pt educated on importance of OOB activity and verbalized understanding. She is able to name 2/3 THPs at start of session and OT re-addresses THPs with pt in prep for mobilization. For t/f from chair to bed, pt requires MOD A x2p with arm in arm and MOD verbal cues for safe hand/foot placement as well as how to assist with weight shifting and maintain THPs. Pt demos 75% understanding of cueing. Pt safely transfers to EOB and requires MOD A x2p for UB/LB mgt to perform sit to sup. Pt in bed needs to don brief in prep for transport. Pt requires MOD A to laterally roll to don brief and minimally participates d/t skin irritation in groin fold causing pain. She is able to name 3/3 THPs at end of session. Pt is positioned with HOB slightly elevated, call light in reach and bed alarm on with bedside table available to finish eating lunch. CNA present at end of session to help prepare pt's personal items. Pt with c/o 8/10 pain with mvmt.     SNF    Equipment Recommendations  Tub/shower seat;Other (comment)    Recommendations for Other Services      Precautions / Restrictions Precautions Precautions: Fall;Posterior Hip Precaution Booklet  Issued: Yes (comment) Precaution Comments: Pt able to recall 2/3 hip precautions, reviewed in full with pt.  Restrictions Weight Bearing Restrictions: Yes RLE Weight Bearing: Weight bearing as tolerated LLE Weight Bearing: Weight bearing as tolerated       Mobility Bed Mobility Overal bed mobility: Needs Assistance Bed Mobility: Rolling;Sit to Supine Rolling: Mod assist   Supine to sit: Mod assist;+2 for physical assistance Sit to supine: Max assist;+2 for physical assistance(to manage UB/LB)   General bed mobility comments: Pt yelling out due to pain with R lateral roll  Transfers Overall transfer level: Needs assistance Equipment used: (arm in arm with gait belt) Transfers: Sit to/from [employer] Transfers Sit to Stand: Mod assist;+2 physical assistance Stand pivot transfers: Mod assist;+2 physical assistance       General transfer comment: Pt required MOD verbal and tactile cues for hand and foot placement, to scoot out in chair until both feet touched the floor, to reach back for bed to control sit.     Balance Overall balance assessment: Needs assistance;History of Falls Sitting-balance support: Single extremity supported;Feet supported Sitting balance-Leahy Scale: Fair Sitting balance - Comments: Pt relies on at least 1UE support sitting EOB   Standing balance support: Bilateral upper extremity supported;During functional activity Standing balance-Leahy Scale: Poor Standing balance comment: Pt requires physical assist of two people and b/l UE support for static and dynamic standing balance  ADL either performed or assessed with clinical judgement   ADL                       Lower Body Dressing: Maximal assistance;Set up;Bed level Lower Body Dressing Details (indicate cue type and reason): to don brief in bed using rolling technique.                      Vision Patient Visual Report: No change from  baseline     Perception     Praxis      Cognition Arousal/Alertness: Awake/alert Behavior During Therapy: WFL for tasks assessed/performed Overall Cognitive Status: Within Functional Limits for tasks assessed                                          Exercises Other Exercises Other Exercises: Pt educated on falls prevention strategies and requires reinforcement Other Exercises: Pt educated on importance of performing as much self care as independently as possible including learning use of AE which she declines to participate in during this session d/t being tired of sitting up in chair when OT presents for tx.  Other Exercises: OT re-addressed THPs to ensure pt was able to name 3/3 at end of session.    Shoulder Instructions       General Comments      Pertinent Vitals/ Pain       Pain Assessment: 0-10 Pain Score: 8 (8 with mvmt, 3-4 at rest) Faces Pain Scale: Hurts little more Pain Location: R hip, chronic back pain Pain Descriptors / Indicators: Aching;Moaning;Grimacing Pain Intervention(s): Limited activity within patient's tolerance;Monitored during session;Repositioned  Home Living                                          Prior Functioning/Environment              Frequency  Min 2X/week        Progress Toward Goals  OT Goals(current goals can now be found in the care plan section)  Progress towards OT goals: Progressing toward goals  Acute Rehab OT Goals Patient Stated Goal: decreased pain OT Goal Formulation: With patient Time For Goal Achievement: 10/21/18 Potential to Achieve Goals: Fair  Plan Discharge plan remains appropriate    Co-evaluation                 AM-PAC OT "6 Clicks" Daily Activity     Outcome Measure   Help from another person eating meals?: None Help from another person taking care of personal grooming?: None Help from another person toileting, which includes using toliet, bedpan, or  urinal?: A Lot Help from another person bathing (including washing, rinsing, drying)?: A Lot Help from another person to put on and taking off regular upper body clothing?: A Little Help from another person to put on and taking off regular lower body clothing?: A Lot 6 Click Score: 17    End of Session Equipment Utilized During Treatment: Gait belt  OT Visit Diagnosis: Unsteadiness on feet (R26.81);Pain;Muscle weakness (generalized) (M62.81) Pain - Right/Left: Right Pain - part of body: Hip   Activity Tolerance Patient limited by pain   Patient Left in bed;with bed alarm set;with nursing/sitter in room   Nurse Communication  Time: [id number] OT Time Calculation (min): 34 min  Charges: OT General Charges $OT Visit: 1 Visit OT Treatments $Self Care/Home Management : 8-22 mins $Therapeutic Activity: 8-22 mins  Rejeana Brocklison Phinneas Shakoor, MS, OTR/L ascom [phone] or [phone] 10/08/18, 2:32 PM

## 2018-10-09 ENCOUNTER — Other Ambulatory Visit: Payer: Self-pay | Admitting: Internal Medicine

## 2018-10-09 MED ORDER — CLONAZEPAM 0.5 MG PO TABS: 0.5000 mg | ORAL_TABLET | Freq: Two times a day (BID) | ORAL | 0 refills | Status: DC

## 2018-10-11 LAB — SURGICAL PATHOLOGY

## 2018-10-21 NOTE — Anesthesia Postprocedure Evaluation (Signed)
Anesthesia Post Note  Patient: Amy Sweeney  Procedure(s) Performed: ARTHROPLASTY BIPOLAR HIP (HEMIARTHROPLASTY) (Right )  Patient location during evaluation: PACU Anesthesia Type: Spinal Level of consciousness: awake and alert Pain management: pain level controlled Vital Signs Assessment: post-procedure vital signs reviewed and stable Respiratory status: spontaneous breathing, nonlabored ventilation, respiratory function stable and patient connected to nasal cannula oxygen Cardiovascular status: blood pressure returned to baseline and stable Postop Assessment: no apparent nausea or vomiting Anesthetic complications: no     Last Vitals:  Vitals:   10/08/18 0145 10/08/18 0759  BP: 121/61 (!) 158/73  Pulse: 77 97  Resp:  18  Temp: 37.1 C 37.4 C  SpO2: 96% 100%    Last Pain:  Vitals:   10/08/18 1238  TempSrc:   PainSc: 9                  Yevette Edwards

## 2019-06-29 ENCOUNTER — Ambulatory Visit
Admission: RE | Admit: 2019-06-29 | Discharge: 2019-06-29 | Disposition: A | Discharge status: Home / Self Care | Payer: Medicare Other | Source: Ambulatory Visit | Attending: Family Medicine | Admitting: Family Medicine

## 2019-06-29 ENCOUNTER — Other Ambulatory Visit: Payer: Self-pay | Admitting: Family Medicine

## 2019-06-29 ENCOUNTER — Other Ambulatory Visit: Payer: Self-pay

## 2019-06-29 DIAGNOSIS — M5090 Cervical disc disorder, unspecified, unspecified cervical region: Secondary | ICD-10-CM

## 2019-07-19 ENCOUNTER — Other Ambulatory Visit: Payer: Self-pay | Admitting: Family Medicine

## 2019-11-29 ENCOUNTER — Other Ambulatory Visit: Payer: Self-pay | Admitting: Internal Medicine

## 2019-11-29 DIAGNOSIS — Z1231 Encounter for screening mammogram for malignant neoplasm of breast: Secondary | ICD-10-CM

## 2020-02-09 ENCOUNTER — Encounter: Payer: Self-pay | Admitting: Ophthalmology

## 2020-02-20 ENCOUNTER — Other Ambulatory Visit: Payer: Self-pay

## 2020-02-20 ENCOUNTER — Other Ambulatory Visit
Admission: RE | Admit: 2020-02-20 | Discharge: 2020-02-20 | Disposition: A | Discharge status: Home / Self Care | Payer: Medicare Other | Source: Ambulatory Visit | Attending: Ophthalmology | Admitting: Ophthalmology

## 2020-02-20 DIAGNOSIS — Z01812 Encounter for preprocedural laboratory examination: Secondary | ICD-10-CM | POA: Diagnosis present

## 2020-02-20 DIAGNOSIS — Z20822 Contact with and (suspected) exposure to covid-19: Secondary | ICD-10-CM | POA: Insufficient documentation

## 2020-02-21 LAB — SARS CORONAVIRUS 2 (TAT 6-24 HRS): SARS Coronavirus 2: NEGATIVE

## 2020-02-21 NOTE — Discharge Instructions (Addendum)
Eye Surgery Discharge Instructions  Expect mild scratchy sensation or mild soreness. DO NOT RUB YOUR EYE!  The day of surgery: . Minimal physical activity, but bed rest is not required . No reading, computer work, or close hand work . No bending, lifting, or straining. . May watch TV  For 24 hours: . No driving, legal decisions, or alcoholic beverages . Safety precautions . Eat anything you prefer: It is better to start with liquids, then soup then solid foods. . _____ Eye patch should be worn until postoperative exam tomorrow. . ____ Solar shield eyeglasses should be worn for comfort in the sunlight/patch while sleeping  Resume all regular medications including aspirin or Coumadin if these were discontinued prior to surgery. You may shower, bathe, shave, or wash your hair. Tylenol may be taken for mild discomfort. Follow Dr. Gerome Sam' eye drop instruction sheet as reviewed. Call your doctor if you experience significant pain, nausea, or vomiting, fever > 101 or other signs of infection. [phone] or [phone] Specific instructions:  Follow-up Information    Galen Manila, MD Follow up.   Specialty: Ophthalmology Why: 02/23/20 @ 1:50 pm. Contact information: [address] [place] [address] [phone]

## 2020-02-22 ENCOUNTER — Other Ambulatory Visit: Payer: Self-pay

## 2020-02-22 ENCOUNTER — Ambulatory Visit: Payer: Medicare Other | Admitting: Anesthesiology

## 2020-02-22 ENCOUNTER — Encounter
Admission: RE | Disposition: A | Discharge status: Home / Self Care | Payer: Self-pay | Source: Home / Self Care | Attending: Ophthalmology

## 2020-02-22 ENCOUNTER — Encounter: Payer: Self-pay | Admitting: Ophthalmology

## 2020-02-22 ENCOUNTER — Ambulatory Visit
Admission: RE | Admit: 2020-02-22 | Discharge: 2020-02-22 | Disposition: A | Discharge status: Home / Self Care | Payer: Medicare Other | Attending: Ophthalmology | Admitting: Ophthalmology

## 2020-02-22 DIAGNOSIS — Z87892 Personal history of anaphylaxis: Secondary | ICD-10-CM | POA: Diagnosis not present

## 2020-02-22 DIAGNOSIS — Z96653 Presence of artificial knee joint, bilateral: Secondary | ICD-10-CM | POA: Insufficient documentation

## 2020-02-22 DIAGNOSIS — Z7984 Long term (current) use of oral hypoglycemic drugs: Secondary | ICD-10-CM | POA: Insufficient documentation

## 2020-02-22 DIAGNOSIS — M1990 Unspecified osteoarthritis, unspecified site: Secondary | ICD-10-CM | POA: Insufficient documentation

## 2020-02-22 DIAGNOSIS — F319 Bipolar disorder, unspecified: Secondary | ICD-10-CM | POA: Diagnosis not present

## 2020-02-22 DIAGNOSIS — J449 Chronic obstructive pulmonary disease, unspecified: Secondary | ICD-10-CM | POA: Insufficient documentation

## 2020-02-22 DIAGNOSIS — H2511 Age-related nuclear cataract, right eye: Secondary | ICD-10-CM | POA: Insufficient documentation

## 2020-02-22 DIAGNOSIS — G43909 Migraine, unspecified, not intractable, without status migrainosus: Secondary | ICD-10-CM | POA: Insufficient documentation

## 2020-02-22 DIAGNOSIS — Z955 Presence of coronary angioplasty implant and graft: Secondary | ICD-10-CM | POA: Insufficient documentation

## 2020-02-22 DIAGNOSIS — E1136 Type 2 diabetes mellitus with diabetic cataract: Secondary | ICD-10-CM | POA: Insufficient documentation

## 2020-02-22 DIAGNOSIS — Z96643 Presence of artificial hip joint, bilateral: Secondary | ICD-10-CM | POA: Insufficient documentation

## 2020-02-22 DIAGNOSIS — I2510 Atherosclerotic heart disease of native coronary artery without angina pectoris: Secondary | ICD-10-CM | POA: Diagnosis not present

## 2020-02-22 DIAGNOSIS — Z86711 Personal history of pulmonary embolism: Secondary | ICD-10-CM | POA: Diagnosis not present

## 2020-02-22 DIAGNOSIS — Z885 Allergy status to narcotic agent status: Secondary | ICD-10-CM | POA: Diagnosis not present

## 2020-02-22 DIAGNOSIS — Z7982 Long term (current) use of aspirin: Secondary | ICD-10-CM | POA: Diagnosis not present

## 2020-02-22 DIAGNOSIS — Z79899 Other long term (current) drug therapy: Secondary | ICD-10-CM | POA: Diagnosis not present

## 2020-02-22 DIAGNOSIS — I10 Essential (primary) hypertension: Secondary | ICD-10-CM | POA: Diagnosis not present

## 2020-02-22 HISTORY — DX: Pneumonia, unspecified organism: J18.9

## 2020-02-22 HISTORY — DX: Dizziness and giddiness: R42

## 2020-02-22 HISTORY — DX: Personal history of other medical treatment: Z92.89

## 2020-02-22 HISTORY — DX: Diverticulitis of intestine, part unspecified, without perforation or abscess without bleeding: K57.92

## 2020-02-22 HISTORY — DX: Diverticulosis of intestine, part unspecified, without perforation or abscess without bleeding: K57.90

## 2020-02-22 HISTORY — PX: CATARACT EXTRACTION W/PHACO: SHX586

## 2020-02-22 LAB — GLUCOSE, CAPILLARY
Glucose-Capillary: 111 mg/dL — ABNORMAL HIGH (ref 70–99)
Glucose-Capillary: 96 mg/dL (ref 70–99)

## 2020-02-22 SURGERY — PHACOEMULSIFICATION, CATARACT, WITH IOL INSERTION
Anesthesia: Monitor Anesthesia Care | Site: Eye | Laterality: Right

## 2020-02-22 MED ORDER — CARBACHOL 0.01 % IO SOLN
INTRAOCULAR | Status: DC | PRN
  Administered 2020-02-22: 0.5 mL via INTRAOCULAR

## 2020-02-22 MED ORDER — ARMC OPHTHALMIC DILATING DROPS
OPHTHALMIC | Status: AC
  Filled 2020-02-22: qty 0.5

## 2020-02-22 MED ORDER — LIDOCAINE HCL (PF) 4 % IJ SOLN
INTRAOCULAR | Status: DC | PRN
  Administered 2020-02-22: 2 mL via OPHTHALMIC

## 2020-02-22 MED ORDER — MOXIFLOXACIN HCL 0.5 % OP SOLN
OPHTHALMIC | Status: DC | PRN
  Administered 2020-02-22: 0.2 mL via OPHTHALMIC

## 2020-02-22 MED ORDER — MIDAZOLAM HCL 2 MG/2ML IJ SOLN
INTRAMUSCULAR | Status: AC
  Filled 2020-02-22: qty 2

## 2020-02-22 MED ORDER — NA CHONDROIT SULF-NA HYALURON 40-17 MG/ML IO SOLN
INTRAOCULAR | Status: DC | PRN
  Administered 2020-02-22: 1 mL via INTRAOCULAR

## 2020-02-22 MED ORDER — EPINEPHRINE PF 1 MG/ML IJ SOLN
INTRAOCULAR | Status: DC | PRN
  Administered 2020-02-22: 200 mL via OPHTHALMIC

## 2020-02-22 MED ORDER — FENTANYL CITRATE (PF) 100 MCG/2ML IJ SOLN
INTRAMUSCULAR | Status: AC
  Filled 2020-02-22: qty 2

## 2020-02-22 MED ORDER — POVIDONE-IODINE 5 % OP SOLN
OPHTHALMIC | Status: DC | PRN
  Administered 2020-02-22: 1 via OPHTHALMIC

## 2020-02-22 MED ORDER — SODIUM CHLORIDE 0.9 % IV SOLN: INTRAVENOUS | Status: DC

## 2020-02-22 MED ORDER — EPHEDRINE SULFATE 50 MG/ML IJ SOLN
INTRAMUSCULAR | Status: DC | PRN
  Administered 2020-02-22: 10 mg via INTRAVENOUS
  Administered 2020-02-22: 5 mg via INTRAVENOUS
  Administered 2020-02-22: 10 mg via INTRAVENOUS

## 2020-02-22 MED ORDER — MIDAZOLAM HCL 2 MG/2ML IJ SOLN
INTRAMUSCULAR | Status: DC | PRN
  Administered 2020-02-22: 1 mg via INTRAVENOUS
  Administered 2020-02-22 (×2): .5 mg via INTRAVENOUS

## 2020-02-22 MED ORDER — TETRACAINE HCL 0.5 % OP SOLN
OPHTHALMIC | Status: AC
  Filled 2020-02-22: qty 4

## 2020-02-22 MED ORDER — ARMC OPHTHALMIC DILATING DROPS
1.0000 "application " | OPHTHALMIC | Status: AC
  Administered 2020-02-22 (×3): 1 via OPHTHALMIC

## 2020-02-22 MED ORDER — MOXIFLOXACIN HCL 0.5 % OP SOLN
OPHTHALMIC | Status: AC
  Filled 2020-02-22: qty 3

## 2020-02-22 MED ORDER — TETRACAINE HCL 0.5 % OP SOLN
1.0000 [drp] | Freq: Once | OPHTHALMIC | Status: AC
  Administered 2020-02-22: 1 [drp] via OPHTHALMIC

## 2020-02-22 MED ORDER — ONDANSETRON HCL 4 MG/2ML IJ SOLN
INTRAMUSCULAR | Status: DC | PRN
  Administered 2020-02-22: 4 mg via INTRAVENOUS

## 2020-02-22 MED ORDER — MOXIFLOXACIN HCL 0.5 % OP SOLN: 1.0000 [drp] | Freq: Once | OPHTHALMIC | Status: DC

## 2020-02-22 SURGICAL SUPPLY — 17 items
GLOVE BIO SURGEON STRL SZ8 (GLOVE) ×2 IMPLANT
GLOVE BIOGEL M 6.5 STRL (GLOVE) ×2 IMPLANT
GLOVE SURG LX 8.0 MICRO (GLOVE) ×1
GLOVE SURG LX STRL 8.0 MICRO (GLOVE) ×1 IMPLANT
GOWN STRL REUS W/ TWL LRG LVL3 (GOWN DISPOSABLE) ×2 IMPLANT
GOWN STRL REUS W/TWL LRG LVL3 (GOWN DISPOSABLE) ×2
KIT SLEEVE INFUSION .9 MICRO (MISCELLANEOUS) ×2 IMPLANT
LABEL CATARACT MEDS ST (LABEL) ×2 IMPLANT
LENS IOL DIOP 22.0 (Intraocular Lens) ×2 IMPLANT
LENS IOL TECNIS MONO 22.0 (Intraocular Lens) ×1 IMPLANT
PACK CATARACT BRASINGTON LX (MISCELLANEOUS) ×2 IMPLANT
PACK EYE AFTER SURG (MISCELLANEOUS) ×2 IMPLANT
SOL BSS BAG (MISCELLANEOUS) ×2
SOLUTION BSS BAG (MISCELLANEOUS) ×1 IMPLANT
SYR 5ML LL (SYRINGE) ×2 IMPLANT
WATER STERILE IRR 250ML POUR (IV SOLUTION) ×2 IMPLANT
WIPE NON LINTING 3.25X3.25 (MISCELLANEOUS) ×2 IMPLANT

## 2020-02-22 NOTE — Progress Notes (Signed)
Spoke with doctor about her allergies and the eye drops. Per Doctor he is ok with giving eye drops and her allergies

## 2020-02-22 NOTE — Anesthesia Postprocedure Evaluation (Signed)
Anesthesia Post Note  Patient: Amy Sweeney  Procedure(s) Performed: CATARACT EXTRACTION PHACO AND INTRAOCULAR LENS PLACEMENT (IOC) RIGHT DIABETIC (Right Eye)  Patient location during evaluation: PACU Anesthesia Type: MAC Level of consciousness: awake and alert Pain management: pain level controlled Vital Signs Assessment: post-procedure vital signs reviewed and stable Respiratory status: spontaneous breathing, nonlabored ventilation and respiratory function stable Cardiovascular status: blood pressure returned to baseline and stable Postop Assessment: no apparent nausea or vomiting Anesthetic complications: no   No complications documented.   Last Vitals:  Vitals:   02/22/20 0930 02/22/20 0932  BP: 102/60 102/60  Pulse:  72  Resp:  15  Temp:    SpO2:  99%    Last Pain:  Vitals:   02/22/20 0932  TempSrc:   PainSc: 0-No pain                 Amy Sweeney

## 2020-02-22 NOTE — H&P (Signed)
All labs reviewed. Abnormal studies sent to patients PCP when indicated.  Previous H&P reviewed, patient examined, there are NO CHANGES.  Amy Mcmann Porfilio6/10/20218:27 AM

## 2020-02-22 NOTE — Transfer of Care (Signed)
Immediate Anesthesia Transfer of Care Note  Patient: Amy Sweeney  Procedure(s) Performed: CATARACT EXTRACTION PHACO AND INTRAOCULAR LENS PLACEMENT (IOC) RIGHT DIABETIC (Right Eye)  Patient Location: PACU  Anesthesia Type:MAC  Level of Consciousness: sedated  Airway & Oxygen Therapy: Patient Spontanous Breathing and Patient connected to nasal cannula oxygen  Post-op Assessment: Report given to RN and Post -op Vital signs reviewed and stable  Post vital signs: Reviewed and stable  Last Vitals:  Vitals Value Taken Time  BP 89/56 02/22/20 0900  Temp    Pulse 68 02/22/20 0900  Resp 9 02/22/20 0900  SpO2 97 % 02/22/20 0900    Last Pain:  Vitals:   02/22/20 0711  TempSrc: Tympanic  PainSc: 0-No pain         Complications: No complications documented.

## 2020-02-22 NOTE — Anesthesia Preprocedure Evaluation (Signed)
Anesthesia Evaluation  Patient identified by MRN, date of birth, ID band Patient awake    Reviewed: Allergy & Precautions, H&P , NPO status , Patient's Chart, lab work & pertinent test results  History of Anesthesia Complications (+) Family history of anesthesia reaction and history of anesthetic complications  Airway Mallampati: III  TM Distance: >3 FB Neck ROM: limited    Dental  (+) Upper Dentures, Lower Dentures   Pulmonary pneumonia, COPD,    Pulmonary exam normal        Cardiovascular hypertension, + CAD  Normal cardiovascular exam     Neuro/Psych  Headaches,  Neuromuscular disease negative psych ROS   GI/Hepatic negative GI ROS, Neg liver ROS, neg GERD  ,  Endo/Other  diabetes, Type 2  Renal/GU      Musculoskeletal  (+) Arthritis ,   Abdominal   Peds  Hematology negative hematology ROS (+)   Anesthesia Other Findings Past Medical History: No date: Anxiety No date: Arthritis     Comment:  degenerative artgritis  osteoarthritis No date: Bipolar 1 disorder (HCC) No date: Bipolar disorder ([place]) No date: COPD (chronic obstructive pulmonary disease) (HCC)     Comment:  hx pulmonary embolism No date: Coronary artery disease No date: DDD (degenerative disc disease), cervical No date: DDD (degenerative disc disease), cervical No date: Depression No date: Diabetes mellitus without complication ([place])     Comment:  Pt takes metformin.  No date: Diverticulitis No date: Diverticulosis No date: Dyspnea No date: Family history of adverse reaction to anesthesia     Comment:  father blood clot after surgery died No date: Headache     Comment:  history of migraines No date: History of blood transfusion 2016: History of pulmonary embolus (PE) No date: History of shingles     Comment:  40 years ago No date: Hypertension No date: Lower extremity deep venous thrombosis (HCC) No date: Lower extremity edema No  date: Multiple closed 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, subsequent encounter No date: Night terrors No date: OCD (obsessive compulsive disorder) No date: Osteoarthritis No date: Osteoporosis No date: Pneumonia     Comment:  bronchitis, actually No date: PTSD (post-traumatic stress disorder) No date: Vertigo No date: Vitamin B 12 deficiency  Past Surgical History: 2004: CARDIAC CATHETERIZATION     Comment:  coronary stent x 1 No date: CERVICAL LAMINOPLASTY     Comment:  cervical vertebrae replacements No date: CESAREAN SECTION     Comment:  x2 No date: CHOLECYSTECTOMY 04/01/2018: COLONOSCOPY WITH PROPOFOL; N/A     Comment:  Procedure: COLONOSCOPY WITH PROPOFOL;  Surgeon:               Lollie Sails, MD;  Location: [facility];                Service: Endoscopy;  Laterality: N/A; No date: DILATION AND CURETTAGE OF UTERUS 04/01/2018: ESOPHAGOGASTRODUODENOSCOPY (EGD) WITH PROPOFOL; N/A     Comment:  Procedure: ESOPHAGOGASTRODUODENOSCOPY (EGD) WITH               PROPOFOL;  Surgeon: Lollie Sails, MD;  Location:               [facility];  Service: Endoscopy;  Laterality: N/A; No date: FRACTURE SURGERY 2004: GASTRIC BYPASS 10/04/2018: HIP ARTHROPLASTY; Right     Comment:  Procedure: ARTHROPLASTY BIPOLAR HIP (HEMIARTHROPLASTY);               Surgeon: Dereck Leep,  MD;  Location: [facility];                Service: Orthopedics;  Laterality: Right; 10/06/2018: HIP ARTHROPLASTY; Right     Comment:  Procedure: ARTHROPLASTY BIPOLAR HIP (HEMIARTHROPLASTY);               Surgeon: Dereck Leep, MD;  Location: [facility];                Service: Orthopedics;  Laterality: Right; No date: IVC FILTER PLACEMENT ([place] HX) 07/30/2016: JOINT REPLACEMENT; Left     Comment:  hip 2015: JOINT REPLACEMENT; Right     Comment:  knee No date: JOINT REPLACEMENT; Right     Comment:  hip 2004 & 2006: KNEE ARTHROSCOPY; Left     Comment:  x  2 2014: LAMINECTOMY     Comment:  lumbar 3-6 sacral 1-3  fusion No date: NECK SURGERY     Comment:   times 2 05/21/2015: PERIPHERAL VASCULAR CATHETERIZATION; N/A     Comment:  Procedure: IVC Filter Insertion;  Surgeon: Katha Cabal, MD;  Location: [facility];  Service:               Cardiovascular;  Laterality: N/A; 07/23/2015: PERIPHERAL VASCULAR CATHETERIZATION; N/A     Comment:  Procedure: IVC Filter Removal;  Surgeon: Katha Cabal, MD;  Location: [facility];  Service:               Cardiovascular;  Laterality: N/A; 07/21/2016: PERIPHERAL VASCULAR CATHETERIZATION; N/A     Comment:  Procedure: IVC Filter Insertion;  Surgeon: Katha Cabal, MD;  Location: [facility];  Service:               Cardiovascular;  Laterality: N/A; 10/16/2016: PERIPHERAL VASCULAR CATHETERIZATION; N/A     Comment:  Procedure: IVC Filter Removal;  Surgeon: Katha Cabal, MD;  Location: [facility];  Service:               Cardiovascular;  Laterality: N/A; 07/26/2017  09/01/2017: TOOTH EXTRACTION 07/30/2016: TOTAL HIP ARTHROPLASTY; Left     Comment:  Procedure: TOTAL HIP ARTHROPLASTY ANTERIOR APPROACH;                Surgeon: Hessie Knows, MD;  Location: [facility];  Service:              Orthopedics;  Laterality: Left; 05/30/2015: TOTAL KNEE ARTHROPLASTY; Right     Comment:  Procedure: TOTAL KNEE ARTHROPLASTY;  Surgeon: Hessie Knows, MD;  Location: [facility];  Service: Orthopedics;                Laterality: Right;  BMI    Body Mass Index: 35.73 kg/m      Reproductive/Obstetrics negative OB ROS                             Anesthesia Physical Anesthesia Plan  ASA: III  Anesthesia Plan: MAC   Post-op Pain Management:    Induction: Intravenous  PONV Risk Score and Plan:   Airway Management Planned: Natural Airway and Nasal Cannula  Additional Equipment:    Intra-op Plan:   Post-operative Plan:   Informed Consent: I have reviewed the patients History and Physical, chart, labs and discussed the procedure including the risks, benefits and alternatives for the proposed anesthesia with the patient or authorized representative who has indicated his/her understanding and acceptance.     Dental Advisory Given  Plan Discussed with: Anesthesiologist, CRNA and Surgeon  Anesthesia Plan Comments: (Patient consented for risks of anesthesia including but not limited to:  - adverse reactions to medications - damage to eyes, teeth, lips or other oral mucosa - nerve damage due to positioning  - sore throat or hoarseness - Damage to heart, brain, nerves, lungs, other parts of body or loss of life  Patient voiced understanding.)        Anesthesia Quick Evaluation

## 2020-02-22 NOTE — Op Note (Signed)
PREOPERATIVE DIAGNOSIS:  Nuclear sclerotic cataract of the right eye.   POSTOPERATIVE DIAGNOSIS:  H25.11 Cataract   OPERATIVE PROCEDURE: Procedure(s): CATARACT EXTRACTION PHACO AND INTRAOCULAR LENS PLACEMENT (IOC) RIGHT DIABETIC   SURGEON:  Galen Manila, MD.   ANESTHESIA:  Anesthesiologist: Piscitello, Cleda Mccreedy, MD CRNA: Mohammed Kindle, CRNA  1.      Managed anesthesia care. 2.      0.16ml of Shugarcaine was instilled in the eye following the paracentesis.   COMPLICATIONS:  None.   TECHNIQUE:   Stop and chop   DESCRIPTION OF PROCEDURE:  The patient was examined and consented in the preoperative holding area where the aforementioned topical anesthesia was applied to the right eye and then brought back to the Operating Room where the right eye was prepped and draped in the usual sterile ophthalmic fashion and a lid speculum was placed. A paracentesis was created with the side port blade and the anterior chamber was filled with viscoelastic. A near clear corneal incision was performed with the steel keratome. A continuous curvilinear capsulorrhexis was performed with a cystotome followed by the capsulorrhexis forceps. Hydrodissection and hydrodelineation were carried out with BSS on a blunt cannula. The lens was removed in a stop and chop  technique and the remaining cortical material was removed with the irrigation-aspiration handpiece. The capsular bag was inflated with viscoelastic and the Technis [id number]  lens was placed in the capsular bag without complication. The remaining viscoelastic was removed from the eye with the irrigation-aspiration handpiece. The wounds were hydrated. The anterior chamber was flushed with Miostat and the eye was inflated to physiologic pressure. 0.51ml of Vigamox was placed in the anterior chamber. The wounds were found to be water tight. The eye was dressed with Vigamox. The patient was given protective glasses to wear throughout the day and a shield  with which to sleep tonight. The patient was also given drops with which to begin a drop regimen today and will follow-up with me in one day. Implant Name Type Inv. Item Serial No. Manufacturer Lot No. LRB No. Used Action  LENS IOL DIOP 22.0 - [id number] Intraocular Lens LENS IOL DIOP 22.0 [id number]  Right 1 Implanted   Procedure(s) with comments: CATARACT EXTRACTION PHACO AND INTRAOCULAR LENS PLACEMENT (IOC) RIGHT DIABETIC (Right) - US00.37.4 CDE5.68 [id number]  Electronically signed: Galen Manila 02/22/2020 8:57 AM

## 2020-02-23 ENCOUNTER — Encounter: Payer: Self-pay | Admitting: Ophthalmology

## 2020-03-14 IMAGING — CR DG CHEST 1V
2 series · 2 of 2 positions shown · non-contrast
Comparison: None.

CLINICAL DATA: c/o mechanical fall tonight over a rug and fell onto
right side and on the ground approx 45 minutes, pain from right knee
to right hip, denies hitting head or LOCpre-op

EXAM:
CHEST  1 VIEW

[chest pa]
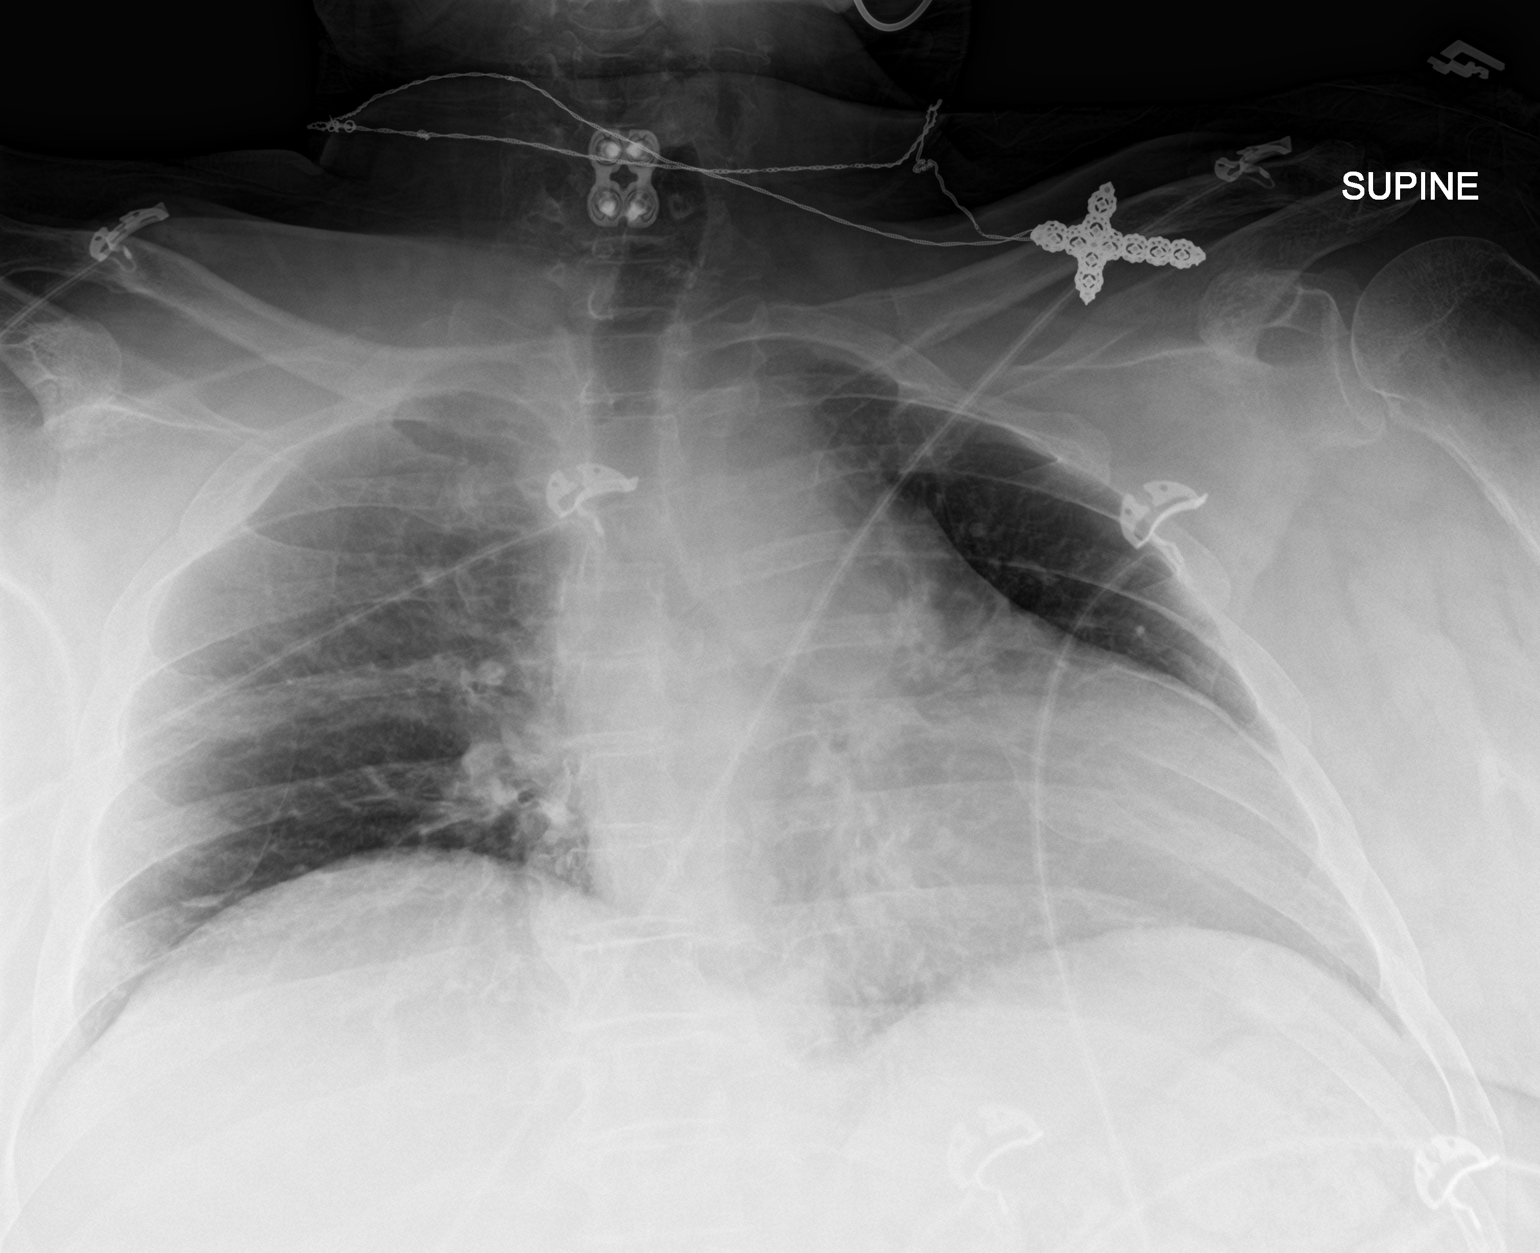

[chest ap]
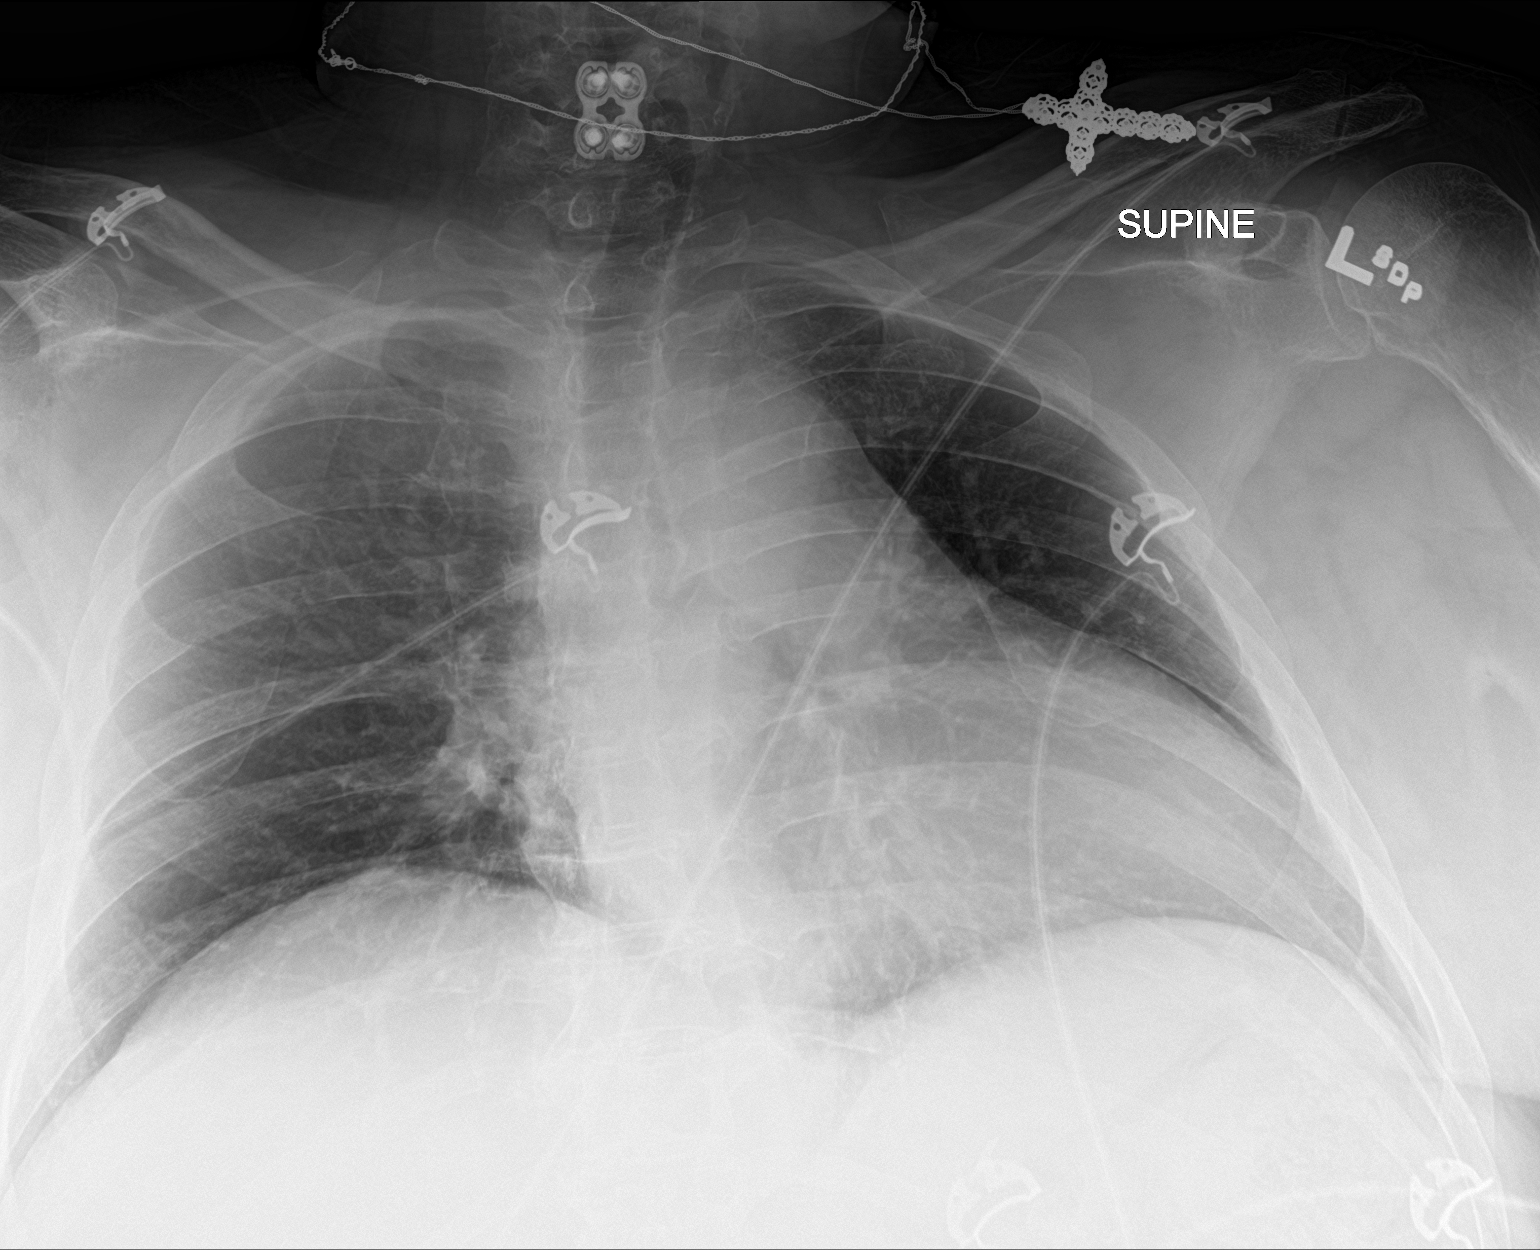

[2 of 2 positions shown; findings below may reference images not displayed]

FINDINGS: Normal cardiac silhouette. Low lung volumes. No effusion, infiltrate
or pneumothorax.
IMPRESSION: Low lung volumes.  No acute findings.

## 2020-03-15 IMAGING — DX DG ELBOW 2V*R*
2 series · 2 of 2 positions shown · non-contrast
Comparison: None.

CLINICAL DATA: 67-year-old female with fall and right elbow pain.

EXAM:
RIGHT ELBOW - 2 VIEW

[elbow ap]
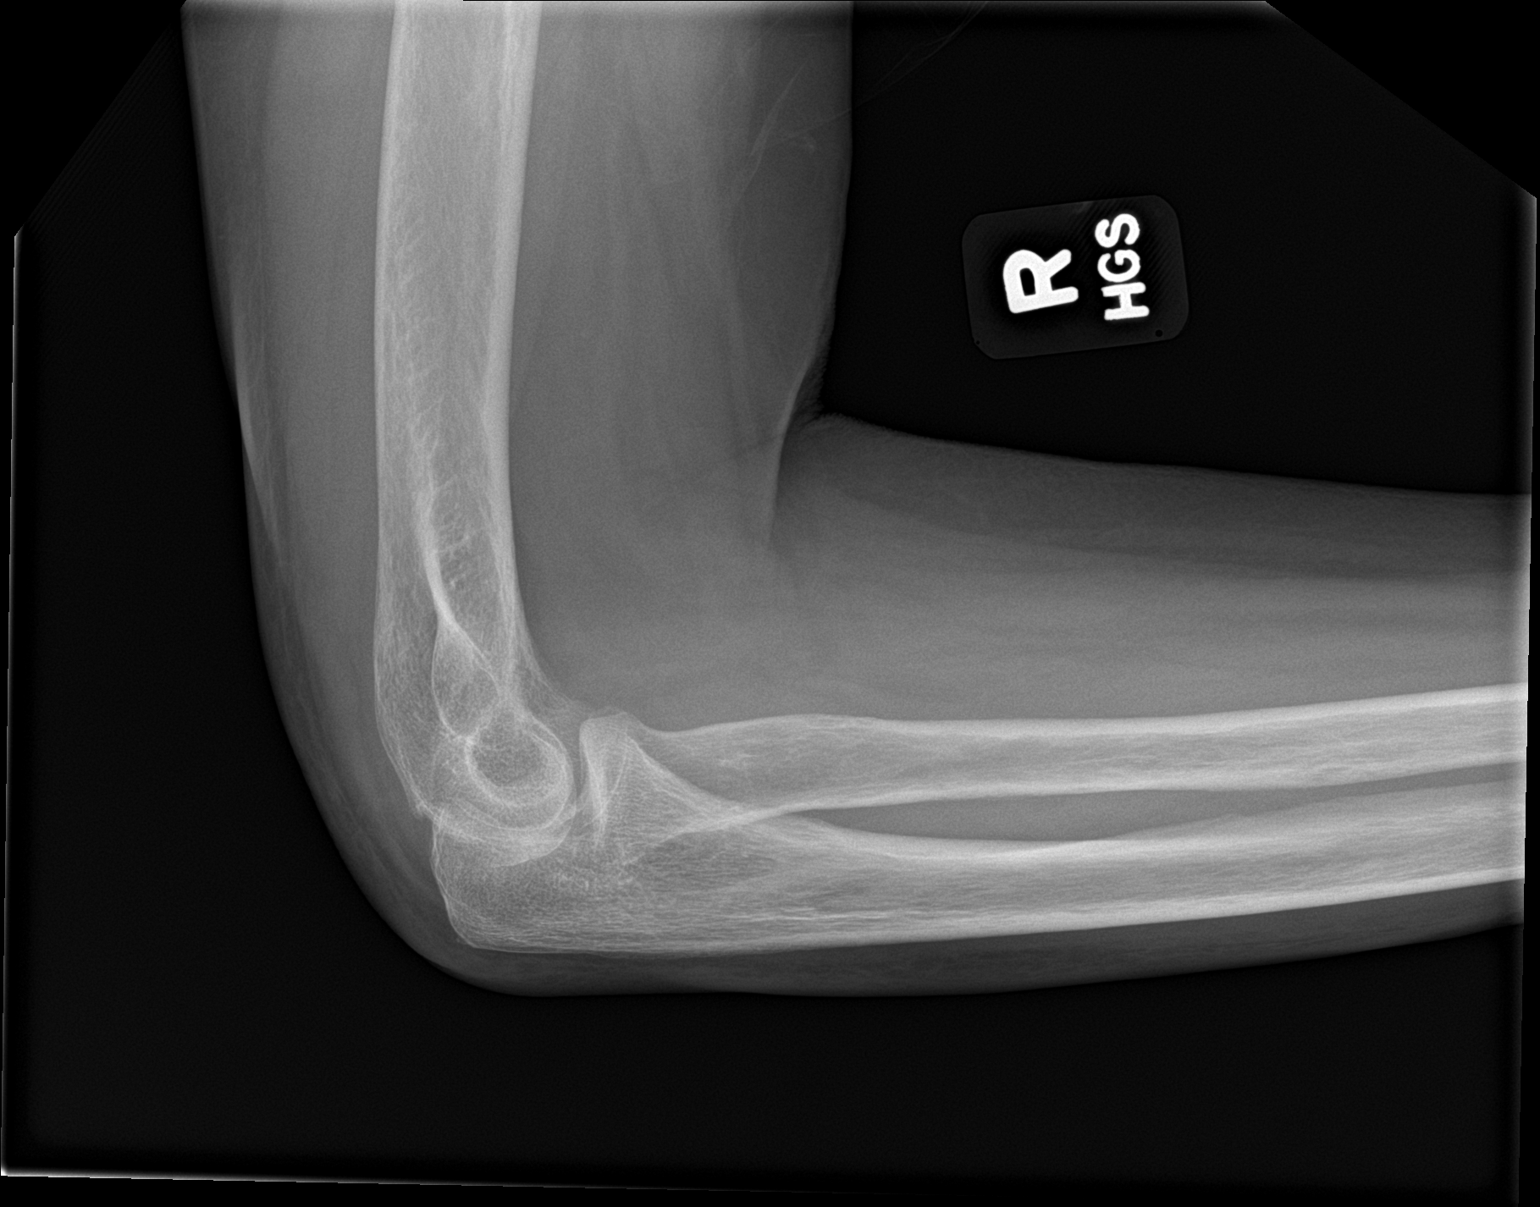

[elbow lat]
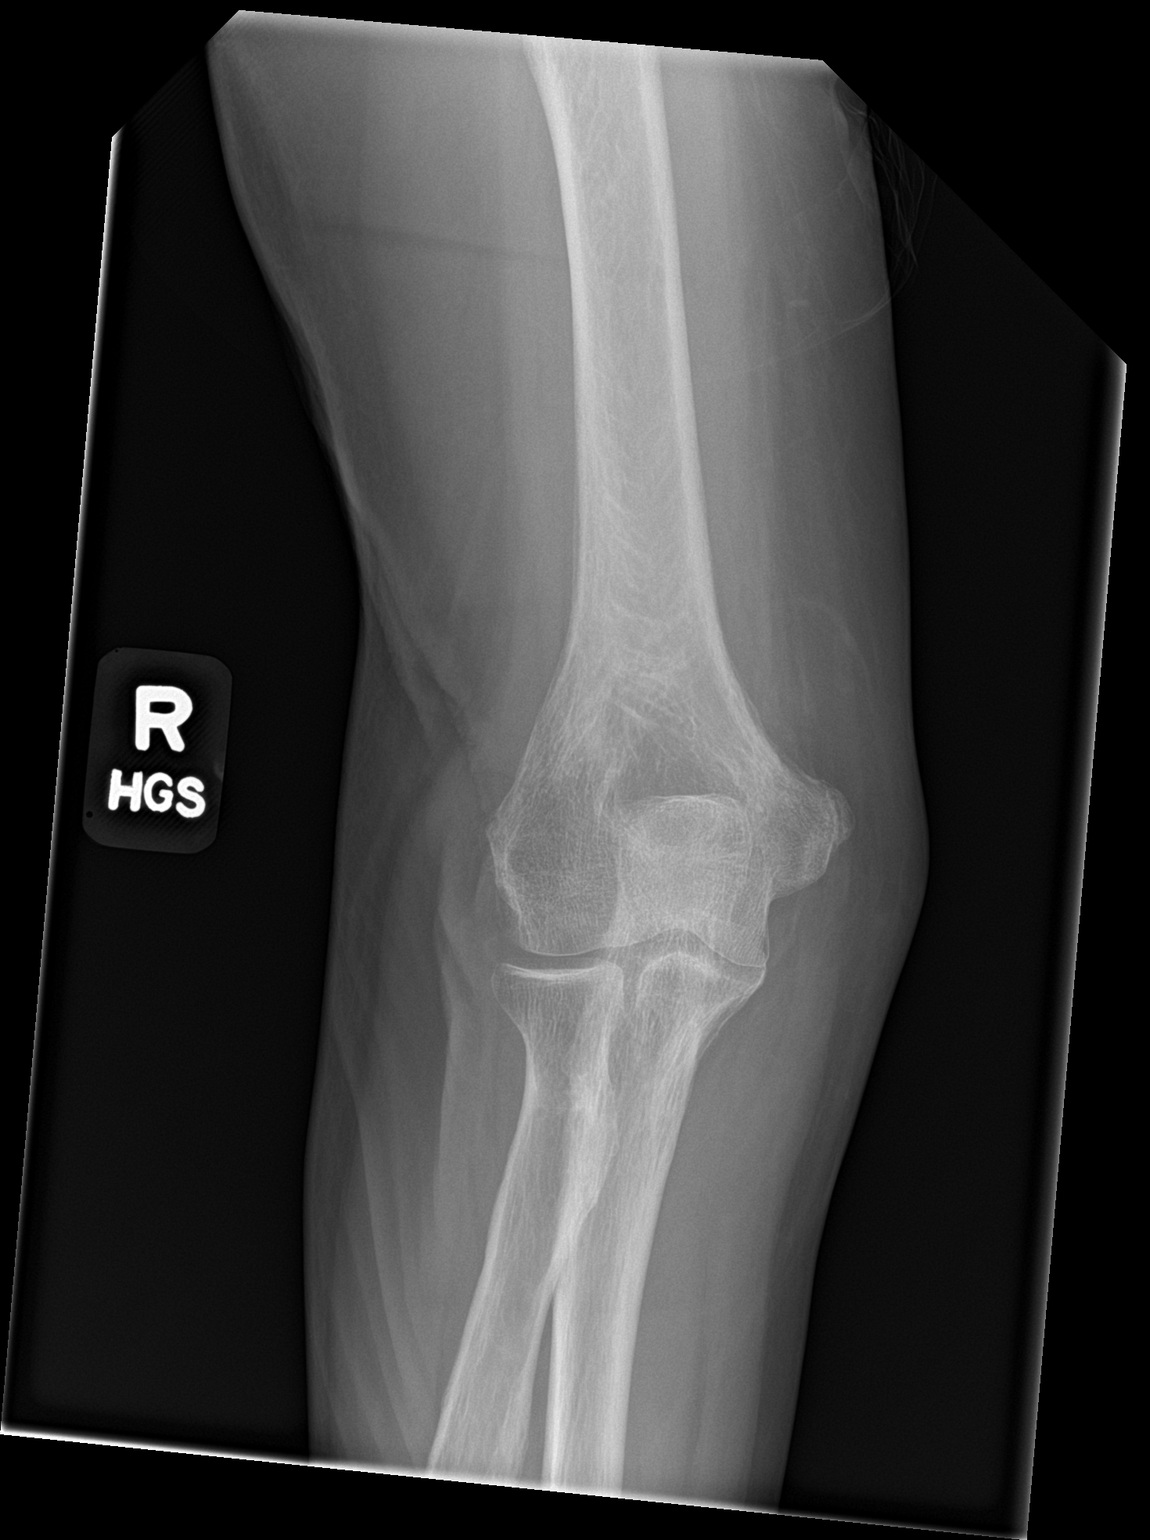

[2 of 2 positions shown; findings below may reference images not displayed]

FINDINGS: There is no acute fracture or dislocation. There is mild osteopenia.
No joint effusion. The soft tissues are unremarkable.
IMPRESSION: No acute fracture or dislocation.

## 2020-03-28 ENCOUNTER — Encounter: Payer: Self-pay | Admitting: Ophthalmology

## 2020-03-29 ENCOUNTER — Encounter: Payer: Self-pay | Admitting: Ophthalmology

## 2020-03-29 ENCOUNTER — Encounter: Payer: Self-pay | Admitting: Anesthesiology

## 2020-04-04 ENCOUNTER — Other Ambulatory Visit: Admission: RE | Admit: 2020-04-04 | Payer: Medicare Other | Source: Ambulatory Visit

## 2020-04-08 ENCOUNTER — Ambulatory Visit: Admission: RE | Admit: 2020-04-08 | Payer: Medicare Other | Source: Home / Self Care | Admitting: Ophthalmology

## 2020-04-08 HISTORY — DX: Pain in unspecified knee: M25.569

## 2020-04-08 HISTORY — DX: Dorsalgia, unspecified: M54.9

## 2020-04-08 SURGERY — PHACOEMULSIFICATION, CATARACT, WITH IOL INSERTION
Anesthesia: Topical | Laterality: Left

## 2020-10-15 ENCOUNTER — Other Ambulatory Visit: Payer: Self-pay | Admitting: Internal Medicine

## 2020-10-15 ENCOUNTER — Ambulatory Visit
Admission: RE | Admit: 2020-10-15 | Discharge: 2020-10-15 | Disposition: A | Discharge status: Home / Self Care | Payer: Medicare Other | Source: Ambulatory Visit | Attending: Internal Medicine | Admitting: Internal Medicine

## 2020-10-15 ENCOUNTER — Other Ambulatory Visit: Payer: Self-pay

## 2020-10-15 DIAGNOSIS — R600 Localized edema: Secondary | ICD-10-CM | POA: Insufficient documentation

## 2020-10-15 DIAGNOSIS — R296 Repeated falls: Secondary | ICD-10-CM | POA: Diagnosis present

## 2020-10-15 DIAGNOSIS — R29898 Other symptoms and signs involving the musculoskeletal system: Secondary | ICD-10-CM

## 2020-10-15 DIAGNOSIS — R251 Tremor, unspecified: Secondary | ICD-10-CM

## 2020-10-15 DIAGNOSIS — R2990 Unspecified symptoms and signs involving the nervous system: Secondary | ICD-10-CM | POA: Insufficient documentation

## 2020-10-15 DIAGNOSIS — R4182 Altered mental status, unspecified: Secondary | ICD-10-CM | POA: Insufficient documentation

## 2021-11-06 ENCOUNTER — Emergency Department: Payer: Medicare Other

## 2021-11-06 ENCOUNTER — Emergency Department
Admission: EM | Admit: 2021-11-06 | Discharge: 2021-11-06 | Disposition: A | Discharge status: Home / Self Care | Payer: Medicare Other | Attending: Emergency Medicine | Admitting: Emergency Medicine

## 2021-11-06 DIAGNOSIS — Z7901 Long term (current) use of anticoagulants: Secondary | ICD-10-CM | POA: Insufficient documentation

## 2021-11-06 DIAGNOSIS — I10 Essential (primary) hypertension: Secondary | ICD-10-CM | POA: Insufficient documentation

## 2021-11-06 DIAGNOSIS — S3991XA Unspecified injury of abdomen, initial encounter: Secondary | ICD-10-CM | POA: Diagnosis present

## 2021-11-06 DIAGNOSIS — W010XXA Fall on same level from slipping, tripping and stumbling without subsequent striking against object, initial encounter: Secondary | ICD-10-CM | POA: Diagnosis not present

## 2021-11-06 DIAGNOSIS — J449 Chronic obstructive pulmonary disease, unspecified: Secondary | ICD-10-CM | POA: Diagnosis not present

## 2021-11-06 DIAGNOSIS — T402X1A Poisoning by other opioids, accidental (unintentional), initial encounter: Secondary | ICD-10-CM | POA: Diagnosis not present

## 2021-11-06 DIAGNOSIS — E871 Hypo-osmolality and hyponatremia: Secondary | ICD-10-CM | POA: Diagnosis not present

## 2021-11-06 DIAGNOSIS — W19XXXA Unspecified fall, initial encounter: Secondary | ICD-10-CM

## 2021-11-06 DIAGNOSIS — S30811A Abrasion of abdominal wall, initial encounter: Secondary | ICD-10-CM | POA: Insufficient documentation

## 2021-11-06 DIAGNOSIS — I2510 Atherosclerotic heart disease of native coronary artery without angina pectoris: Secondary | ICD-10-CM | POA: Insufficient documentation

## 2021-11-06 DIAGNOSIS — E119 Type 2 diabetes mellitus without complications: Secondary | ICD-10-CM | POA: Diagnosis not present

## 2021-11-06 LAB — CBC WITH DIFFERENTIAL/PLATELET
Abs Immature Granulocytes: 0.02 10*3/uL (ref 0.00–0.07)
Basophils Absolute: 0 10*3/uL (ref 0.0–0.1)
Basophils Relative: 0 %
Eosinophils Absolute: 0 10*3/uL (ref 0.0–0.5)
Eosinophils Relative: 1 %
HCT: 38.4 % (ref 36.0–46.0)
Hemoglobin: 12.6 g/dL (ref 12.0–15.0)
Immature Granulocytes: 0 %
Lymphocytes Relative: 31 %
Lymphs Abs: 2 10*3/uL (ref 0.7–4.0)
MCH: 30.7 pg (ref 26.0–34.0)
MCHC: 32.8 g/dL (ref 30.0–36.0)
MCV: 93.7 fL (ref 80.0–100.0)
Monocytes Absolute: 0.5 10*3/uL (ref 0.1–1.0)
Monocytes Relative: 8 %
Neutro Abs: 3.8 10*3/uL (ref 1.7–7.7)
Neutrophils Relative %: 60 %
Platelets: 249 10*3/uL (ref 150–400)
RBC: 4.1 MIL/uL (ref 3.87–5.11)
RDW: 12.5 % (ref 11.5–15.5)
WBC: 6.4 10*3/uL (ref 4.0–10.5)
nRBC: 0 % (ref 0.0–0.2)

## 2021-11-06 LAB — BASIC METABOLIC PANEL
Anion gap: 12 (ref 5–15)
BUN: 16 mg/dL (ref 8–23)
CO2: 26 mmol/L (ref 22–32)
Calcium: 8.9 mg/dL (ref 8.9–10.3)
Chloride: 89 mmol/L — ABNORMAL LOW (ref 98–111)
Creatinine, Ser: 0.86 mg/dL (ref 0.44–1.00)
GFR, Estimated: >60 mL/min (ref >60)
Glucose, Bld: 120 mg/dL — ABNORMAL HIGH (ref 70–99)
Potassium: 4.9 mmol/L (ref 3.5–5.1)
Sodium: 127 mmol/L — ABNORMAL LOW (ref 135–145)

## 2021-11-06 MED ORDER — SODIUM CHLORIDE 0.9 % IV BOLUS
1000.0000 mL | Freq: Once | INTRAVENOUS | Status: AC
  Administered 2021-11-06: 1000 mL via INTRAVENOUS

## 2021-11-06 NOTE — Discharge Instructions (Signed)
Your sodium was slightly low.  Please have this rechecked in about 1 week.  You did have an opiate overdose today.  Please only take 20 mg of the oxycodone up to 4 times per day.  Please follow-up with your pain specialist.

## 2021-11-06 NOTE — ED Notes (Signed)
Ems gave patient 1 mg narcan

## 2021-11-06 NOTE — ED Provider Notes (Signed)
[facility] Provider Note    Event Date/Time   First MD Initiated Contact with Patient 11/06/21 [phone]     (approximate)   History   Fall (Fall, per ems pts husband stated oxy usage , narcan 1 mg pt became more alert , pt has buttox pain from fall, no hit head , pt on thinner?? , no loc )   HPI  Amy Sweeney is a 71 y.o. female with past medical history of coronary artery disease, bipolar disorder, diabetes, pulmonary embolism was, chronic opiate use who presents after a fall.  Patient tells me that she got up to use the bathroom this evening and subsequently lost her balance fell backwards directly onto her buttocks.  She did hit her right shoulder she denies hitting her head.  She denies losing consciousness.  She was unable to get up on her own but was able to slide over to her bed and take 40 mg of oxycodone which she says is her normal dose.  Per EMS when they initially arrived she was mentating normally.  Upon moving into the ambulance she became altered with agonal respirations and required 1 mg of Narcan after which her mental status and respirations improved.  She was also transiently hypotensive but this resolved with 500 cc of fluid.    Past Medical History:  Diagnosis Date   Anxiety    Arthritis    degenerative artgritis  osteoarthritis   Back pain    Bipolar 1 disorder (HCC)    Bipolar disorder (HCC)    COPD (chronic obstructive pulmonary disease) (HCC)    hx pulmonary embolism   Coronary artery disease    stent x 1   DDD (degenerative disc disease), cervical    DDD (degenerative disc disease), cervical    Depression    Diabetes mellitus without complication ([place])    Pt takes metformin.    Diverticulitis    Diverticulosis    Dyspnea    Family history of adverse reaction to anesthesia    father blood clot after surgery died   Headache    history of migraines   History of blood transfusion    History of pulmonary embolus (PE) 2016    History of shingles    40 years ago   Hypertension    Knee pain    Lower extremity deep venous thrombosis (HCC)    Lower extremity edema    Multiple closed fractures involving multiple regions of single lower extremity with nonunion, subsequent encounter    Night terrors    OCD (obsessive compulsive disorder)    Osteoarthritis    Osteoporosis    Pneumonia    bronchitis, actually   PTSD (post-traumatic stress disorder)    Vertigo 5 yrs ago   Vitamin B 12 deficiency     Patient Active Problem List   Diagnosis Date Noted   Closed right hip fracture ([place]) 10/03/2018   Chest pain at rest 03/03/2018   Hypertension, essential 03/03/2018   Closed fracture of proximal tibia 02/22/2018   DDD (degenerative disc disease), cervical 02/22/2018   Chronic anticoagulation (Eliqu[place]) 02/22/2018   History of hip replacement (Left) 02/22/2018   Chronic pain syndrome 02/22/2018   Pain medication agreement (w/ [facility]) 02/22/2018   Type 2 diabetes mellitus without complication, without long-term current use of insulin ([place]) 11/30/2017   Abdominal wall bulge 11/10/2017   Age-related osteoporosis without current pathological fracture 09/22/2017   Metformin overdose 04/01/2017   COPD (chronic obstructive  pulmonary disease) ([place]) 04/01/2017   Encounter for long-term opiate analgesic use 12/15/2016   Psychophysiological insomnia 11/20/2016   Bipolar depression ([place]) 11/20/2016   Back muscle spasm 10/08/2016   Hypercoagulable state ([place]) 09/28/2016   DJD (degenerative joint disease) 09/28/2016   History of DVT (deep vein thrombosis) 07/16/2016   Osteoarthritis of hip (Right) 06/18/2016   Chronic hip pain (Right) 05/08/2016   Anxiety state 04/14/2016   Chronic bilateral thoracic back pain 04/14/2016   Acute right ankle pain 03/12/2016   Traumatic arthritis of knee 05/30/2015   Bipolar 1 disorder, mixed, moderate ([place]) 10/26/2014   Post-traumatic osteoarthritis of one knee 08/23/2014    Lumbar radicular pain 05/31/2014   Cervical neck pain with evidence of disc disease 04/24/2014   Chronic low back pain 04/24/2014   Bilateral chronic knee pain 04/24/2014   Leg pain 03/30/2014   Pulmonary embolism on right ([place]) 02/15/2014   Tibial plateau fracture 01/16/2014   Diabetes ([place]) 02/20/2013   Bipolar disorder ([place]) 02/20/2013   Cervical disc disease 02/20/2013   Chronic, continuous use of opioids 02/20/2013   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 --      Excl. in [place]? --     Most recent vital signs: Vitals:   11/06/21 0727 11/06/21 0729  BP: (!) 146/84   Pulse: 95   Resp:  17  Temp: 98.5 F (36.9 C)   SpO2: 98%      General: Awake, no distress.  CV:  Good peripheral perfusion.  Resp:  Normal effort.  Abd:  No distention. Soft and non-tender Neuro:             Awake, Alert, Oriented x 3  Other:  No midline C, T or L-spine tenderness, tenderness to palpation over the coccyx Superficial abrasion over the right flank and right glutes No pelvic tenderness, pelvis is stable Normal range of motion of the bilateral hips without associated pain 5 strength with plantarflexion dorsiflexion Plus DP pulses bilaterally   ED Results / Procedures / Treatments  Labs (all labs ordered are listed, but only abnormal results are displayed) Labs Reviewed  BASIC METABOLIC PANEL - Abnormal; Notable for the following components:      Result Value   Sodium 127 (*)     Chloride 89 (*)    Glucose, Bld 120 (*)    All other components within normal limits  CBC WITH DIFFERENTIAL/PLATELET     EKG  EKG interpreted by myself, low voltage, normal axis normal intervals no acute ischemic changes   RADIOLOGY I reviewed the CXR which does not show any acute cardiopulmonary process  I reviewed the x-ray the right shoulder which has no  acute findings  I reviewed the x-ray of the pelvis which shows bilateral hip replacements but no acute fracture   PROCEDURES:  Critical Care performed: No  .1-3 Lead EKG Interpretation Performed by: Rada Hay, MD Authorized by: Rada Hay, MD     Interpretation: normal     ECG rate assessment: normal     Rhythm: sinus rhythm     Ectopy: none     Conduction: normal    The patient is on the cardiac monitor to evaluate for evidence of arrhythmia and/or significant heart rate changes.   MEDICATIONS ORDERED IN ED: Medications  sodium chloride 0.9 % bolus 1,000 mL (1,000 mLs Intravenous New Bag/Given 11/06/21 0846)     IMPRESSION / MDM / ASSESSMENT AND PLAN / ED COURSE  I reviewed the triage vital signs and the nursing notes.                              Differential diagnosis includes, but is not limited to, lumbar spine fracture, musculoskeletal pain, contusion, opiate overdose  Patient is a 71 year old female with chronic pain who is on chronic opioids, history of PE on Eliquis who presents after a fall.  Patient had a mechanical fall onto her buttock earlier today.  When EMS arrived she was initially mentating normally but then was noted to have decreased mental status and agonal respirations after being transferred ported to the ambulance.  She required 1 mg Narcan and her mental status significantly improved afterward.  On arrival to the ED patient complaining of pain in the buttock and low back.  No acute neurologic symptoms.  She also complains of right shoulder pain but this is a chronic issue  for her.  She did not hit her head.  Denies neck pain.  On exam she has some superficial abrasions over the right glutes and right flank but is really not tender and is not tender in the lumbar midline.  She has full strength and sensation in her lower extremities and she is able to ambulate.  Obtained a CT of the head and neck which are negative for acute pathology.  Also x-ray of the pelvis chest right shoulder which are negative for fracture reviewed by myself.  Also obtained a CT of the lumbar spine which is negative for acute fracture, there are chronic compression fractures.  I had a long discussion with the patient and her husband about the opiate overdose today and cautioned her to only take half of the prescribed oxycodone.  Unclear why this pushed her over the edge today.  Her creatinine is unchanged from prior.  Chest x-ray does show some atelectasis but there is no pulmonary edema and she is not hypoxic.  Her CBC and BMP are otherwise notable for hyponatremia to 127.  She has been hyponatremic in the past before.  She has dry mucous membranes that she was given a liter of fluid.  Discussed that she should have this checked in again about 1 week.  Patient was aware of her chronic hyponatremia.  Overall given no acute injuries and she is ambulatory I think she is appropriate for discharge.  Patient has Narcan at home.      FINAL CLINICAL IMPRESSION(S) / ED DIAGNOSES   Final diagnoses:  Opioid overdose, accidental or unintentional, initial encounter [facility])  Fall, initial encounter  Hyponatremia     Rx / DC Orders   ED Discharge Orders  None        Note:  This document was prepared using Dragon voice recognition software and may include unintentional dictation errors.   Rada Hay, MD 11/06/21 [phone]

## 2021-11-06 NOTE — ED Triage Notes (Signed)
Fall, per ems pts husband stated oxy usage , narcan 1 mg pt became more alert , pt has buttox pain from fall, no hit head , thinners??? , no loc

## 2021-12-17 ENCOUNTER — Emergency Department: Payer: Medicare Other

## 2021-12-17 ENCOUNTER — Encounter: Payer: Self-pay | Admitting: Emergency Medicine

## 2021-12-17 ENCOUNTER — Emergency Department
Admission: EM | Admit: 2021-12-17 | Discharge: 2021-12-18 | Disposition: A | Discharge status: Home / Self Care | Payer: Medicare Other | Attending: Emergency Medicine | Admitting: Emergency Medicine

## 2021-12-17 ENCOUNTER — Other Ambulatory Visit: Payer: Self-pay

## 2021-12-17 DIAGNOSIS — Z7901 Long term (current) use of anticoagulants: Secondary | ICD-10-CM | POA: Diagnosis not present

## 2021-12-17 DIAGNOSIS — G8929 Other chronic pain: Secondary | ICD-10-CM | POA: Diagnosis not present

## 2021-12-17 DIAGNOSIS — J449 Chronic obstructive pulmonary disease, unspecified: Secondary | ICD-10-CM | POA: Diagnosis not present

## 2021-12-17 DIAGNOSIS — E119 Type 2 diabetes mellitus without complications: Secondary | ICD-10-CM | POA: Insufficient documentation

## 2021-12-17 DIAGNOSIS — F1110 Opioid abuse, uncomplicated: Secondary | ICD-10-CM | POA: Diagnosis not present

## 2021-12-17 DIAGNOSIS — I10 Essential (primary) hypertension: Secondary | ICD-10-CM | POA: Diagnosis not present

## 2021-12-17 DIAGNOSIS — W19XXXA Unspecified fall, initial encounter: Secondary | ICD-10-CM

## 2021-12-17 DIAGNOSIS — Z86718 Personal history of other venous thrombosis and embolism: Secondary | ICD-10-CM | POA: Diagnosis not present

## 2021-12-17 DIAGNOSIS — Y92812 Truck as the place of occurrence of the external cause: Secondary | ICD-10-CM | POA: Insufficient documentation

## 2021-12-17 DIAGNOSIS — E871 Hypo-osmolality and hyponatremia: Secondary | ICD-10-CM | POA: Diagnosis not present

## 2021-12-17 DIAGNOSIS — S299XXA Unspecified injury of thorax, initial encounter: Secondary | ICD-10-CM | POA: Insufficient documentation

## 2021-12-17 DIAGNOSIS — M25531 Pain in right wrist: Secondary | ICD-10-CM | POA: Insufficient documentation

## 2021-12-17 DIAGNOSIS — S0990XA Unspecified injury of head, initial encounter: Secondary | ICD-10-CM | POA: Insufficient documentation

## 2021-12-17 DIAGNOSIS — W01198A Fall on same level from slipping, tripping and stumbling with subsequent striking against other object, initial encounter: Secondary | ICD-10-CM | POA: Diagnosis not present

## 2021-12-17 DIAGNOSIS — Z79891 Long term (current) use of opiate analgesic: Secondary | ICD-10-CM | POA: Insufficient documentation

## 2021-12-17 DIAGNOSIS — S3991XA Unspecified injury of abdomen, initial encounter: Secondary | ICD-10-CM | POA: Diagnosis not present

## 2021-12-17 DIAGNOSIS — F1190 Opioid use, unspecified, uncomplicated: Secondary | ICD-10-CM

## 2021-12-17 LAB — COMPREHENSIVE METABOLIC PANEL
ALT: 15 U/L (ref 0–44)
AST: 21 U/L (ref 15–41)
Albumin: 3.3 g/dL — ABNORMAL LOW (ref 3.5–5.0)
Alkaline Phosphatase: 62 U/L (ref 38–126)
Anion gap: 10 (ref 5–15)
BUN: 12 mg/dL (ref 8–23)
CO2: 25 mmol/L (ref 22–32)
Calcium: 8.3 mg/dL — ABNORMAL LOW (ref 8.9–10.3)
Chloride: 89 mmol/L — ABNORMAL LOW (ref 98–111)
Creatinine, Ser: 0.7 mg/dL (ref 0.44–1.00)
GFR, Estimated: >60 mL/min (ref >60)
Glucose, Bld: 122 mg/dL — ABNORMAL HIGH (ref 70–99)
Potassium: 5.3 mmol/L — ABNORMAL HIGH (ref 3.5–5.1)
Sodium: 124 mmol/L — ABNORMAL LOW (ref 135–145)
Total Bilirubin: 0.6 mg/dL (ref 0.3–1.2)
Total Protein: 7.3 g/dL (ref 6.5–8.1)

## 2021-12-17 LAB — CBC WITH DIFFERENTIAL/PLATELET
Abs Immature Granulocytes: 0.04 10*3/uL (ref 0.00–0.07)
Basophils Absolute: 0 10*3/uL (ref 0.0–0.1)
Basophils Relative: 0 %
Eosinophils Absolute: 0 10*3/uL (ref 0.0–0.5)
Eosinophils Relative: 0 %
HCT: 41.2 % (ref 36.0–46.0)
Hemoglobin: 13.2 g/dL (ref 12.0–15.0)
Immature Granulocytes: 1 %
Lymphocytes Relative: 27 %
Lymphs Abs: 1.7 10*3/uL (ref 0.7–4.0)
MCH: 30.3 pg (ref 26.0–34.0)
MCHC: 32 g/dL (ref 30.0–36.0)
MCV: 94.5 fL (ref 80.0–100.0)
Monocytes Absolute: 0.5 10*3/uL (ref 0.1–1.0)
Monocytes Relative: 7 %
Neutro Abs: 4.2 10*3/uL (ref 1.7–7.7)
Neutrophils Relative %: 65 %
Platelets: 249 10*3/uL (ref 150–400)
RBC: 4.36 MIL/uL (ref 3.87–5.11)
RDW: 13.5 % (ref 11.5–15.5)
WBC: 6.4 10*3/uL (ref 4.0–10.5)
nRBC: 0 % (ref 0.0–0.2)

## 2021-12-17 LAB — PROTIME-INR
INR: 1 (ref 0.8–1.2)
Prothrombin Time: 12.9 seconds (ref 11.4–15.2)

## 2021-12-17 LAB — ETHANOL: Alcohol, Ethyl (B): <10 mg/dL (ref <10)

## 2021-12-17 LAB — VALPROIC ACID LEVEL: Valproic Acid Lvl: 75 ug/mL (ref 50.0–100.0)

## 2021-12-17 MED ORDER — KETOROLAC TROMETHAMINE 30 MG/ML IJ SOLN
15.0000 mg | Freq: Once | INTRAMUSCULAR | Status: AC
  Administered 2021-12-17: 15 mg via INTRAVENOUS
  Filled 2021-12-17: qty 1

## 2021-12-17 MED ORDER — LIDOCAINE 5 % EX PTCH
1.0000 | MEDICATED_PATCH | CUTANEOUS | Status: DC
  Administered 2021-12-17: 1 via TRANSDERMAL
  Filled 2021-12-17: qty 1

## 2021-12-17 MED ORDER — IOHEXOL 300 MG/ML  SOLN
100.0000 mL | Freq: Once | INTRAMUSCULAR | Status: AC | PRN
  Administered 2021-12-17: 100 mL via INTRAVENOUS

## 2021-12-17 NOTE — ED Notes (Signed)
Dr. Larinda Buttery at bedside speaking to pt at this time  ? ?

## 2021-12-17 NOTE — ED Notes (Signed)
Patient transported to CT 

## 2021-12-17 NOTE — ED Notes (Signed)
EDP at bedside  

## 2021-12-17 NOTE — ED Notes (Signed)
Pt is not on o2 at home. Pt was in the high 80s per ems and was placed on 4l/o2 ?

## 2021-12-17 NOTE — ED Triage Notes (Signed)
Pt fell stepping into trunk and hit face denies any LOC. When EMS affrived pt was A&O x 4 answering all questions appropriately. EMT stated she stepped away for less than a minute and when she started to talk to pt again her speech was unclear and she was not answering questions appropriately and unable to repeat sentences. Pt complains for right knee pain and right wrist pain. Pt states she pain is a 6/10. Pt is alert and oriented to self, date and place. Pt unable to tell current president.  ?

## 2021-12-17 NOTE — ED Provider Notes (Signed)
? ?[facility] ?Provider Note ? ? ? Event Date/Time  ? First MD Initiated Contact with Patient 12/17/21 1837   ?  (approximate) ? ? ?History  ? ?Chief Complaint ?Fall ? ? ?HPI ? ?Amy Sweeney is a 71 y.o. female with past medical history of hypertension, diabetes, COPD, DVT/PE on Eliquis, chronic pain syndrome, and bipolar disorder who presents to the ED following fall.  History is limited due to patient's confusion and majority of history is obtained from EMS.  They state that patient was attempting to step up into her truck when she lost her balance and fell forward.  She reportedly struck her right side as well as her head, but EMS states she did not lose consciousness.  EMS states that on their arrival patient was alert and oriented, answering questions appropriately.  They state that during transport, patient became less lucid with more difficulty answering questions and frequently repeating herself.  On arrival to the ED, patient complains of right wrist pain, otherwise denies any complaints.  Patient denies alcohol or drug use, does states she takes medication for anxiety and her bipolar disorder, but is unable to state the medication.  She denies any headache, neck pain, chest pain, or abdominal pain.  Husband at bedside does report that patient regularly takes oxycodone for pain and had taken her usual dose about 2 minutes before the fall. ?  ? ? ?Physical Exam  ? ?Triage Vital Signs: ?ED Triage Vitals  ?Enc Vitals Group  ?   BP 12/17/21 1839 123/84  ?   Pulse Rate 12/17/21 1839 91  ?   Resp 12/17/21 1839 12  ?   Temp 12/17/21 1839 98.3 ?F (36.8 ?C)  ?   Temp Source 12/17/21 1839 Oral  ?   SpO2 12/17/21 1839 93 %  ?   Weight 12/17/21 1841 238 lb 1.6 oz (108 kg)  ?   Height --   ?   Head Circumference --   ?   Peak Flow --   ?   Pain Score 12/17/21 1841 6  ?   Pain Loc --   ?   Pain Edu? --   ?   Excl. in GC? --   ? ? ?Most recent vital signs: ?Vitals:  ? 12/17/21 2301 12/17/21  2330  ?BP:  120/70  ?Pulse:  79  ?Resp:  13  ?Temp:    ?SpO2: 95% 94%  ? ? ?Constitutional: Somnolent but arousable to voice, oriented to person, place, and time but not situation. ?Eyes: Conjunctivae are normal.  Pupils equal, round, and reactive to light bilaterally. ?Head: Atraumatic. ?Nose: No congestion/rhinnorhea. ?Mouth/Throat: Mucous membranes are moist.  ?Neck: No midline cervical spine tenderness to palpation. ?Cardiovascular: Normal rate, regular rhythm. Grossly normal heart sounds.  2+ radial pulses bilaterally. ?Respiratory: Normal respiratory effort.  No retractions. Lungs CTAB.  No chest wall tenderness to palpation. ?Gastrointestinal: Soft and nontender. No distention. ?Musculoskeletal: No lower extremity tenderness nor edema.  Diffuse tenderness to palpation noted at right wrist, otherwise no upper extremity bony tenderness to palpation. ?Neurologic: Slurred speech noted. No gross focal neurologic deficits are appreciated. ? ? ? ?ED Results / Procedures / Treatments  ? ?Labs ?(all labs ordered are listed, but only abnormal results are displayed) ?Labs Reviewed  ?COMPREHENSIVE METABOLIC PANEL - Abnormal; Notable for the following components:  ?    Result Value  ? Sodium 124 (*)   ? Potassium 5.3 (*)   ? Chloride 89 (*)   ?  Glucose, Bld 122 (*)   ? Calcium 8.3 (*)   ? Albumin 3.3 (*)   ? All other components within normal limits  ?CBC WITH DIFFERENTIAL/PLATELET  ?PROTIME-INR  ?ETHANOL  ?VALPROIC ACID LEVEL  ?URINE DRUG SCREEN, QUALITATIVE ([facility] ONLY)  ? ? ? ?EKG ? ?ED ECG REPORT ?Harriet Masson, the attending physician, personally viewed and interpreted this ECG. ? ? Date: 12/17/2021 ? EKG Time: 18:33 ? Rate: 94 ? Rhythm: normal sinus rhythm ? Axis: Normal ? Intervals:none ? ST&T Change: None ? ?RADIOLOGY ?CT head reviewed by me with no hemorrhage or midline shift.  X-rays of right wrist reviewed by me with no fracture or dislocation. ? ?PROCEDURES: ? ?Critical Care performed: Yes, see critical  care procedure note(s) ? ?.Critical Care ?Performed by: Chesley Noon, MD ?Authorized by: Chesley Noon, MD  ? ?Critical care provider statement:  ?  Critical care time (minutes):  45 ?  Critical care time was exclusive of:  Separately billable procedures and treating other patients and teaching time ?  Critical care was necessary to treat or prevent imminent or life-threatening deterioration of the following conditions:  Respiratory failure and trauma ?  Critical care was time spent personally by me on the following activities:  Development of treatment plan with patient or surrogate, discussions with consultants, evaluation of patient's response to treatment, examination of patient, ordering and review of laboratory studies, ordering and review of radiographic studies, ordering and performing treatments and interventions, pulse oximetry, re-evaluation of patient's condition and review of old charts ?  I assumed direction of critical care for this patient from another provider in my specialty: no   ? ? ?MEDICATIONS ORDERED IN ED: ?Medications  ?lidocaine (LIDODERM) 5 % 1 patch (1 patch Transdermal Patch Applied 12/17/21 2207)  ?iohexol (OMNIPAQUE) 300 MG/ML solution 100 mL (100 mLs Intravenous Contrast Given 12/17/21 1858)  ?ketorolac (TORADOL) 30 MG/ML injection 15 mg (15 mg Intravenous Given 12/17/21 2208)  ? ? ? ?IMPRESSION / MDM / ASSESSMENT AND PLAN / ED COURSE  ?I reviewed the triage vital signs and the nursing notes. ?             ?               ? ?71 y.o. female with past medical history of hypertension, diabetes, DVT/PE on Eliquis, COPD, chronic pain syndrome, and bipolar disorder who presents to the ED following a trip and fall while getting into her truck with concern for altered mental status. ? ?Differential diagnosis includes, but is not limited to, intracranial hemorrhage, cervical spine injury, thoracic injury, intra-abdominal injury, wrist fracture, hip fracture, opiate overdose, Depakote toxicity,  alcohol intoxication. ? ?Patient's somnolent but easily arousable on arrival with no focal neurologic deficits.  Given she is anticoagulated on Eliquis with limited history and significant traumatic mechanism, we will check CT head, cervical spine, and chest/abdomen/pelvis.  I would also consider that her altered mental status is secondary to opiate intoxication given her long history of opiate use including recent ED visit for opiate overdose.  She is requiring 4 L of supplemental oxygen at this time and if she were to develop worsening respiratory depression, would consider Narcan use. ? ?CT imaging of head, cervical spine, and chest/abdomen/pelvis is negative for acute traumatic injury or other acute process.  Labs are remarkable for hyponatremia at 124, although this appears to be chronic for patient with sodium level of 127 two months ago.  Patient admits that she consumes a significant amount of  water and follows with her PCP due to issues with hyponatremia.  Given chronicity of this issue, I do not feel it warrants admission and patient was counseled on water restriction along with close follow-up with her PCP for recheck of her sodium levels.  At this point, her change in mental status appears due to opiate intoxication and mental status has been gradually improving here in the ED.  She has been weaned off of oxygen and is maintaining O2 sats on room air, is requesting additional pain medication however she was counseled that this was likely contributing factor in her fall and I am concerned that she is taking too much of her chronic pain meds.  She was counseled to attempt to cut back on this and to follow-up with her pain management providers.  She is requesting to be discharged home and was counseled to return to the ED for new or worsening symptoms.  Patient agrees with plan. ? ?  ? ? ?FINAL CLINICAL IMPRESSION(S) / ED DIAGNOSES  ? ?Final diagnoses:  ?Fall, initial encounter  ?Opiate misuse   ?Hyponatremia  ? ? ? ?Rx / DC Orders  ? ?ED Discharge Orders   ? ? None  ? ?  ? ? ? ?Note:  This document was prepared using Dragon voice recognition software and may include unintentional dictation errors. ?  ?Daevion Navarette

## 2021-12-18 NOTE — ED Notes (Signed)
E-signature pad unavailable - Pt verbalized understanding of D/C information - no additional concerns at this time.  

## 2021-12-26 ENCOUNTER — Other Ambulatory Visit: Payer: Self-pay | Admitting: Student

## 2021-12-26 DIAGNOSIS — R569 Unspecified convulsions: Secondary | ICD-10-CM

## 2021-12-29 ENCOUNTER — Other Ambulatory Visit: Payer: Self-pay | Admitting: Student

## 2021-12-29 DIAGNOSIS — R569 Unspecified convulsions: Secondary | ICD-10-CM

## 2021-12-29 DIAGNOSIS — R4789 Other speech disturbances: Secondary | ICD-10-CM

## 2022-01-10 ENCOUNTER — Ambulatory Visit
Admission: RE | Admit: 2022-01-10 | Discharge: 2022-01-10 | Disposition: A | Discharge status: Home / Self Care | Payer: Medicare Other | Source: Ambulatory Visit | Attending: Student | Admitting: Student

## 2022-01-10 DIAGNOSIS — R569 Unspecified convulsions: Secondary | ICD-10-CM

## 2022-01-10 DIAGNOSIS — R4789 Other speech disturbances: Secondary | ICD-10-CM

## 2022-05-13 ENCOUNTER — Emergency Department
Admission: EM | Admit: 2022-05-13 | Discharge: 2022-05-13 | Discharge status: Against Medical Advice | Payer: Medicare Other | Attending: Emergency Medicine | Admitting: Emergency Medicine

## 2022-05-13 DIAGNOSIS — Z5321 Procedure and treatment not carried out due to patient leaving prior to being seen by health care provider: Secondary | ICD-10-CM | POA: Insufficient documentation

## 2022-05-13 LAB — CBG MONITORING, ED: Glucose-Capillary: 82 mg/dL (ref 70–99)

## 2022-06-14 DEATH — deceased
